# Patient Record
Sex: Female | Born: 1955 | Race: White | Hispanic: No | Marital: Single | Smoking: Never smoker
Health system: Southern US, Community
[De-identification: ages and names within clinical notes are randomized; demographics above are authoritative.]

## PROBLEM LIST (undated history)

## (undated) DIAGNOSIS — R4589 Other symptoms and signs involving emotional state: Secondary | ICD-10-CM

## (undated) DIAGNOSIS — C50919 Malignant neoplasm of unspecified site of unspecified female breast: Secondary | ICD-10-CM

## (undated) DIAGNOSIS — F418 Other specified anxiety disorders: Secondary | ICD-10-CM

## (undated) DIAGNOSIS — R918 Other nonspecific abnormal finding of lung field: Secondary | ICD-10-CM

## (undated) HISTORY — DX: Malignant neoplasm of unspecified site of unspecified female breast: C50.919

## (undated) HISTORY — PX: MASTECTOMY: SHX3

## (undated) HISTORY — PX: EYE SURGERY: SHX253

---

## 2014-04-29 ENCOUNTER — Encounter: Payer: Self-pay | Admitting: Internal Medicine

## 2014-04-29 ENCOUNTER — Encounter (INDEPENDENT_AMBULATORY_CARE_PROVIDER_SITE_OTHER): Payer: Self-pay

## 2014-04-29 ENCOUNTER — Ambulatory Visit (INDEPENDENT_AMBULATORY_CARE_PROVIDER_SITE_OTHER)
Admission: RE | Admit: 2014-04-29 | Discharge: 2014-04-29 | Disposition: A | Discharge status: Home / Self Care | Payer: Self-pay | Source: Ambulatory Visit | Attending: Internal Medicine | Admitting: Internal Medicine

## 2014-04-29 ENCOUNTER — Ambulatory Visit (INDEPENDENT_AMBULATORY_CARE_PROVIDER_SITE_OTHER): Payer: Self-pay | Admitting: Internal Medicine

## 2014-04-29 VITALS — BP 106/70 | HR 76 | Ht 62.0 in | Wt 106.0 lb

## 2014-04-29 DIAGNOSIS — R0600 Dyspnea, unspecified: Secondary | ICD-10-CM | POA: Insufficient documentation

## 2014-04-29 NOTE — Progress Notes (Signed)
Subjective:     Patient ID: Patricia Carter, female   DOB: 30-Jul-1955,    MRN: [id number]  HPI  65 yowf never smoker s/p L mastectomy 2010 no adjuvant therapy they recommended both but didn't complete it new onset cough 08/2013 indolent onset daily since [facility] dx bronchitis abx/ pred and no better and gradually worse so self referred  To pulmonary clinic 04/29/2014    04/29/2014 [facility]st [facility] office visit/ Teka Chanda   Chief Complaint  Patient presents with  . Pulmonary Consult    Self referral. Pt c/o SOB since May 2015. Pt states that she is SOB with or without any exertion- worse with "too much exercise" and anxiety.    indolent onset persistent daily cough x 8 m then sob x 6 m, last saba 12 h prior to OV  "couln't lie down s sense of choking" saba helps some  Not bringing up excess mucus and no h/o hemoptysis  No obvious day to day or daytime variabilty or assoc  cp or chest tightness, subjective wheeze overt sinus or hb symptoms. No unusual exp hx or h/o childhood pna/ asthma or knowledge of premature birth.  Sleeping ok without nocturnal  or early am exacerbation  of respiratory  c/o's or need for noct saba. Also denies any obvious fluctuation of symptoms with weather or environmental changes or other aggravating or alleviating factors except as outlined above   Current Medications, Allergies, Complete Past Medical History, Past Surgical History, Family History, and Social History were reviewed in [employer] record.  ROS  The following are not active complaints unless bolded sore throat, dysphagia, dental problems, itching, sneezing,  nasal congestion or excess/ purulent secretions, ear ache,   fever, chills, sweats, unintended wt loss, pleuritic or exertional cp, hemoptysis,  orthopnea pnd or leg swelling, presyncope, palpitations, heartburn, abdominal pain, anorexia, nausea, vomiting, diarrhea  or change in bowel or urinary habits, change in  stools or urine, dysuria,hematuria,  rash, arthralgias, visual complaints, headache, numbness weakness or ataxia or problems with walking or coordination,  change in mood/affect or memory.          Review of Systems     Objective:   Physical Exam  amb wf nad  pseudowheeze / large airway pattern   Wt Readings from Last 3 Encounters:  04/29/14 106 lb (48.081 kg)    Vital signs reviewed  HEENT: nl dentition, turbinates, and orophanx. Nl external ear canals without cough reflex   NECK :  without JVD/Nodes/TM/ nl carotid upstrokes bilaterally   LUNGS: no acc muscle use,  A few rhonchi esp R ant    CV:  RRR  no s3 or murmur or increase in P2, no edema   ABD:  soft and nontender with nl excursion in the supine position. No bruits or organomegaly, bowel sounds nl  MS:  warm without deformities, calf tenderness, cyanosis or clubbing  SKIN: warm and dry without lesions    NEURO:  alert, approp, no deficits    CXR PA and Lateral:   04/29/2014 :  Atelectatic changes right mid lung, close follow-up chest x-rays recommended to demonstrate clearing and to exclude an underlying central mass .         Assessment:

## 2014-04-29 NOTE — Patient Instructions (Addendum)
Try prilosec 20mg   Take 30-60 min before first meal of the day and Pepcid 20 mg one bedtime until  Return  For drainage take chlortrimeton (chlorpheniramine) 4 mg every 4 hours available over the counter (may cause drowsiness)   GERD (REFLUX)  is an extremely common cause of respiratory symptoms just like yours , many times with no obvious heartburn at all.    It can be treated with medication, but also with lifestyle changes including avoidance of late meals, excessive alcohol, smoking cessation, and avoid fatty foods, chocolate, peppermint, colas, red wine, and acidic juices such as orange juice.  NO MINT OR MENTHOL PRODUCTS SO NO COUGH DROPS  USE SUGARLESS CANDY INSTEAD (Jolley ranchers or Stover's or Life Savers) or even ice chips will also do - the key is to swallow to prevent all throat clearing. NO OIL BASED VITAMINS - use powdered substitutes.  Please remember to go to the x-ray department downstairs for your tests - we will call you with the results when they are available.     Please schedule a follow up office visit in 4 weeks, sooner if needed  Late add: cxr abn > proceed with ct with contrast

## 2014-04-29 NOTE — Progress Notes (Signed)
   Subjective:    Patient ID: Patricia Carter, female    DOB: May 23, 1955, 58 y.o.   MRN: [id number]  HPI    Review of Systems  Constitutional: Negative for fever, chills and unexpected weight change.  HENT: Negative for congestion, dental problem, ear pain, nosebleeds, postnasal drip, rhinorrhea, sinus pressure, sneezing, sore throat, trouble swallowing and voice change.   Eyes: Negative for visual disturbance.  Respiratory: Positive for cough and shortness of breath. Negative for choking.   Cardiovascular: Negative for chest pain and leg swelling.  Gastrointestinal: Negative for vomiting, abdominal pain and diarrhea.  Genitourinary: Negative for difficulty urinating.  Musculoskeletal: Negative for arthralgias.  Skin: Negative for rash.  Neurological: Negative for tremors, syncope and headaches.  Hematological: Does not bruise/bleed easily.       Objective:   Physical Exam        Assessment & Plan:

## 2014-04-30 ENCOUNTER — Other Ambulatory Visit: Payer: Self-pay | Admitting: Internal Medicine

## 2014-04-30 ENCOUNTER — Encounter: Payer: Self-pay | Admitting: Internal Medicine

## 2014-04-30 ENCOUNTER — Telehealth: Payer: Self-pay | Admitting: Internal Medicine

## 2014-04-30 ENCOUNTER — Ambulatory Visit (HOSPITAL_COMMUNITY)
Admission: RE | Admit: 2014-04-30 | Discharge: 2014-04-30 | Disposition: A | Discharge status: Home / Self Care | Payer: Self-pay | Source: Ambulatory Visit | Attending: Internal Medicine | Admitting: Internal Medicine

## 2014-04-30 DIAGNOSIS — R918 Other nonspecific abnormal finding of lung field: Secondary | ICD-10-CM | POA: Insufficient documentation

## 2014-04-30 DIAGNOSIS — R0602 Shortness of breath: Secondary | ICD-10-CM | POA: Insufficient documentation

## 2014-04-30 DIAGNOSIS — C50912 Malignant neoplasm of unspecified site of left female breast: Secondary | ICD-10-CM | POA: Insufficient documentation

## 2014-04-30 DIAGNOSIS — R0600 Dyspnea, unspecified: Secondary | ICD-10-CM

## 2014-04-30 DIAGNOSIS — R938 Abnormal findings on diagnostic imaging of other specified body structures: Secondary | ICD-10-CM | POA: Insufficient documentation

## 2014-04-30 DIAGNOSIS — R9389 Abnormal findings on diagnostic imaging of other specified body structures: Secondary | ICD-10-CM

## 2014-04-30 LAB — POCT I-STAT CREATININE: Creatinine, Ser: 1.1 mg/dL (ref 0.50–1.10)

## 2014-04-30 MED ORDER — IOHEXOL 350 MG/ML SOLN
100.0000 mL | Freq: Once | INTRAVENOUS | Status: AC | PRN
  Administered 2014-04-30: 80 mL via INTRAVENOUS

## 2014-04-30 NOTE — Telephone Encounter (Signed)
Call to arrange for CT chest with contrast asap eval lung density

## 2014-04-30 NOTE — Telephone Encounter (Signed)
Order has been fixed and libby is aware and she will call the hospital to have this done.  Nothing further is needed.

## 2014-04-30 NOTE — Assessment & Plan Note (Addendum)
-  04/29/2014  Walked RA x 3 laps @ 185 ft each stopped due to  End of study no desat min sob  - 04/29/2014 spirometry abn f/v contour but min obstruction off saba x 12 h - 112/30/15 cxr with R hilar mass   Not able to reproduced sob at rest with any problem walking but she clearly has large airway rhonchi on exam and abn cxr and given h/o breast ca which is known to met to airways need   to proceed with CT chest and in meantime rx for GERD/ pnds as other causes for upper airway wheeze

## 2014-04-30 NOTE — Telephone Encounter (Signed)
MW please advise of cxr results from yesterday thanks

## 2014-04-30 NOTE — Telephone Encounter (Signed)
Yes does need bmet if not in care everywhere - check there first. I told her there was a density on the xray and did not look like pna or any kind of typical form of tumor/lung ca so needs CT to sort out

## 2014-04-30 NOTE — Telephone Encounter (Signed)
MW please advise if pt will need BMP prior to CT scan.  And what can be told to the pt about her cxr results.  Thanks

## 2014-04-30 NOTE — Telephone Encounter (Signed)
Ct findings below   - d/w Dr Melvyn Novas who saw patient earlier in office  Plan A - rec - direct admit to [facility] for cards/cvts consult - patient informed and given results. Patient adamantly refused admission because "I am not that short of breath". She also has no family locally , rest in [place], [place] and a friend brought her here. Says she has pets to take care of. She gave the phone off to her friend  Plan B advised given her refusal - no admission now  - call office Monday 05/14/14 : [phone] - and ask for Dr wert  - in interim, if dyspneic go to ER - prefer [facility] or call answering [place] and ask for service  Given friend and her dx  - pericardial effusion  - lung mass - cause of above unknown explained       Dr. Brand Males, M.D., [facility].C.P Pulmonary and Critical Care Medicine Staff Physician [facility] Pager: [phone], If no answer or between  15:00h - 7:00h: call [phone]  04/30/2014 5:57 PM        Dg Chest 2 View  04/29/2014   CLINICAL DATA:  Shortness of breath x8 months. History of breast cancer.  EXAM: CHEST  2 VIEW  COMPARISON:  None.  FINDINGS: Mediastinum is unremarkable. Right midlung atelectatic changes are present. Underlying right scratch mass cannot be excluded. Mild blunting of both costophrenic angles. Tiny effusions cannot be excluded. No pneumothorax. Mild cardiomegaly with normal pulmonary vascularity. Postsurgical changes left breast. No focal bony abnormality.  IMPRESSION: Atelectatic changes right mid lung, close follow-up chest x-rays recommended to demonstrate clearing and to exclude an underlying central mass .   Electronically Signed   By: Marcello Moores  Register   On: 04/29/2014 15:10   Ct Angio Chest W/cm &/or Wo Cm  04/30/2014   CLINICAL DATA:  Shortness of breath, abnormal chest radiograph. Left breast cancer.  EXAM: CT ANGIOGRAPHY CHEST WITH CONTRAST  TECHNIQUE: Multidetector CT imaging of the  chest was performed using the standard protocol during bolus administration of intravenous contrast. Multiplanar CT image reconstructions and MIPs were obtained to evaluate the vascular anatomy.  CONTRAST:  61mL OMNIPAQUE IOHEXOL 350 MG/ML SOLN  COMPARISON:  Chest radiograph 04/29/2014.  FINDINGS: Right upper lobe pulmonary arteries are attenuated due to compression by a right hilar mass. No filling defects in the pulmonary arteries to indicate pulmonary embolus.  There is a right hilar mass, which is contiguous with subcarinal adenopathy, measuring approximately 3.5 x 6.6 cm. Slight narrowing of the lower SVC is seen with mass effect on the distal right main pulmonary artery and its branches, causing attenuation in the right upper lobe. The bronchus intermedius is narrowed. The right middle lobe bronchi are obstructed. Moderate pericardial effusion. Heart size normal. No axillary adenopathy.  Mild biapical pleural parenchymal scarring. There is masslike collapse/ consolidation in the right middle lobe. Masslike portion measures approximately 3.6 x 3.6 cm. 8 mm right lower lobe nodule (image 49). Scattered small nodular and ground-glass opacities in the right upper lobe. Linear scarring or atelectasis in the left lower lobe. Left lung is otherwise clear. Airway is otherwise unremarkable. A low-attenuation extrapleural nodule along the right lateral aspect of the mid thoracic spine (series 4, image 63) measures 1.0 x 1.3 cm and may represent a small nerve sheath tumor.  Incidental imaging of the upper abdomen shows the visualized portions of the liver, gallbladder, adrenal  glands and left kidney to be grossly unremarkable. Heterogeneity within the spleen may be due to phase of contrast. Difficult to definitively exclude true nodules within the spleen. Visualized portions of the pancreas and stomach are grossly unremarkable.  No worrisome lytic or sclerotic lesions.  Review of the MIP images confirms the above  findings.  IMPRESSION: 1. Negative for pulmonary embolus. 2. Right hilar mass with compression of the SVC and right upper lobe pulmonary artery and obstruction of the right middle lobe bronchus. Right middle lobe mass and postobstructive collapse. Findings are highly worrisome for primary bronchogenic carcinoma. 3. Possible satellite right lower lobe nodule. 4. Moderate pericardial effusion. 5. Scattered small nodular and ground-glass opacities in the right upper lobe, likely infectious or inflammatory in etiology.   Electronically Signed   By: Lorin Picket M.D.   On: 04/30/2014 16:48

## 2014-05-04 ENCOUNTER — Encounter (HOSPITAL_COMMUNITY): Payer: Self-pay | Admitting: *Deleted

## 2014-05-04 ENCOUNTER — Telehealth: Payer: Self-pay | Admitting: Internal Medicine

## 2014-05-04 ENCOUNTER — Inpatient Hospital Stay (HOSPITAL_COMMUNITY)
Admission: AD | Admit: 2014-05-04 | Discharge: 2014-05-09 | DRG: 271 | Disposition: A | Discharge status: Home / Self Care | Payer: Medicaid Other | Source: Ambulatory Visit | Attending: Surgery | Admitting: Surgery

## 2014-05-04 DIAGNOSIS — J939 Pneumothorax, unspecified: Secondary | ICD-10-CM

## 2014-05-04 DIAGNOSIS — Z853 Personal history of malignant neoplasm of breast: Secondary | ICD-10-CM | POA: Diagnosis not present

## 2014-05-04 DIAGNOSIS — R0600 Dyspnea, unspecified: Secondary | ICD-10-CM | POA: Diagnosis present

## 2014-05-04 DIAGNOSIS — I3139 Other pericardial effusion (noninflammatory): Secondary | ICD-10-CM

## 2014-05-04 DIAGNOSIS — F419 Anxiety disorder, unspecified: Secondary | ICD-10-CM | POA: Diagnosis present

## 2014-05-04 DIAGNOSIS — I313 Pericardial effusion (noninflammatory): Secondary | ICD-10-CM | POA: Diagnosis present

## 2014-05-04 DIAGNOSIS — Z9689 Presence of other specified functional implants: Secondary | ICD-10-CM

## 2014-05-04 DIAGNOSIS — J9811 Atelectasis: Secondary | ICD-10-CM | POA: Diagnosis present

## 2014-05-04 DIAGNOSIS — C7801 Secondary malignant neoplasm of right lung: Secondary | ICD-10-CM | POA: Diagnosis present

## 2014-05-04 DIAGNOSIS — I871 Compression of vein: Secondary | ICD-10-CM | POA: Diagnosis present

## 2014-05-04 DIAGNOSIS — I309 Acute pericarditis, unspecified: Secondary | ICD-10-CM | POA: Diagnosis present

## 2014-05-04 DIAGNOSIS — R590 Localized enlarged lymph nodes: Secondary | ICD-10-CM

## 2014-05-04 DIAGNOSIS — R599 Enlarged lymph nodes, unspecified: Secondary | ICD-10-CM | POA: Diagnosis present

## 2014-05-04 DIAGNOSIS — Z9012 Acquired absence of left breast and nipple: Secondary | ICD-10-CM | POA: Diagnosis present

## 2014-05-04 DIAGNOSIS — R918 Other nonspecific abnormal finding of lung field: Secondary | ICD-10-CM

## 2014-05-04 DIAGNOSIS — R222 Localized swelling, mass and lump, trunk: Secondary | ICD-10-CM

## 2014-05-04 LAB — COMPREHENSIVE METABOLIC PANEL
ALT: 10 U/L (ref 0–35)
AST: 23 U/L (ref 0–37)
Albumin: 4 g/dL (ref 3.5–5.2)
Alkaline Phosphatase: 103 U/L (ref 39–117)
Anion gap: 7 (ref 5–15)
BILIRUBIN TOTAL: 0.3 mg/dL (ref 0.3–1.2)
BUN: 14 mg/dL (ref 6–23)
CHLORIDE: 105 meq/L (ref 96–112)
CO2: 28 mmol/L (ref 19–32)
Calcium: 9.3 mg/dL (ref 8.4–10.5)
Creatinine, Ser: 0.95 mg/dL (ref 0.50–1.10)
GFR calc Af Amer: 74 mL/min — ABNORMAL LOW (ref >90)
GFR, EST NON AFRICAN AMERICAN: 64 mL/min — AB (ref >90)
Glucose, Bld: 124 mg/dL — ABNORMAL HIGH (ref 70–99)
Potassium: 4 mmol/L (ref 3.5–5.1)
SODIUM: 140 mmol/L (ref 135–145)
Total Protein: 6.4 g/dL (ref 6.0–8.3)

## 2014-05-04 LAB — CBC WITH DIFFERENTIAL/PLATELET
BASOS PCT: 0 % (ref 0–1)
Basophils Absolute: 0 10*3/uL (ref 0.0–0.1)
Eosinophils Absolute: 0.2 10*3/uL (ref 0.0–0.7)
Eosinophils Relative: 3 % (ref 0–5)
HCT: 41.8 % (ref 36.0–46.0)
Hemoglobin: 14.3 g/dL (ref 12.0–15.0)
Lymphocytes Relative: 19 % (ref 12–46)
Lymphs Abs: 1.1 10*3/uL (ref 0.7–4.0)
MCH: 29.8 pg (ref 26.0–34.0)
MCHC: 34.2 g/dL (ref 30.0–36.0)
MCV: 87.1 fL (ref 78.0–100.0)
MONOS PCT: 11 % (ref 3–12)
Monocytes Absolute: 0.6 10*3/uL (ref 0.1–1.0)
NEUTROS ABS: 3.7 10*3/uL (ref 1.7–7.7)
Neutrophils Relative %: 67 % (ref 43–77)
Platelets: 208 10*3/uL (ref 150–400)
RBC: 4.8 MIL/uL (ref 3.87–5.11)
RDW: 13 % (ref 11.5–15.5)
WBC: 5.6 10*3/uL (ref 4.0–10.5)

## 2014-05-04 LAB — SEDIMENTATION RATE: Sed Rate: 7 mm/hr (ref 0–22)

## 2014-05-04 LAB — TYPE AND SCREEN
ABO/RH(D): O POS
Antibody Screen: NEGATIVE

## 2014-05-04 LAB — APTT: APTT: 31 s (ref 24–37)

## 2014-05-04 LAB — TSH: TSH: 3.025 u[IU]/mL (ref 0.350–4.500)

## 2014-05-04 LAB — PROTIME-INR
INR: 0.97 (ref 0.00–1.49)
Prothrombin Time: 12.9 seconds (ref 11.6–15.2)

## 2014-05-04 MED ORDER — ALPRAZOLAM 0.25 MG PO TABS
0.2500 mg | ORAL_TABLET | Freq: Three times a day (TID) | ORAL | Status: DC | PRN
  Administered 2014-05-06 – 2014-05-08 (×3): 0.25 mg via ORAL
  Filled 2014-05-04 (×3): qty 1

## 2014-05-04 NOTE — Progress Notes (Signed)
Pt admitted from MD office; pt very anxious and tearful about hospital admission; IV started; BP slightly elevated; pt very anxious about BP; will recheck BP after pt settled in room; will cont. To monitor.

## 2014-05-04 NOTE — H&P (Addendum)
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 Negative for pulmonary embolus. 2. Right hilar mass with compression of the SVC and right upper lobe pulmonary artery and obstruction of the right middle lobe bronchus. Right middle lobe mass and postobstructive collapse. Findings are highly worrisome for primary bronchogenic carcinoma. 3. Possible satellite right lower lobe nodule. 4. Moderate pericardial effusion. 5. Scattered small nodular and ground-glass opacities in the right upper lobe, likely infectious or inflammatory in etiology - 2d ech 05/04/13 >>   SIGNIFICANT EVENTS: T surg consultation requested 05/04/14 for ? Window?   HISTORY OF PRESENT ILLNESS:    46 yowf never smoker s/p L mastectomy 2010 no adjuvant therapy they recommended both but didn't complete it new onset cough 08/2013 indolent onset daily since [facility] bronchitis abx/ pred and no better and gradually worse so self referred To pulmonary clinic 04/29/2014    indolent onset persistent daily cough x 8 m then sob x 6 m, last saba 12 h prior to OV "couln't lie down s sense of choking" saba helps some  Not bringing up excess mucus and no h/o hemoptysis  No obvious day to day or daytime variabilty or assoc cp or chest tightness, subjective wheeze overt sinus or hb symptoms. No unusual exp hx or h/o childhood pna/ asthma or knowledge of premature birth.  Sleeping ok without  nocturnal or early am exacerbation of respiratory c/o's or need for noct saba. Also denies any obvious fluctuation of symptoms with weather or environmental changes or other aggravating or alleviating factors except as outlined above   Current Medications, Allergies, Complete Past Medical History, Past Surgical History, Family History, and Social History were reviewed in [employer] record.  ROS The following are not active complaints unless bolded sore throat, dysphagia, dental problems, itching, sneezing, nasal congestion or excess/ purulent secretions, ear ache, fever, chills, sweats, unintended wt loss, pleuritic or exertional cp, hemoptysis, orthopnea pnd or leg swelling, presyncope, palpitations, heartburn, abdominal pain, anorexia, nausea, vomiting, diarrhea or change in bowel or urinary habits, change in stools or urine, dysuria,hematuria, rash, arthralgias, visual complaints, headache, numbness weakness or ataxia or problems with walking or coordination, change in mood/affect or memory.   PAST MEDICAL HISTORY :   has a past medical history of Breast cancer.  has past surgical history that includes Mastectomy (Left) and Eye surgery. Prior to Admission medications   Medication Sig Start Date End Date Taking? Authorizing Provider  Naproxen Sodium (ALEVE PO) Take 1 tablet by mouth 2 (two) times daily as needed.    Historical Provider, MD   Allergies  Allergen Reactions  . Prednisone Palpitations  . Zyrtec [Cetirizine] Palpitations    FAMILY HISTORY:  has no family status information on file.  SOCIAL HISTORY:  reports that she has never smoked. She has never used smokeless tobacco. She reports that she drinks alcohol. She reports that she does not use illicit drugs.  SUBJECTIVE:  Anxious but amb / nad at rest RA  VITAL SIGNS: Temp:  [97.8 F (36.6 C)] 97.8 F (36.6 C) (01/04 1524) Pulse Rate:  [80] 80 (01/04 1524) Resp:  [18] 18 (01/04  1524) BP: (142)/(91) 142/91 mmHg (01/04 1524) SpO2:  [100 %] 100 % (01/04 1524) Weight:  [106 lb (48.081 kg)] 106 lb (48.081 kg) (01/04 1549) HEMODYNAMICS:   VENTILATOR SETTINGS:   INTAKE / OUTPUT: No intake or output data in the 24 hours ending 05/04/14 1905  PHYSICAL EXAMINATION: General: thin anxious wf nad upper airway "wheeze" louder on R   HEENT: nl dentition, turbinates, and orophanx. Nl external ear canals without cough reflex   NECK :  without JVD/Nodes/TM/ nl carotid upstrokes bilaterally   LUNGS: no acc muscle use,  Wheeze on R anteriorly   CV:  RRR  no s3 or murmur or increase in P2, no edema   ABD:  soft and nontender with nl excursion in the supine position. No bruits or organomegaly, bowel sounds nl  MS:  warm without deformities, calf tenderness, cyanosis or clubbing  SKIN: warm and dry without lesions    NEURO:  alert, approp, no deficits      LABS:  CBC  Recent Labs Lab 05/04/14 1725  WBC 5.6  HGB 14.3  HCT 41.8  PLT 208   Coag's  Recent Labs Lab 05/04/14 1725  APTT 31  INR 0.97   BMET  Recent Labs Lab 04/30/14 1615 05/04/14 1725  NA  --  140  K  --  4.0  CL  --  105  CO2  --  28  BUN  --  14  CREATININE 1.10 0.95  GLUCOSE  --  124*   Electrolytes  Recent Labs Lab 05/04/14 1725  CALCIUM 9.3   Sepsis Markers No results for input(s): LATICACIDVEN, PROCALCITON, O2SATVEN in the last 168 hours. ABG No results for input(s): PHART, PCO2ART, PO2ART in the last 168 hours. Liver Enzymes  Recent Labs Lab 05/04/14 1725  AST 23  ALT 10  ALKPHOS 103  BILITOT 0.3  ALBUMIN 4.0   Cardiac Enzymes No results for input(s): TROPONINI, PROBNP in the last 168 hours. Glucose No results for input(s): GLUCAP in the last 168 hours.  Imaging No results found.   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 Needs window - Echo pending > if threatened tamponade would do window, if not ? Fob 1/5 so keep npo either way p MN       Christinia Gully, MD Pulmonary and [facility] [phone] After 5:30 PM or weekends, call [phone]

## 2014-05-04 NOTE — Telephone Encounter (Signed)
Discussed with pt > admit [facility] for T surgery eval

## 2014-05-04 NOTE — Telephone Encounter (Signed)
Pt spoke with Dr. Chase Caller about CT results on 04-30-14. Pt states that she was very confused and overwhelmed so she refused admit at that time. She states she was advised to call the office on Monday to discuss what needs to be done. Dr. Melvyn Novas please advise what we need to do for this pt? Bodega Bing, CMA

## 2014-05-04 NOTE — Progress Notes (Signed)
Pt up ambulating in hallway at this time with friend; will cont. To monitor.

## 2014-05-04 NOTE — Consult Note (Signed)
[address]       [place] [address]             [phone]      Cardiothoracic Surgery Consultation  Reason for Consult: Large pericardial effusion, Right hilar lung mass with RML bronchial obstruction, mediastinal adenopathy Referring Physician: Dr. Christinia Gully  Patricia Carter is an 59 y.o. female.  HPI:   The patient is a 59 year old non-smoker with a history of left breast cancer, s/p left mastectomy in 2010 with bilateral reconstruction. She reports developing a cough in May 2015 that would not go away and she was treated at [facility] urgent care and then at [facility] in [place] for presumed bronchitis. She received antibiotics and prednisone and was not improved and continued to worsen. Her CXR in August 2015 reportedly showed a RUL pulmonary nodule and a CT was recommended but not done and she says she did not follow up there. She has had progressive shortness of breath and cough and was self-referred to pulmonary clinic on 04/29/2014. A CT of the chest shows a large right hilar mass with obstruction of the RML bronchi and narrowing of the bronchus intermedius. There is extensive mediastinal adenopathy particularly in the subcarinal area. There is a large pericardial effusion. She was admitted by Dr. Melvyn Novas to expedite workup.  Past Medical History  Diagnosis Date  . Breast cancer     Past Surgical History  Procedure Laterality Date  . Mastectomy Left     2010  . Eye surgery      cataract surgery, wears contact left eye    History reviewed. No pertinent family history.  Social History:  reports that she has never smoked. She has never used smokeless tobacco. She reports that she drinks alcohol. She reports that she does not use illicit drugs.  Allergies:  Allergies  Allergen Reactions  . Prednisone Palpitations  . Zyrtec [Cetirizine] Palpitations    Medications:  I have reviewed the patient's current medications. Prior to Admission:    Prescriptions prior to admission  Medication Sig Dispense Refill Last Dose  . acetaminophen (TYLENOL) 325 MG tablet Take 650 mg by mouth every 6 (six) hours as needed for headache.   Past Month at Unknown time  . Naproxen Sodium (ALEVE PO) Take 1 tablet by mouth 2 (two) times daily as needed (headache).    Past Week at Unknown time   Scheduled:  Continuous:  JJH:[id number] Anti-infectives    None      Results for orders placed or performed during the hospital encounter of 05/04/14 (from the past 48 hour(s))  Comprehensive metabolic panel     Status: Abnormal   Collection Time: 05/04/14  5:25 PM  Result Value Ref Range   Sodium 140 135 - 145 mmol/L    Comment: Please note change in reference range.   Potassium 4.0 3.5 - 5.1 mmol/L    Comment: Please note change in reference range.   Chloride 105 96 - 112 mEq/L   CO2 28 19 - 32 mmol/L   Glucose, Bld 124 (H) 70 - 99 mg/dL   BUN 14 6 - 23 mg/dL   Creatinine, Ser 0.95 0.50 - 1.10 mg/dL   Calcium 9.3 8.4 - 10.5 mg/dL   Total Protein 6.4 6.0 - 8.3 g/dL   Albumin 4.0 3.5 - 5.2 g/dL   AST 23 0 - 37 U/L   ALT 10 0 - 35 U/L   Alkaline Phosphatase 103 39 - 117  U/L   Total Bilirubin 0.3 0.3 - 1.2 mg/dL   GFR calc non Af Amer 64 (L) >90 mL/min   GFR calc Af Amer 74 (L) >90 mL/min    Comment: (NOTE) The eGFR has been calculated using the CKD EPI equation. This calculation has not been validated in all clinical situations. eGFR's persistently <90 mL/min signify possible Chronic Kidney Disease.    Anion gap 7 5 - 15  CBC WITH DIFFERENTIAL     Status: None   Collection Time: 05/04/14  5:25 PM  Result Value Ref Range   WBC 5.6 4.0 - 10.5 K/uL   RBC 4.80 3.87 - 5.11 MIL/uL   Hemoglobin 14.3 12.0 - 15.0 g/dL   HCT 41.8 36.0 - 46.0 %   MCV 87.1 78.0 - 100.0 fL   MCH 29.8 26.0 - 34.0 pg   MCHC 34.2 30.0 - 36.0 g/dL   RDW 13.0 11.5 - 15.5 %   Platelets 208 150 - 400 K/uL   Neutrophils Relative % 67 43 - 77 %   Neutro Abs 3.7 1.7 -  7.7 K/uL   Lymphocytes Relative 19 12 - 46 %   Lymphs Abs 1.1 0.7 - 4.0 K/uL   Monocytes Relative 11 3 - 12 %   Monocytes Absolute 0.6 0.1 - 1.0 K/uL   Eosinophils Relative 3 0 - 5 %   Eosinophils Absolute 0.2 0.0 - 0.7 K/uL   Basophils Relative 0 0 - 1 %   Basophils Absolute 0.0 0.0 - 0.1 K/uL  APTT     Status: None   Collection Time: 05/04/14  5:25 PM  Result Value Ref Range   aPTT 31 24 - 37 seconds  Protime-INR     Status: None   Collection Time: 05/04/14  5:25 PM  Result Value Ref Range   Prothrombin Time 12.9 11.6 - 15.2 seconds   INR 0.97 0.00 - 1.49  TSH     Status: None   Collection Time: 05/04/14  5:25 PM  Result Value Ref Range   TSH 3.025 0.350 - 4.500 uIU/mL  Sedimentation rate     Status: None   Collection Time: 05/04/14  5:25 PM  Result Value Ref Range   Sed Rate 7 0 - 22 mm/hr    No results found.  Review of Systems  Constitutional: Positive for malaise/fatigue. Negative for fever, chills and weight loss.  HENT: Negative.   Eyes: Negative.   Respiratory: Positive for cough, sputum production, shortness of breath and wheezing. Negative for hemoptysis.   Cardiovascular: Positive for orthopnea. Negative for chest pain and leg swelling.  Gastrointestinal: Negative.   Genitourinary: Negative.   Musculoskeletal: Negative.   Skin: Negative.   Neurological: Negative.  Negative for weakness.  Endo/Heme/Allergies: Negative.   Psychiatric/Behavioral: Negative.    Blood pressure 142/91, pulse 80, temperature 97.8 F (36.6 C), temperature source Oral, resp. rate 18, height 5' 3"  (1.6 m), weight 48.081 kg (106 lb), SpO2 100 %. Physical Exam  Constitutional: She is oriented to person, place, and time. She appears well-developed and well-nourished. No distress.  HENT:  Head: Normocephalic and atraumatic.  Mouth/Throat: Oropharynx is clear and moist.  Eyes: EOM are normal. Pupils are equal, round, and reactive to light.  Neck: Normal range of motion. Neck supple. No  JVD present. No tracheal deviation present. No thyromegaly present.  Cardiovascular: Normal rate, regular rhythm, normal heart sounds and intact distal pulses.   No murmur heard. Respiratory: Effort normal and breath sounds normal. No respiratory distress.  She exhibits no tenderness.  Slight wheeze on the right Left mastectomy and bilateral breast implants  GI: Soft. Bowel sounds are normal. She exhibits no distension and no mass. There is no tenderness.  Musculoskeletal: Normal range of motion. She exhibits no edema.  Lymphadenopathy:    She has no cervical adenopathy.  Neurological: She is alert and oriented to person, place, and time. She has normal strength. No cranial nerve deficit or sensory deficit.  Skin: Skin is warm and dry.  Psychiatric: She has a normal mood and affect.  Very anxious about her diagnosis.   CLINICAL DATA: Shortness of breath, abnormal chest radiograph. Left breast cancer.  EXAM: CT ANGIOGRAPHY CHEST WITH CONTRAST  TECHNIQUE: Multidetector CT imaging of the chest was performed using the standard protocol during bolus administration of intravenous contrast. Multiplanar CT image reconstructions and MIPs were obtained to evaluate the vascular anatomy.  CONTRAST: 24m OMNIPAQUE IOHEXOL 350 MG/ML SOLN  COMPARISON: Chest radiograph 04/29/2014.  FINDINGS: Right upper lobe pulmonary arteries are attenuated due to compression by a right hilar mass. No filling defects in the pulmonary arteries to indicate pulmonary embolus.  There is a right hilar mass, which is contiguous with subcarinal adenopathy, measuring approximately 3.5 x 6.6 cm. Slight narrowing of the lower SVC is seen with mass effect on the distal right main pulmonary artery and its branches, causing attenuation in the right upper lobe. The bronchus intermedius is narrowed. The right middle lobe bronchi are obstructed. Moderate pericardial effusion. Heart size normal. No axillary  adenopathy.  Mild biapical pleural parenchymal scarring. There is masslike collapse/ consolidation in the right middle lobe. Masslike portion measures approximately 3.6 x 3.6 cm. 8 mm right lower lobe nodule (image 49). Scattered small nodular and ground-glass opacities in the right upper lobe. Linear scarring or atelectasis in the left lower lobe. Left lung is otherwise clear. Airway is otherwise unremarkable. A low-attenuation extrapleural nodule along the right lateral aspect of the mid thoracic spine (series 4, image 63) measures 1.0 x 1.3 cm and may represent a small nerve sheath tumor.  Incidental imaging of the upper abdomen shows the visualized portions of the liver, gallbladder, adrenal glands and left kidney to be grossly unremarkable. Heterogeneity within the spleen may be due to phase of contrast. Difficult to definitively exclude true nodules within the spleen. Visualized portions of the pancreas and stomach are grossly unremarkable.  No worrisome lytic or sclerotic lesions.  Review of the MIP images confirms the above findings.  IMPRESSION: 1. Negative for pulmonary embolus. 2. Right hilar mass with compression of the SVC and right upper lobe pulmonary artery and obstruction of the right middle lobe bronchus. Right middle lobe mass and postobstructive collapse. Findings are highly worrisome for primary bronchogenic carcinoma. 3. Possible satellite right lower lobe nodule. 4. Moderate pericardial effusion. 5. Scattered small nodular and ground-glass opacities in the right upper lobe, likely infectious or inflammatory in etiology.   Electronically Signed  By: MLorin PicketM.D.  On: 04/30/2014 16:48   Assessment/Plan:  She has a large right hilar mass extending into the mediastinum into the subcarinal region that is narrowing the right pulmonary artery and compressing the SVC somewhat. There is obstruction of the RML bronchus and a large density in  the RML that could be tumor or collapsed lung. There is a satellite RLL nodule and scattered small nodular and ground-glass opacities in the RUL. There is a large pericardial effusion. I think this is a malignant process and could be recurrent breast cancer  or lung cancer. I think it would be best to proceed with a subxyphoid pericardial window for diagnostic and therapeutic purposes as well as bronchoscopy and EBUS biopsy of the subcarinal and/or hilar lymph nodes to make a diagnosis. I reviewed the CT scan with her and her friend in the room and discussed my suspicion that this is a malignant and metastatic process. I discussed the surgical procedure with her including alternatives, benefits and risks including but not limited to bleeding, infection, pneumothorax, ventral hernia, and recurrence of the pericardial effusion. She understands and agrees to proceed. I will do tomorrow late morning. She would like to go home to [facility] as soon as possible afterwards and receive any further treatment there.   Gaye Pollack 05/04/2014, 7:34 PM

## 2014-05-05 ENCOUNTER — Inpatient Hospital Stay (HOSPITAL_COMMUNITY): Payer: Medicaid Other | Admitting: Certified Registered Nurse Anesthetist

## 2014-05-05 ENCOUNTER — Encounter (HOSPITAL_COMMUNITY)
Admission: AD | Disposition: A | Discharge status: Home / Self Care | Payer: Self-pay | Source: Ambulatory Visit | Attending: Surgery

## 2014-05-05 ENCOUNTER — Inpatient Hospital Stay (HOSPITAL_COMMUNITY): Payer: Medicaid Other

## 2014-05-05 DIAGNOSIS — I313 Pericardial effusion (noninflammatory): Secondary | ICD-10-CM

## 2014-05-05 DIAGNOSIS — R918 Other nonspecific abnormal finding of lung field: Secondary | ICD-10-CM

## 2014-05-05 DIAGNOSIS — R590 Localized enlarged lymph nodes: Secondary | ICD-10-CM

## 2014-05-05 HISTORY — PX: FLEXIBLE BRONCHOSCOPY: SHX5094

## 2014-05-05 HISTORY — PX: VIDEO BRONCHOSCOPY WITH ENDOBRONCHIAL ULTRASOUND: SHX6177

## 2014-05-05 HISTORY — PX: SUBXYPHOID PERICARDIAL WINDOW: SHX5075

## 2014-05-05 LAB — ABO/RH: ABO/RH(D): O POS

## 2014-05-05 LAB — GLUCOSE, CAPILLARY
GLUCOSE-CAPILLARY: 114 mg/dL — AB (ref 70–99)
GLUCOSE-CAPILLARY: 87 mg/dL (ref 70–99)

## 2014-05-05 LAB — MRSA PCR SCREENING: MRSA BY PCR: NEGATIVE

## 2014-05-05 SURGERY — CREATION, PERICARDIAL WINDOW, SUBXIPHOID APPROACH
Anesthesia: General

## 2014-05-05 MED ORDER — MIDAZOLAM HCL 2 MG/2ML IJ SOLN
INTRAMUSCULAR | Status: AC
  Filled 2014-05-05: qty 2

## 2014-05-05 MED ORDER — MIDAZOLAM HCL 5 MG/5ML IJ SOLN
INTRAMUSCULAR | Status: DC | PRN
  Administered 2014-05-05: 2 mg via INTRAVENOUS

## 2014-05-05 MED ORDER — LACTATED RINGERS IV SOLN
INTRAVENOUS | Status: DC
  Administered 2014-05-05: 22:00:00 via INTRAVENOUS
  Administered 2014-05-06: 75 mL/h via INTRAVENOUS
  Administered 2014-05-06 – 2014-05-07 (×2): via INTRAVENOUS

## 2014-05-05 MED ORDER — LIDOCAINE HCL (CARDIAC) 20 MG/ML IV SOLN
INTRAVENOUS | Status: AC
  Filled 2014-05-05: qty 5

## 2014-05-05 MED ORDER — ONDANSETRON HCL 4 MG/2ML IJ SOLN: 4.0000 mg | Freq: Four times a day (QID) | INTRAMUSCULAR | Status: DC | PRN

## 2014-05-05 MED ORDER — LACTATED RINGERS IV SOLN
INTRAVENOUS | Status: DC | PRN
Start: 2014-05-05 — End: 2014-05-05
  Administered 2014-05-05: 10:00:00 via INTRAVENOUS

## 2014-05-05 MED ORDER — LEVALBUTEROL HCL 0.63 MG/3ML IN NEBU
0.6300 mg | INHALATION_SOLUTION | Freq: Four times a day (QID) | RESPIRATORY_TRACT | Status: DC | PRN
Start: 2014-05-05 — End: 2014-05-09

## 2014-05-05 MED ORDER — KETOROLAC TROMETHAMINE 15 MG/ML IJ SOLN
15.0000 mg | Freq: Four times a day (QID) | INTRAMUSCULAR | Status: DC | PRN
  Administered 2014-05-05 – 2014-05-08 (×6): 15 mg via INTRAVENOUS
  Filled 2014-05-05 (×6): qty 1

## 2014-05-05 MED ORDER — OXYCODONE HCL 5 MG PO TABS
5.0000 mg | ORAL_TABLET | ORAL | Status: DC | PRN
  Administered 2014-05-08 – 2014-05-09 (×2): 5 mg via ORAL
  Filled 2014-05-05 (×2): qty 1

## 2014-05-05 MED ORDER — BISACODYL 5 MG PO TBEC
10.0000 mg | DELAYED_RELEASE_TABLET | Freq: Every day | ORAL | Status: DC
  Administered 2014-05-06: 10 mg via ORAL
  Filled 2014-05-05: qty 2

## 2014-05-05 MED ORDER — ROCURONIUM BROMIDE 50 MG/5ML IV SOLN
INTRAVENOUS | Status: AC
  Filled 2014-05-05: qty 1

## 2014-05-05 MED ORDER — 0.9 % SODIUM CHLORIDE (POUR BTL) OPTIME
TOPICAL | Status: DC | PRN
  Administered 2014-05-05: 1000 mL

## 2014-05-05 MED ORDER — HYDROMORPHONE HCL 1 MG/ML IJ SOLN
INTRAMUSCULAR | Status: AC
  Filled 2014-05-05: qty 1

## 2014-05-05 MED ORDER — SENNOSIDES-DOCUSATE SODIUM 8.6-50 MG PO TABS
1.0000 | ORAL_TABLET | Freq: Every day | ORAL | Status: DC
  Filled 2014-05-05 (×5): qty 1

## 2014-05-05 MED ORDER — NEOSTIGMINE METHYLSULFATE 10 MG/10ML IV SOLN
INTRAVENOUS | Status: DC | PRN
  Administered 2014-05-05: 3 mg via INTRAVENOUS

## 2014-05-05 MED ORDER — CEFUROXIME SODIUM 1.5 G IJ SOLR
1.5000 g | Freq: Three times a day (TID) | INTRAMUSCULAR | Status: DC
  Administered 2014-05-05: 1.5 g via INTRAMUSCULAR
  Filled 2014-05-05 (×3): qty 1.5

## 2014-05-05 MED ORDER — POTASSIUM CHLORIDE 10 MEQ/50ML IV SOLN: 10.0000 meq | Freq: Every day | INTRAVENOUS | Status: DC | PRN

## 2014-05-05 MED ORDER — MORPHINE SULFATE 2 MG/ML IJ SOLN
2.0000 mg | INTRAMUSCULAR | Status: DC | PRN
  Administered 2014-05-05 – 2014-05-06 (×8): 2 mg via INTRAVENOUS
  Filled 2014-05-05 (×8): qty 1

## 2014-05-05 MED ORDER — ACETAMINOPHEN 500 MG PO TABS
1000.0000 mg | ORAL_TABLET | Freq: Four times a day (QID) | ORAL | Status: DC
  Administered 2014-05-06 – 2014-05-08 (×8): 1000 mg via ORAL
  Filled 2014-05-05 (×20): qty 2

## 2014-05-05 MED ORDER — ARTIFICIAL TEARS OP OINT
TOPICAL_OINTMENT | OPHTHALMIC | Status: AC
  Filled 2014-05-05: qty 3.5

## 2014-05-05 MED ORDER — SODIUM CHLORIDE 0.9 % IJ SOLN
INTRAMUSCULAR | Status: AC
  Filled 2014-05-05: qty 10

## 2014-05-05 MED ORDER — PROMETHAZINE HCL 25 MG/ML IJ SOLN: 6.2500 mg | INTRAMUSCULAR | Status: DC | PRN

## 2014-05-05 MED ORDER — LACTATED RINGERS IV SOLN
INTRAVENOUS | Status: DC
  Administered 2014-05-05: 10:00:00 via INTRAVENOUS

## 2014-05-05 MED ORDER — CETYLPYRIDINIUM CHLORIDE 0.05 % MT LIQD
7.0000 mL | Freq: Two times a day (BID) | OROMUCOSAL | Status: DC
  Administered 2014-05-05 – 2014-05-08 (×6): 7 mL via OROMUCOSAL

## 2014-05-05 MED ORDER — PHENYLEPHRINE HCL 10 MG/ML IJ SOLN
INTRAMUSCULAR | Status: DC | PRN
  Administered 2014-05-05 (×3): 80 ug via INTRAVENOUS

## 2014-05-05 MED ORDER — PROPOFOL 10 MG/ML IV BOLUS
INTRAVENOUS | Status: DC | PRN
  Administered 2014-05-05: 150 mg via INTRAVENOUS

## 2014-05-05 MED ORDER — ONDANSETRON HCL 4 MG/2ML IJ SOLN
INTRAMUSCULAR | Status: DC | PRN
  Administered 2014-05-05: 4 mg via INTRAVENOUS

## 2014-05-05 MED ORDER — INSULIN ASPART 100 UNIT/ML ~~LOC~~ SOLN: 0.0000 [IU] | SUBCUTANEOUS | Status: DC

## 2014-05-05 MED ORDER — EPHEDRINE SULFATE 50 MG/ML IJ SOLN
INTRAMUSCULAR | Status: AC
  Filled 2014-05-05: qty 1

## 2014-05-05 MED ORDER — PROPOFOL 10 MG/ML IV BOLUS
INTRAVENOUS | Status: AC
  Filled 2014-05-05: qty 20

## 2014-05-05 MED ORDER — HYDROMORPHONE HCL 1 MG/ML IJ SOLN
0.2500 mg | INTRAMUSCULAR | Status: DC | PRN
  Administered 2014-05-05: 0.5 mg via INTRAVENOUS
  Administered 2014-05-05 (×2): 0.25 mg via INTRAVENOUS

## 2014-05-05 MED ORDER — FENTANYL CITRATE 0.05 MG/ML IJ SOLN
INTRAMUSCULAR | Status: DC | PRN
  Administered 2014-05-05: 50 ug via INTRAVENOUS
  Administered 2014-05-05 (×2): 100 ug via INTRAVENOUS

## 2014-05-05 MED ORDER — ROCURONIUM BROMIDE 100 MG/10ML IV SOLN
INTRAVENOUS | Status: DC | PRN
  Administered 2014-05-05: 50 mg via INTRAVENOUS

## 2014-05-05 MED ORDER — DEXTROSE 5 % IV SOLN
INTRAVENOUS | Status: AC
  Filled 2014-05-05: qty 1.5

## 2014-05-05 MED ORDER — SUCCINYLCHOLINE CHLORIDE 20 MG/ML IJ SOLN
INTRAMUSCULAR | Status: AC
  Filled 2014-05-05: qty 1

## 2014-05-05 MED ORDER — ACETAMINOPHEN 160 MG/5ML PO SOLN
1000.0000 mg | Freq: Four times a day (QID) | ORAL | Status: DC
  Filled 2014-05-05: qty 40

## 2014-05-05 MED ORDER — GLYCOPYRROLATE 0.2 MG/ML IJ SOLN
INTRAMUSCULAR | Status: DC | PRN
  Administered 2014-05-05: .6 mg via INTRAVENOUS

## 2014-05-05 MED ORDER — DEXTROSE 5 % IV SOLN
1.5000 g | Freq: Two times a day (BID) | INTRAVENOUS | Status: DC
  Administered 2014-05-05 – 2014-05-09 (×8): 1.5 g via INTRAVENOUS
  Filled 2014-05-05 (×11): qty 1.5

## 2014-05-05 MED ORDER — LIDOCAINE HCL (CARDIAC) 20 MG/ML IV SOLN
INTRAVENOUS | Status: DC | PRN
  Administered 2014-05-05: 50 mg via INTRAVENOUS

## 2014-05-05 MED ORDER — SODIUM CHLORIDE 0.9 % IV BOLUS (SEPSIS)
500.0000 mL | Freq: Once | INTRAVENOUS | Status: AC
  Administered 2014-05-05: 500 mL via INTRAVENOUS

## 2014-05-05 MED ORDER — FENTANYL CITRATE 0.05 MG/ML IJ SOLN
INTRAMUSCULAR | Status: AC
  Filled 2014-05-05: qty 5

## 2014-05-05 SURGICAL SUPPLY — 67 items
ATTRACTOMAT 16X20 MAGNETIC DRP (DRAPES) IMPLANT
BRUSH CYTOL CELLEBRITY 1.5X140 (MISCELLANEOUS) ×3 IMPLANT
CANISTER SUCTION 2500CC (MISCELLANEOUS) ×6 IMPLANT
CATH THORACIC 28FR (CATHETERS) IMPLANT
CATH THORACIC 28FR RT ANG (CATHETERS) IMPLANT
CATH THORACIC 36FR (CATHETERS) IMPLANT
CATH THORACIC 36FR RT ANG (CATHETERS) IMPLANT
CONN ST 1/4X3/8  BEN (MISCELLANEOUS) ×2
CONN ST 1/4X3/8 BEN (MISCELLANEOUS) ×1 IMPLANT
CONT SPEC 4OZ CLIKSEAL STRL BL (MISCELLANEOUS) ×9 IMPLANT
COTTONBALL LRG STERILE PKG (GAUZE/BANDAGES/DRESSINGS) IMPLANT
COVER SURGICAL LIGHT HANDLE (MISCELLANEOUS) IMPLANT
COVER TABLE BACK 60X90 (DRAPES) ×3 IMPLANT
DRAIN CHANNEL 28F RND 3/8 FF (WOUND CARE) ×3 IMPLANT
DRAPE LAPAROSCOPIC ABDOMINAL (DRAPES) ×3 IMPLANT
ELECT REM PT RETURN 9FT ADLT (ELECTROSURGICAL) ×3
ELECTRODE REM PT RTRN 9FT ADLT (ELECTROSURGICAL) ×1 IMPLANT
FORCEPS BIOP RJ4 1.8 (CUTTING FORCEPS) ×3 IMPLANT
GAUZE SPONGE 4X4 12PLY STRL (GAUZE/BANDAGES/DRESSINGS) ×3 IMPLANT
GLOVE BIO SURGEON STRL SZ 6.5 (GLOVE) ×6 IMPLANT
GLOVE BIO SURGEONS STRL SZ 6.5 (GLOVE) ×3
GLOVE EUDERMIC 7 POWDERFREE (GLOVE) ×6 IMPLANT
GOWN STRL REUS W/ TWL XL LVL3 (GOWN DISPOSABLE) ×1 IMPLANT
GOWN STRL REUS W/TWL XL LVL3 (GOWN DISPOSABLE) ×2
HEMOSTAT POWDER SURGIFOAM 1G (HEMOSTASIS) IMPLANT
KIT BASIN OR (CUSTOM PROCEDURE TRAY) ×3 IMPLANT
KIT CLEAN ENDO COMPLIANCE (KITS) ×6 IMPLANT
KIT ROOM TURNOVER OR (KITS) ×3 IMPLANT
MARKER SKIN DUAL TIP RULER LAB (MISCELLANEOUS) ×3 IMPLANT
NEEDLE 22X1 1/2 (OR ONLY) (NEEDLE) IMPLANT
NEEDLE BIOPSY TRANSBRONCH 21G (NEEDLE) IMPLANT
NEEDLE SYS SONOTIP II EBUSTBNA (NEEDLE) ×3 IMPLANT
NS IRRIG 1000ML POUR BTL (IV SOLUTION) ×9 IMPLANT
OIL SILICONE PENTAX (PARTS (SERVICE/REPAIRS)) ×3 IMPLANT
PACK CHEST (CUSTOM PROCEDURE TRAY) ×3 IMPLANT
PAD ARMBOARD 7.5X6 YLW CONV (MISCELLANEOUS) ×6 IMPLANT
PAD ELECT DEFIB RADIOL ZOLL (MISCELLANEOUS) IMPLANT
SPONGE GAUZE 4X4 12PLY STER LF (GAUZE/BANDAGES/DRESSINGS) ×3 IMPLANT
SUT SILK  1 MH (SUTURE) ×2
SUT SILK 1 MH (SUTURE) ×1 IMPLANT
SUT SILK 2 0 SH (SUTURE) ×3 IMPLANT
SUT VIC AB 1 CTX 18 (SUTURE) ×3 IMPLANT
SUT VIC AB 2-0 CT1 18 (SUTURE) ×3 IMPLANT
SUT VIC AB 2-0 CT1 27 (SUTURE) ×2
SUT VIC AB 2-0 CT1 TAPERPNT 27 (SUTURE) ×1 IMPLANT
SUT VIC AB 3-0 SH 27 (SUTURE) ×2
SUT VIC AB 3-0 SH 27X BRD (SUTURE) ×1 IMPLANT
SUT VIC AB 3-0 X1 27 (SUTURE) ×3 IMPLANT
SWAB COLLECTION DEVICE MRSA (MISCELLANEOUS) IMPLANT
SYR 20CC LL (SYRINGE) ×3 IMPLANT
SYR 20ML ECCENTRIC (SYRINGE) ×6 IMPLANT
SYR 50ML SLIP (SYRINGE) IMPLANT
SYR 5ML LUER SLIP (SYRINGE) ×3 IMPLANT
SYR CONTROL 10ML LL (SYRINGE) IMPLANT
SYRINGE 10CC LL (SYRINGE) IMPLANT
SYSTEM SAHARA CHEST DRAIN ATS (WOUND CARE) ×3 IMPLANT
TAPE CLOTH SURG 4X10 WHT LF (GAUZE/BANDAGES/DRESSINGS) ×3 IMPLANT
TOWEL OR 17X24 6PK STRL BLUE (TOWEL DISPOSABLE) ×3 IMPLANT
TOWEL OR 17X26 10 PK STRL BLUE (TOWEL DISPOSABLE) ×6 IMPLANT
TRAP SPECIMEN MUCOUS 40CC (MISCELLANEOUS) ×9 IMPLANT
TRAY FOLEY CATH 14FRSI W/METER (CATHETERS) ×3 IMPLANT
TUBE ANAEROBIC SPECIMEN COL (MISCELLANEOUS) IMPLANT
TUBE CONNECTING 12'X1/4 (SUCTIONS)
TUBE CONNECTING 12X1/4 (SUCTIONS) IMPLANT
TUBE CONNECTING 20'X1/4 (TUBING) ×2
TUBE CONNECTING 20X1/4 (TUBING) ×4 IMPLANT
WATER STERILE IRR 1000ML POUR (IV SOLUTION) IMPLANT

## 2014-05-05 NOTE — Transfer of Care (Signed)
Immediate Anesthesia Transfer of Care Note  Patient: Patricia Carter  Procedure(s) Performed: Procedure(s): SUBXYPHOID PERICARDIAL WINDOW (N/A) FLEXIBLE BRONCHOSCOPY (N/A) VIDEO BRONCHOSCOPY WITH ENDOBRONCHIAL ULTRASOUND (N/A)  Patient Location: PACU  Anesthesia Type:General  Level of Consciousness: sedated and patient cooperative  Airway & Oxygen Therapy: Patient Spontanous Breathing and Patient connected to nasal cannula oxygen  Post-op Assessment: Report given to PACU RN and Post -op Vital signs reviewed and stable  Post vital signs: Reviewed and stable  Complications: No apparent anesthesia complications

## 2014-05-05 NOTE — Progress Notes (Signed)
S/p pericardial window  Resting comfortably  BP 115/65 mmHg  Pulse 98  Temp(Src) 98.1 F (36.7 C) (Oral)  Resp 23  Ht 5\' 3"  (1.6 m)  Wt 106 lb (48.081 kg)  BMI 18.78 kg/m2  SpO2 96%   Intake/Output Summary (Last 24 hours) at 05/05/14 1918 Last data filed at 05/05/14 1500  Gross per 24 hour  Intake   1150 ml  Output    200 ml  Net    950 ml    Uo ~ 30 ml/hr. Minimal drainage from CT

## 2014-05-05 NOTE — Op Note (Signed)
CARDIOVASCULAR SURGERY OPERATIVE NOTE  05/05/2014  Surgeon:  Gaye Pollack, MD  First Assistant: Suzzanne Cloud, PA-C   Preoperative Diagnosis:  Large pericardial effusion, right hilar mass and mediastinal adenopathy  Postoperative Diagnosis:  Same  Procedure:   1. Subxyphoid pericardial window 2. Flexible video bronchoscopy with biopsy and brushing of right middle lobe and biopsy of right lower lobe. 3. Endobronchial ultrasound biopsy of subcarinal lymph node mass.   Anesthesia:  General Endotracheal   Clinical History/Surgical Indication:   The patient is a 59 year old non-smoker with a history of left breast cancer, s/p left mastectomy in 2010 with bilateral reconstruction. She reports developing a cough in May 2015 that would not go away and she was treated at [facility] urgent care and then at [facility] in [place] for presumed bronchitis. She received antibiotics and prednisone and was not improved and continued to worsen. Her CXR in August 2015 reportedly showed a RUL pulmonary nodule and a CT was recommended but not done and she says she did not follow up there. She has had progressive shortness of breath and cough and was self-referred to pulmonary clinic on 04/29/2014. A CT of the chest shows a large right hilar mass with obstruction of the RML bronchi and narrowing of the bronchus intermedius. There is extensive mediastinal adenopathy particularly in the subcarinal area. There is a large pericardial effusion. She was admitted by Dr. Melvyn Novas to expedite workup.  She has a large right hilar mass extending into the mediastinum into the subcarinal region that is narrowing the right pulmonary artery and compressing the SVC somewhat. There is obstruction of the RML bronchus and a large density in the RML that could be tumor or collapsed lung. There is a satellite RLL nodule and scattered small nodular and ground-glass opacities in the RUL. There is a large pericardial effusion. I think  this is a malignant process and could be recurrent breast cancer or lung cancer. I think it would be best to proceed with a subxyphoid pericardial window for diagnostic and therapeutic purposes as well as bronchoscopy and EBUS biopsy of the subcarinal and/or hilar lymph nodes to make a diagnosis. I reviewed the CT scan with her and her friend in the room and discussed my suspicion that this is a malignant and metastatic process. I discussed the surgical procedure with her including alternatives, benefits and risks including but not limited to bleeding, infection, pneumothorax, ventral hernia, and recurrence of the pericardial effusion. She understands and agrees to proceed.   Preparation:  The patient was seen in the preoperative holding area and the correct patient, correct operation were confirmed with the patient after reviewing the medical record and catheterization. The consent was signed by me. Preoperative antibiotics were given.  The patient was taken back to the operating room and positioned supine on the operating room table. After being placed under general endotracheal anesthesia by the anesthesia team a foley catheter was placed. The neck, chest, and abdomen were prepped with betadine soap and solution and draped in the usual sterile manner. A surgical time-out was taken and the correct patient and operative procedure were confirmed with the nursing and anesthesia staff.    Subxyphoid pericardial window:  A 4 cm incision was made over the xyphoid process and carried down through the subcutaneous tissue using cautery until the midline fascia was encountered. The fascia was divided in the midline and the subxyphoid space entered. The anterior pericardium was visualized just at the diaphragm and it was opened  with cautery. The pericardial space was entered and about 400 cc of serous fluid was removed. A 28 F Blake drain was placed in the inferior pericardial space through a separate small  incision. Hemostasis was complete. The midline fascia was approximated with interrupted #0 vicryl sutures. The subcutaneous tissue was approximated with continuous 2-0 vicryl suture and the skin with 3-0 vicryl subcuticular suture.    Flexible Video-Bronchoscopy:  The distal trachea was normal. The carina was sharp. The left bronchial tree had normal segmental anatomy with no visible abnormality, no endobronchial lesions and no extrinsic compression. The right mainstem bronchus was normal. The right upper lobe bronchus was normal but the segmental bronchi were all narrowed with no endobronchial lesions. The bronchus intermedius was diffusely narrowed with mucosal irregularity and edema and signs of extrinsic compression. The orifice of the RML bronchus was open but immediately after that the segmental bronchi were obstructed with endobronchial mass. The superior segmental bronchus was narrowed and deformed as was the main lower lobe bronchus. The basilar segmental bronchi were patent. I took multiple biopsies of the RML bronchus but the biopsy forceps would not passed very far into the segmental bronchi due to complete obstruction. I also took brushings of the RML. I biopsied the RLL bronchus in an area that was very irregular. These specimens were sent in saline in case this was a lymphoid malignancy.   Endobronchial Ultrasound:  The EBUS scope was advanced into the left main bronchus and the subcarinal lymph node mass identified. Multiple transbronchial needle biopsies were taken and 3 specimens were sent immediately to pathology. Dr. Lyndon Code called the OR and said there was abundant lymphoid tissue and it could be a lymphoid malignancy and wanted additional tissue for further study. Hopefully the transbronchial biopsies will provide enough tissue. There was complete hemostasis and the scope was removed.    The sponge, needle, and instrument counts were correct according to the nurses. Dermabond was  applied to the incision. The patient was awakened, extubated, and transported to the PACU in stable condition.

## 2014-05-05 NOTE — Progress Notes (Signed)
Utilization Review Completed.Donne Anon T1/08/2014

## 2014-05-05 NOTE — Anesthesia Procedure Notes (Signed)
Procedure Name: Intubation Date/Time: 05/05/2014 10:47 AM Performed by: Eligha Bridegroom Pre-anesthesia Checklist: Patient identified, Timeout performed, Emergency Drugs available and Suction available Patient Re-evaluated:Patient Re-evaluated prior to inductionOxygen Delivery Method: Circle system utilized Preoxygenation: Pre-oxygenation with 100% oxygen Intubation Type: IV induction Ventilation: Mask ventilation without difficulty and Oral airway inserted - appropriate to patient size Laryngoscope Size: Mac and 3 Grade View: Grade I Tube type: Oral Tube size: 8.0 mm Number of attempts: 1 Airway Equipment and Method: Stylet and LTA kit utilized Placement Confirmation: ETT inserted through vocal cords under direct vision,  breath sounds checked- equal and bilateral and positive ETCO2 Secured at: 21 cm Tube secured with: Tape Dental Injury: Teeth and Oropharynx as per pre-operative assessment

## 2014-05-05 NOTE — Brief Op Note (Signed)
05/04/2014 - 05/05/2014  11:36 AM  PATIENT:  Patricia Carter  59 y.o. female  PRE-OPERATIVE DIAGNOSIS:  Pericardial Effusion  POST-OPERATIVE DIAGNOSIS:  Pericardial Effusion  PROCEDURE:  Procedure(s): SUBXYPHOID PERICARDIAL WINDOW (N/A) FLEXIBLE BRONCHOSCOPY (N/A) VIDEO BRONCHOSCOPY WITH ENDOBRONCHIAL ULTRASOUND (N/A)  SURGEON:  Surgeon(s) and Role:    * Gaye Pollack, MD - Primary  PHYSICIAN ASSISTANT: Suzzanne Cloud PA-C  400 cc of serous pericardial fluid  ANESTHESIA:   general  EBL:   none  BLOOD ADMINISTERED:none  DRAINS: (22F) Blake drain(s) in the pericardium   LOCAL MEDICATIONS USED:  LIDOCAINE   SPECIMEN:  Source of Specimen:  pericardial fluid for cytology, pericardium for pathology  DISPOSITION OF SPECIMEN:  PATHOLOGY  COUNTS:  YES   DICTATION: .Note written in EPIC  PLAN OF CARE: Admit to inpatient   PATIENT DISPOSITION:  PACU - hemodynamically stable.   Delay start of Pharmacological VTE agent (>24hrs) due to surgical blood loss or risk of bleeding: not applicable

## 2014-05-05 NOTE — Anesthesia Postprocedure Evaluation (Signed)
  Anesthesia Post-op Note  Patient: Patricia Carter  Procedure(s) Performed: Procedure(s): SUBXYPHOID PERICARDIAL WINDOW (N/A) FLEXIBLE BRONCHOSCOPY (N/A) VIDEO BRONCHOSCOPY WITH ENDOBRONCHIAL ULTRASOUND (N/A)  Patient Location: PACU  Anesthesia Type:General  Level of Consciousness: awake and alert   Airway and Oxygen Therapy: Patient Spontanous Breathing  Post-op Pain: mild  Post-op Assessment: Post-op Vital signs reviewed  Post-op Vital Signs: stable  Last Vitals:  Filed Vitals:   05/05/14 1400  BP:   Pulse: 94  Temp:   Resp: 20    Complications: No apparent anesthesia complications

## 2014-05-05 NOTE — Anesthesia Preprocedure Evaluation (Addendum)
Anesthesia Evaluation  Patient identified by MRN, date of birth, ID band Patient awake    Reviewed: Allergy & Precautions, NPO status , Unable to perform ROS - Chart review only  Airway Mallampati: II   Neck ROM: Full    Dental  (+) Teeth Intact   Pulmonary shortness of breath, Recent URI ,  Hilar mass, some pulm collapse + rhonchi         Cardiovascular negative cardio ROS  Rhythm:Regular Rate:Normal  Pericardial effusion   Neuro/Psych    GI/Hepatic negative GI ROS, Neg liver ROS,   Endo/Other    Renal/GU negative Renal ROS     Musculoskeletal   Abdominal   Peds  Hematology   Anesthesia Other Findings   Reproductive/Obstetrics                            Anesthesia Physical Anesthesia Plan  ASA: III  Anesthesia Plan: General   Post-op Pain Management:    Induction: Intravenous  Airway Management Planned: Oral ETT  Additional Equipment: CVP  Intra-op Plan:   Post-operative Plan: Extubation in OR  Informed Consent: I have reviewed the patients History and Physical, chart, labs and discussed the procedure including the risks, benefits and alternatives for the proposed anesthesia with the patient or authorized representative who has indicated his/her understanding and acceptance.   Dental advisory given  Plan Discussed with: CRNA and Surgeon  Anesthesia Plan Comments:         Anesthesia Quick Evaluation

## 2014-05-05 NOTE — Progress Notes (Signed)
   Name: Patricia Carter MRN: [id number] DOB: August 20, 1955    ADMISSION DATE:  05/04/2014   CHIEF COMPLAINT:  Dyspnea / Cough  BRIEF PATIENT DESCRIPTION: 62 yowf, never smoker, with h/o L breast surgery for CA 2010 declined adjuvant therapy cc indolent onset progressive DOE x 6 m associated with dry cough. CT chest 12/31/15c/w threatened SVC syndrome and moderate pericardial effusion so admitted for expedient w/u/ T surgery eval.  To OR on 1/5 for pericardial window and mass biopsy.   SIGNIFICANT EVENTS  01/04  Admit with threatened SVC syndrome, moderate pericardial effusion  01/05  To OR for pericardial window & mass biopsy    STUDIES:  12/31  CTA Chest >> neg for PE, R hilar mass with compression of the SVC and right upper lobe pulmonary artery and obstruction of the right middle lobe bronchus.  Right middle lobe mass and postobstructive collapse.  Possible satellite right lower lobe nodule. Moderate pericardial effusion.  Scattered small nodular and ground-glass opacities in the right upper lobe, likely infectious or inflammatory in etiology 01/04  ECHO >>    SUBJECTIVE: Pt tearful, worried about surgery and results of findings.    VITAL SIGNS: Temp:  [97.5 F (36.4 C)-97.8 F (36.6 C)] 97.5 F (36.4 C) (01/05 0454) Pulse Rate:  [78-81] 81 (01/05 0454) Resp:  [18] 18 (01/05 0454) BP: (122-142)/(78-91) 122/78 mmHg (01/05 0454) SpO2:  [98 %-100 %] 100 % (01/05 0454) Weight:  [106 lb (48.081 kg)] 106 lb (48.081 kg) (01/04 1549)  PHYSICAL EXAMINATION: General:  Thin adult female in NAD Neuro:  AAOx4, speech clear, MAE  HEENT:  Mm pink/moist, no JVD Cardiovascular:  s1s2 rrr, no m/r/g  Lungs:  resp's even/non-labored, lungs bilaterally clear  Abdomen:  Flat, soft, bsx4 active  Musculoskeletal:  No acute deformities  Skin:  Warm/dry, no edema    Recent Labs Lab 04/30/14 1615 05/04/14 1725  NA  --  140  K  --  4.0  CL  --  105  CO2  --  28  BUN  --  14  CREATININE 1.10  0.95  GLUCOSE  --  124*    Recent Labs Lab 05/04/14 1725  HGB 14.3  HCT 41.8  WBC 5.6  PLT 208   No results found.  ASSESSMENT / PLAN:  Right Lung Mass - with obstruction of RUL, threatened SVC syndrome Moderate Pericardial Effusion Hx of Breast Cancer   Plan: NPO Pending OR for pericardial window with mass biopsy Await cytology findings Appreciate TCTS assistance  Pre-op ABX per TCTS Pulmonary hygiene  D/C ECHO as not done prior to surgery  Anxiety   Plan: Chaplain called for pre-op needs  Xanax PRN     Will follow up post surgery.     Noe Gens, NP-C [facility] Pgr: [phone] or [phone]    05/05/2014, 10:35 AM

## 2014-05-05 NOTE — Progress Notes (Signed)
Chaplain responded to page from RN.  Pt appears to be anxious about what she believes to be cancer.  Pt preparing to go to surgery.  Pt shared she battled cancer previously.  Pt cries when discussing the possibility of facing cancer again.  Pt also anxious about getting to see daughter before surgery.  Daughter on way to hospital.  Chaplain advocated for pt, informing RN and OR transport that daughter was on the way.  Chaplain provided emotional and spiritual support as well as the ministry of presence, prayer and empathetic listening.  Chaplain will follow up as needed.    05/05/14 1000  Clinical Encounter Type  Visited With Patient;Health care provider  Visit Type Initial;Psychological support;Social support  Referral From Nurse  Spiritual Encounters  Spiritual Needs Prayer;Emotional;Grief support  Stress Factors  Patient Stress Factors Exhausted;Family relationships;Health changes;Loss of control;Major life changes   Geralyn Flash  05/05/2014 10:40 AM

## 2014-05-06 ENCOUNTER — Inpatient Hospital Stay (HOSPITAL_COMMUNITY): Payer: Medicaid Other

## 2014-05-06 LAB — GLUCOSE, CAPILLARY
GLUCOSE-CAPILLARY: 77 mg/dL (ref 70–99)
GLUCOSE-CAPILLARY: 77 mg/dL (ref 70–99)
GLUCOSE-CAPILLARY: 84 mg/dL (ref 70–99)
GLUCOSE-CAPILLARY: 108 mg/dL — AB (ref 70–99)

## 2014-05-06 NOTE — Progress Notes (Signed)
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  She has hx of breast cancer.  SIGNIFICANT EVENTS  01/04  Admit with threatened SVC syndrome, moderate pericardial effusion  01/05  To OR for pericardial window & mass biopsy   STUDIES:  12/31  CTA Chest >> Rt hilar mass with compression of SVC/RUL PA and obstruction of RML bronchus with ATX, mod pericardial effusion  SUBJECTIVE:  She is very nervous about path results.  VITAL SIGNS: Temp:  [97.4 F (36.3 C)-98.4 F (36.9 C)] 98.4 F (36.9 C) (01/06 0400) Pulse Rate:  [82-106] 95 (01/06 0800) Resp:  [10-44] 44 (01/06 0800) BP: (101-139)/(51-75) 122/66 mmHg (01/06 0800) SpO2:  [94 %-100 %] 96 % (01/06 0800)  PHYSICAL EXAMINATION: General: pleasant Neuro: normal strength HEENT: no sinus tenderness Cardiovascular: regular Lungs: chest tubes in place Abdomen: soft, non tender Musculoskeletal:  No acute deformities  Skin:  Warm/dry, no edema   CBC Recent Labs     05/04/14  1725  WBC  5.6  HGB  14.3  HCT  41.8  PLT  208    Coag's Recent Labs     05/04/14  1725  APTT  31  INR  0.97    BMET Recent Labs     05/04/14  1725  NA  140  K  4.0  CL  105  CO2  28  BUN  14  CREATININE  0.95  GLUCOSE  124*    Electrolytes Recent Labs     05/04/14  1725  CALCIUM  9.3   Liver Enzymes Recent Labs     05/04/14  1725  AST  23  ALT  10  ALKPHOS  103  BILITOT  0.3  ALBUMIN  4.0   Glucose Recent Labs     05/05/14  1952  05/05/14  2353  05/06/14  0346  05/06/14  0724  GLUCAP  114*  87  77  77    Imaging Dg Chest Port 1 View  05/06/2014   CLINICAL DATA:  Pericardial drain.  EXAM: PORTABLE CHEST - 1 VIEW  COMPARISON:  May 05, 2014.  FINDINGS: Pericardial drain is again noted. No  pneumothorax is noted. Stable cardiomediastinal silhouette. Increased left basilar opacity is noted concerning for pneumonia or atelectasis with mild associated pleural effusion. Minimal right basilar subsegmental atelectasis is noted as well. Bony thorax is intact.  IMPRESSION: Pericardial drain is unchanged in position. Increased left basilar opacity is noted concerning for pneumonia or atelectasis with associated pleural effusion. Minimal right basilar subsegmental atelectasis is noted.   Electronically Signed   By: Sabino Dick M.D.   On: 05/06/2014 08:03   Dg Chest Port 1 View  05/05/2014   CLINICAL DATA:  Pericardial effusion  EXAM: PORTABLE CHEST - 1 VIEW  COMPARISON:  04/29/2014  FINDINGS: Cardiac shadow is significantly reduced in size when compare with the prior exam. A pericardial drain is noted in place. No focal infiltrate or sizable effusion is seen. No acute bony abnormality is seen. The right hilar mass is less present on the current exam likely related to patient positioning and rotation to the right.  IMPRESSION: Significant reduction cardiac size consistent with pericardial drain. The catheter is noted over the midline in satisfactory position.  These results were called  by telephone at the time of interpretation on 05/05/2014 at 2:15 pm to [facility], the patients nurse, who verbally acknowledged these results.   Electronically Signed   By: Inez Catalina M.D.   On: 05/05/2014 14:15    ASSESSMENT / PLAN:  Pericardial effusion s/p subxyphoid pericardial window 1/05. Rt hilar mass s/p bronchoscopy bx 1/05. Plan: - post op care, pain control per TCTS - f/u path results  PCCM will sign off.  Please call if additional help is needed.  Chesley Mires, MD [facility] Pulmonary/Critical Care 05/06/2014, 9:18 AM Pager:  [phone] After 3pm call: [phone]

## 2014-05-06 NOTE — Progress Notes (Signed)
EVENING ROUNDS NOTE :     [name]       [place] [address]             [phone]                 1 Day Post-Op Procedure(s) (LRB): SUBXYPHOID PERICARDIAL WINDOW (N/A) FLEXIBLE BRONCHOSCOPY (N/A) VIDEO BRONCHOSCOPY WITH ENDOBRONCHIAL ULTRASOUND (N/A)  Total Length of Stay:  LOS: 2 days  BP 92/51 mmHg  Pulse 79  Temp(Src) 98.6 F (37 C) (Oral)  Resp 19  Ht 5\' 3"  (1.6 m)  Wt 106 lb (48.081 kg)  BMI 18.78 kg/m2  SpO2 96%  .Intake/Output      01/06 0701 - 01/07 0700   P.O. 790   I.V. (mL/kg) 940 (19.6)   IV Piggyback 50   Total Intake(mL/kg) 1780 (37)   Urine (mL/kg/hr) 1545 (2.5)   Chest Tube 110 (0.2)   Total Output 1655   Net +125         . lactated ringers 75 mL/hr (05/06/14 1530)     Lab Results  Component Value Date   WBC 5.6 05/04/2014   HGB 14.3 05/04/2014   HCT 41.8 05/04/2014   PLT 208 05/04/2014   GLUCOSE 124* 05/04/2014   ALT 10 05/04/2014   AST 23 05/04/2014   NA 140 05/04/2014   K 4.0 05/04/2014   CL 105 05/04/2014   CREATININE 0.95 05/04/2014   BUN 14 05/04/2014   CO2 28 05/04/2014   TSH 3.025 05/04/2014   INR 0.97 05/04/2014   Good pain control  Breathing is comfortable off O2 Very stable    Marlee Armenteros E, PA-C

## 2014-05-06 NOTE — Progress Notes (Addendum)
      [address]       [place] [address]             [phone]        1 Day Post-Op Procedure(s) (LRB): SUBXYPHOID PERICARDIAL WINDOW (N/A) FLEXIBLE BRONCHOSCOPY (N/A) VIDEO BRONCHOSCOPY WITH ENDOBRONCHIAL ULTRASOUND (N/A)  Subjective: Patient with incisional pain, worse with coughing.  Objective: Vital signs in last 24 hours: Temp:  [97.4 F (36.3 C)-98.4 F (36.9 C)] 98.4 F (36.9 C) (01/06 0400) Pulse Rate:  [82-106] 83 (01/06 0700) Cardiac Rhythm:  [-] Normal sinus rhythm (01/06 0700) Resp:  [10-34] 13 (01/06 0700) BP: (101-139)/(51-75) 108/61 mmHg (01/06 0700) SpO2:  [94 %-100 %] 98 % (01/06 0700)   Current Weight  05/04/14 106 lb (48.081 kg)      Intake/Output from previous day: 01/05 0701 - 01/06 0700 In: 2600 [I.V.:2050; IV Piggyback:550] Out: 615 [Urine:445; Chest Tube:170]   Physical Exam:  Cardiovascular: RRR Pulmonary: Mostly clear to auscultation bilaterally; no rales, wheezes, or rhonchi. Abdomen: Soft, non tender, bowel sounds present. Extremities: SCDs in place Wounds: Dressing is clean and dry.    Lab Results: CBC: Recent Labs  05/04/14 1725  WBC 5.6  HGB 14.3  HCT 41.8  PLT 208   BMET:  Recent Labs  05/04/14 1725  NA 140  K 4.0  CL 105  CO2 28  GLUCOSE 124*  BUN 14  CREATININE 0.95  CALCIUM 9.3    PT/INR:  Lab Results  Component Value Date   INR 0.97 05/04/2014   ABG:  INR: Will add last result for INR, ABG once components are confirmed Will add last 4 CBG results once components are confirmed  Assessment/Plan:  1. CV - SR in the 80's. 2.  Pulmonary - Pericardial tube with 170 cc since surgery. CXR appears to show no pneumothorax, some atelectasis on the right. Await final path results of pericardial fluid and right hilar mass.Encourage incentive spirometer and flutter valve. 3.  No labs ordered for today. Will order for am 4. UO marginal at times. Continue IVF   ZIMMERMAN,DONIELLE  MPA-C 05/06/2014,7:29 AM   Chart reviewed, patient examined, agree with above.

## 2014-05-07 ENCOUNTER — Inpatient Hospital Stay (HOSPITAL_COMMUNITY): Payer: Medicaid Other

## 2014-05-07 ENCOUNTER — Encounter (HOSPITAL_COMMUNITY): Payer: Self-pay | Admitting: Surgery

## 2014-05-07 LAB — BASIC METABOLIC PANEL
ANION GAP: 4 — AB (ref 5–15)
BUN: 11 mg/dL (ref 6–23)
CALCIUM: 8.8 mg/dL (ref 8.4–10.5)
CO2: 29 mmol/L (ref 19–32)
Chloride: 106 mEq/L (ref 96–112)
Creatinine, Ser: 0.79 mg/dL (ref 0.50–1.10)
GFR, EST NON AFRICAN AMERICAN: 89 mL/min — AB (ref >90)
GLUCOSE: 93 mg/dL (ref 70–99)
Potassium: 4.1 mmol/L (ref 3.5–5.1)
Sodium: 139 mmol/L (ref 135–145)

## 2014-05-07 LAB — GLUCOSE, CAPILLARY: Glucose-Capillary: 108 mg/dL — ABNORMAL HIGH (ref 70–99)

## 2014-05-07 NOTE — Progress Notes (Signed)
CT surgery p.m. Rounds  Patient comfortable sitting in a chair Minimal drainage from pericardial drain Maintaining sinus rhythm Pain control has been satisfactory today--she is comfortable at rest

## 2014-05-07 NOTE — Progress Notes (Addendum)
      [address]       ,[place] [address]             [phone]        2 Days Post-Op Procedure(s) (LRB): SUBXYPHOID PERICARDIAL WINDOW (N/A) FLEXIBLE BRONCHOSCOPY (N/A) VIDEO BRONCHOSCOPY WITH ENDOBRONCHIAL ULTRASOUND (N/A)  Subjective: Patient sitting in chair. Anxious and emotional about pericardial tube being removed.  Objective: Vital signs in last 24 hours: Temp:  [98.2 F (36.8 C)-98.6 F (37 C)] 98.2 F (36.8 C) (01/06 2355) Pulse Rate:  [70-95] 84 (01/07 0600) Cardiac Rhythm:  [-] Normal sinus rhythm (01/07 0600) Resp:  [13-44] 21 (01/07 0600) BP: (90-122)/(51-74) 102/74 mmHg (01/07 0600) SpO2:  [95 %-99 %] 98 % (01/07 0600) Weight:  [106 lb 11.2 oz (48.4 kg)] 106 lb 11.2 oz (48.4 kg) (01/07 0600)   Current Weight  05/07/14 106 lb 11.2 oz (48.4 kg)      Intake/Output from previous day: 01/06 0701 - 01/07 0700 In: 2730 [P.O.:790; I.V.:1840; IV Piggyback:100] Out: 2685 [Urine:2535; Chest Tube:150]   Physical Exam:  Cardiovascular: RRR Pulmonary: Mostly clear to auscultation bilaterally; no rales, wheezes, or rhonchi. Abdomen: Soft, non tender, bowel sounds present. Extremities: No lower extremity edema Wounds: Dressing is clean and dry.    Lab Results: CBC:  Recent Labs  05/04/14 1725  WBC 5.6  HGB 14.3  HCT 41.8  PLT 208   BMET:   Recent Labs  05/04/14 1725 05/07/14 0348  NA 140 139  K 4.0 4.1  CL 105 106  CO2 28 29  GLUCOSE 124* 93  BUN 14 11  CREATININE 0.95 0.79  CALCIUM 9.3 8.8    PT/INR:  Lab Results  Component Value Date   INR 0.97 05/04/2014   ABG:  INR: Will add last result for INR, ABG once components are confirmed Will add last 4 CBG results once components are confirmed  Assessment/Plan:  1. CV - SR in the 80's. 2.  Pulmonary - Pericardial tube with 50 cc last 24 hours (from my mark yesterday). Remove pericardial tube.Await final path results of pericardial fluid and right hilar  mass.Encourage incentive spirometer and flutter valve. 3. Transfer to [place] MPA-C 05/07/2014,7:31 AM     Chart reviewed, patient examined, agree with above. Pathology is still pending. There was some concern about possible lymphoma so the work up could take longer. She could go home tomorrow if walking well today and then follow up in [facility] clinic.

## 2014-05-08 NOTE — Discharge Summary (Addendum)
[address]       [place] [address]             [phone]              Discharge Summary  Name: Patricia Carter DOB: 1955-05-04 59 y.o. 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 Extensive mediastinal adenopathy Large pericardial effusion   Past Medical History  Diagnosis Date  . Breast cancer      Procedures: SUBXYPHOID PERICARDIAL WINDOW - 05/05/2014 FLEXIBLE BRONCHOSCOPY  Biopsy of right lower lobe  Biopsy and brushing of right middle lobe ENDOBRONCHIAL ULTRASOUND     HPI:  The patient is a 59 y.o. female with a history of left breast cancer, s/p left mastectomy in 2010 with bilateral reconstruction. She reports developing a cough in May 2015 that would not go away and she was treated at [facility] urgent care and then at [facility] in [place] for presumed bronchitis. She received antibiotics and prednisone and was not improved and continued to worsen. Her CXR in August 2015 reportedly showed a RUL pulmonary nodule and a CT was recommended but not done and she says she did not follow up there. She has had progressive shortness of breath and cough and was self-referred to pulmonary clinic on 04/29/2014. A CT of the chest shows a large right hilar mass with obstruction of the RML bronchi and narrowing of the bronchus intermedius. There is extensive mediastinal adenopathy particularly in the subcarinal area. There is a large pericardial effusion. She was admitted by Dr. Melvyn Novas to expedite workup.    Hospital Course:  The patient was admitted to [facility] on 05/04/2014. Dr. Cyndia Bent was consulted for thoracic surgical  evaluation.  After review of the CT, he felt that this likely represented a malignant and metastatic process, and recommended proceeding with subxiphoid pericardial window for diagnostic and therapeutic purposes, as well as bronchoscopy and EBUS biopsy of the subcarinal and/or hilar lymph nodes to make a diagnosis.  All risks, benefits and alternatives of surgery were explained in detail, and the patient agreed to proceed. The patient was taken to the operating room and underwent the above procedure.    The postoperative course has generally been uneventful.  Her pericardial drain has been removed and follow up chest x-rays have remained stable.  Incisions are healing well.  The patient is ambulating in the halls without difficulty and tolerating a regular diet. Surgical pathology remains pending, however, intraoperative frozen sections revealed abundant lymphoid tissue which could represent a lymphoid malignancy. This was sent out for further studies. Overall, the patient is progressing well and is medically stable for discharge home on 05/09/2014.    Recent vital signs:  Filed Vitals:   05/09/14 0549  BP: 121/70  Pulse: 83  Temp: 97.9 F (36.6 C)  Resp: 20    Recent laboratory studies:  CBC:No results for input(s): WBC, HGB, HCT, PLT in the last 72 hours. BMET:   Recent Labs  05/07/14 0348  NA 139  K 4.1  CL 106  CO2 29  GLUCOSE 93  BUN 11  CREATININE 0.79  CALCIUM 8.8    PT/INR: No results for input(s): LABPROT, INR in the last  72 hours.   Discharge Medications:     Medication List    TAKE these medications        acetaminophen 325 MG tablet  Commonly known as:  TYLENOL  Take 650 mg by mouth every 6 (six) hours as needed for headache.     ALEVE PO  Take 1 tablet by mouth 2 (two) times daily as needed (headache).     oxyCODONE 5 MG immediate release tablet  Commonly known as:  Oxy IR/ROXICODONE  Take 1-2 tablets (5-10 mg total) by mouth every 4 (four) hours as needed  for severe pain.         Discharge Instructions:  The patient is to refrain from driving, heavy lifting or strenuous activity.  May shower daily and clean incisions with soap and water.  May resume regular diet.   Follow Up: Follow-up Information    Follow up with Gaye Pollack, MD On 06/03/2014.   Specialty:  Cardiothoracic Surgery   Why:  Appointment is at 11:30   Contact information:   [address] [place] [address] [phone]         Follow-up Information    Follow up with Gaye Pollack, MD On 06/03/2014.   Specialty:  Cardiothoracic Surgery   Why:  Appointment is at 11:30   Contact information:   [address] [place] [address] [place], [place] 05/09/2014, 8:08 AM

## 2014-05-08 NOTE — Discharge Instructions (Signed)
You may shower daily and clean incisions with soap and water. Walk daily and continue breathing exercises. Continue your same pre-operative diet. Call the office [phone]) if you develop fever > 101, redness, swelling or drainage from the incision site, or increased shortness of breath or chest pain.

## 2014-05-08 NOTE — Progress Notes (Signed)
Transferred to 2W17 via wheelchair. Portable monitor on. No changes.

## 2014-05-08 NOTE — Progress Notes (Signed)
CARE MANAGEMENT NOTE 05/08/2014  Patient:  Patricia Carter, Patricia Carter   Account Number:  [id number]  Date Initiated:  05/07/2014  Documentation initiated by:  Jaimie Redditt  Subjective/Objective Assessment:   dx pericardial effusion, right hilar mass and mediastinal adenopathy; lives alone     In-house referral  Development worker, community      DC Planning Services  CM consult      Status of service:  In process, will continue to follow  Per UR Regulation:  Reviewed for med. necessity/level of care/duration of stay  Comments:  05/08/14 [place] Provided list of free and low-cost clinics in both [place] and [place] counties.  05/07/14 [place] MSN BSN CCM Pt lives alone, has no insurance but completed Medicaid application one week ago.  TC to financial counselor to inform.  Pt is interested in free or reduced cost clinic, lives in [place] but may move near dtr and brother who live in [place]. CARE MANAGEMENT NOTE 05/08/2014  Patient:  Patricia Carter, Patricia Carter   Account Number:  [id number]  Date Initiated:  05/07/2014  Documentation initiated by:  Malicia Blasdel  Subjective/Objective Assessment:   dx pericardial effusion, right hilar mass and mediastinal adenopathy; lives alone     In-house referral  Development worker, community      DC Planning Services  CM consult      Status of service:  In process, will continue to follow  Per UR Regulation:  Reviewed for med. necessity/level of care/duration of stay  Comments:  05/08/14 [place] Provided list of free and low-cost clinics in both [place] and [place] counties.  05/07/14 [place] MSN BSN CCM Pt lives alone, has no insurance but completed Medicaid application one week ago.  TC to financial counselor to inform.  Pt is interested in free or reduced cost clinic, lives in [place] but may move near dtr and brother who live in [place]. CARE MANAGEMENT  NOTE 05/08/2014  Patient:  Patricia Carter, Patricia Carter   Account Number:  [id number]  Date Initiated:  05/07/2014  Documentation initiated by:  Christropher Gintz  Subjective/Objective Assessment:   dx pericardial effusion, right hilar mass and mediastinal adenopathy; lives alone     In-house referral  Development worker, community      DC Planning Services  CM consult      Status of service:  In process, will continue to follow  Per UR Regulation:  Reviewed for med. necessity/level of care/duration of stay  Comments:  05/08/14 [place] Provided list of free and low-cost clinics in both [place] and [place] counties.  05/07/14 [place] MSN BSN CCM Pt lives alone, has no insurance but completed Medicaid application one week ago.  TC to financial counselor to inform.  Pt is interested in free or reduced cost clinic, lives in [place] but may move near dtr and brother who live in [place].

## 2014-05-09 ENCOUNTER — Inpatient Hospital Stay (HOSPITAL_COMMUNITY): Payer: Medicaid Other

## 2014-05-09 MED ORDER — OXYCODONE HCL 5 MG PO TABS: 5.0000 mg | ORAL_TABLET | ORAL | Status: DC | PRN

## 2014-05-09 NOTE — Progress Notes (Addendum)
      [address]       [place] [address]             [phone]      4 Days Post-Op Procedure(s) (LRB): SUBXYPHOID PERICARDIAL WINDOW (N/A) FLEXIBLE BRONCHOSCOPY (N/A) VIDEO BRONCHOSCOPY WITH ENDOBRONCHIAL ULTRASOUND (N/A)   Subjective:  Ms. Huckeba continues to have issues with cough this morning.  She says she can feel the pressure in her incision when this happens and is concerned about it healing.  I instructed the patient to make sure she presses her pillow against the area to help brace against that.  She is also tearful regarding her possible pathology results.  She states she is going home today.  Objective: Vital signs in last 24 hours: Temp:  [97.9 F (36.6 C)-98.2 F (36.8 C)] 97.9 F (36.6 C) (01/09 0549) Pulse Rate:  [72-90] 83 (01/09 0549) Cardiac Rhythm:  [-] Normal sinus rhythm (01/08 2015) Resp:  [15-25] 20 (01/09 0549) BP: (94-131)/(59-73) 121/70 mmHg (01/09 0549) SpO2:  [95 %-99 %] 98 % (01/09 0549)   Intake/Output from previous day: 01/08 0701 - 01/09 0700 In: 780 [P.O.:720; I.V.:10; IV Piggyback:50] Out: -   General appearance: alert, cooperative and upset Heart: regular rate and rhythm Lungs: clear to auscultation bilaterally Abdomen: soft, non-tender; bowel sounds normal; no masses,  no organomegaly Wound: clean and dry  Lab Results: No results for input(s): WBC, HGB, HCT, PLT in the last 72 hours. BMET:  Recent Labs  05/07/14 0348  NA 139  K 4.1  CL 106  CO2 29  GLUCOSE 93  BUN 11  CREATININE 0.79  CALCIUM 8.8    PT/INR: No results for input(s): LABPROT, INR in the last 72 hours. ABG No results found for: PHART, HCO3, TCO2, ACIDBASEDEF, O2SAT CBG (last 3)   Recent Labs  05/06/14 1144 05/06/14 1548  GLUCAP 84 108*    Assessment/Plan: S/P Procedure(s) (LRB): SUBXYPHOID PERICARDIAL WINDOW (N/A) FLEXIBLE BRONCHOSCOPY (N/A) VIDEO BRONCHOSCOPY WITH ENDOBRONCHIAL ULTRASOUND (N/A)  1. CV- NSR 2. Pulm- some  cough, CXR stable, continue IS 3. Dispo- patient medically stable for discharge, pathology remains pending   LOS: 5 days    Ahmed Prima, ERIN 05/09/2014   Chart reviewed, patient examined, agree with above. She can go home and return to [facility] clinic Thursday. Her final path is still pending.

## 2014-05-09 NOTE — Progress Notes (Signed)
05/09/2014 5:42 AM  The patient ambulated about 500 feet in the hallway with the nurse. The patient tolerated the activity well.  Will continue to monitor the patient. Lupita Dawn

## 2014-05-09 NOTE — Progress Notes (Signed)
05/09/2014 3:24 PM  Pericardial drain site sutures removed per order and protocol.  Pt. Tolerated well. Encouraged her to leave steri strips on.   Carney [place]ers

## 2014-05-09 NOTE — Progress Notes (Signed)
05/09/2014 3:27 PM Discharge AVS meds taken today and those due this evening reviewed.  Encouraged pt. To pay close attention to suture removal site.  Minimal amount of drainage noted when removed.  Discussed signs and symptoms of infection and when to call md office.  Follow-up appointments and when to call md reviewed.  D/C IV and TELE.  Questions and concerns addressed.   D/C home per orders.  Carney Corners

## 2014-05-14 ENCOUNTER — Telehealth: Payer: Self-pay | Admitting: *Deleted

## 2014-05-14 ENCOUNTER — Other Ambulatory Visit: Payer: Self-pay | Admitting: *Deleted

## 2014-05-14 DIAGNOSIS — R918 Other nonspecific abnormal finding of lung field: Secondary | ICD-10-CM

## 2014-05-14 NOTE — Telephone Encounter (Signed)
Called patient to set up at [facility] on 05/21/14.  Patient verbalized understanding of appt time and place.

## 2014-05-21 ENCOUNTER — Encounter: Payer: Self-pay | Admitting: *Deleted

## 2014-05-21 ENCOUNTER — Other Ambulatory Visit: Payer: Self-pay

## 2014-05-21 ENCOUNTER — Ambulatory Visit: Payer: Self-pay | Attending: Internal Medicine | Admitting: Physical Therapy

## 2014-05-21 ENCOUNTER — Telehealth: Payer: Self-pay | Admitting: *Deleted

## 2014-05-21 ENCOUNTER — Ambulatory Visit: Payer: Self-pay | Admitting: Internal Medicine

## 2014-05-21 ENCOUNTER — Ambulatory Visit
Admission: RE | Admit: 2014-05-21 | Discharge: 2014-05-21 | Disposition: A | Discharge status: Home / Self Care | Payer: MEDICAID | Source: Ambulatory Visit | Attending: Radiation Oncology | Admitting: Radiation Oncology

## 2014-05-21 VITALS — BP 145/83 | HR 87 | Temp 98.3°F | Resp 18 | Wt 107.1 lb

## 2014-05-21 DIAGNOSIS — C50912 Malignant neoplasm of unspecified site of left female breast: Secondary | ICD-10-CM

## 2014-05-21 DIAGNOSIS — C7800 Secondary malignant neoplasm of unspecified lung: Secondary | ICD-10-CM

## 2014-05-21 DIAGNOSIS — R0602 Shortness of breath: Secondary | ICD-10-CM | POA: Insufficient documentation

## 2014-05-21 DIAGNOSIS — R918 Other nonspecific abnormal finding of lung field: Secondary | ICD-10-CM

## 2014-05-21 DIAGNOSIS — C50919 Malignant neoplasm of unspecified site of unspecified female breast: Secondary | ICD-10-CM | POA: Insufficient documentation

## 2014-05-21 MED ORDER — ALPRAZOLAM 0.5 MG PO TABS: 0.5000 mg | ORAL_TABLET | Freq: Three times a day (TID) | ORAL | Status: DC

## 2014-05-21 NOTE — Progress Notes (Signed)
Radiation Oncology         [phone] ________________________________  Initial outpatient Consultation  Name: Patricia Carter MRN: [id number]  Date: 05/21/2014  DOB: 08-23-1955  CC:No PCP Per Patient  Gaye Pollack, MD   REFERRING PHYSICIAN: Gaye Pollack, MD  DIAGNOSIS: Recurrent left breast cancer  HISTORY OF PRESENT ILLNESS::Patricia Carter is a 59 y.o. female who is seen out of the courtesy of Dr Cyndia Bent as part of the multidisciplinary thoracic oncology clinic area. The patient has a prior history of left breast cancer undergoing mastectomy and immediate reconstruction while living in the [place] area. This was performed in 2010. The patient did not receive any adjuvant radiation therapy hormonal therapy or chemotherapy. the patient began having some coughing and dyspnea last May.. She initially presented to urgent care facility and was diagnosed with bronchitis. She was treated with antibiotics and prednisone and did not improve the. More recently the patient symptoms worsened and she presented to the pulmonary clinic on December 30. The patient was seen by Dr. Melvyn Novas and a chest CT scan was performed. this chest CT scan revealed a right hilar mass with compression of the SVC and right upper lobe pulmonary artery. There is also obstruction of the right middle lobe bronchus as well as postobstructive collapse. In addition the patient was noted to have a significant pericardial effusion. Patient was subsequently admitted to the hospital. On January 5 the patient proceeded to undergo a  subxiphoid pericardial window flexible bronchoscopy, video bronchoscopy with endobronchial ultrasound. The patient was noted to have the bronchus intermedius diffusely narrowed with some mucosal irregularity, edema and signs of extrinsic compression. The orifice of the right middle lobe bronchus was opened but immediately thereafter the segmental bronchi were obstructed with endobronchial mass. The superior  segmental bronchus was also narrowed and deformed as well as the main lower lobe bronchus. Biopsies from this procedure revealed a poorly differentiated carcinoma special staining showed positive staining for estrogen and the patient was felt to have metastatic  breast cancer.  the tumor showed no HER-2/neu amplification. The patient has been discharged from the hospital and is breathing much easier in light of her pericardial window. She is still however continues to have dyspnea. Patient is now seen for consideration for palliative radiation therapy directed to her right central lung mass in light of endobronchial obstruction and SVC/pulmonary artery compression.  PREVIOUS RADIATION THERAPY: No  PAST MEDICAL HISTORY:  has a past medical history of Breast cancer.    PAST SURGICAL HISTORY: Past Surgical History  Procedure Laterality Date  . Mastectomy Left     2010  . Eye surgery      cataract surgery, wears contact left eye  . Subxyphoid pericardial window N/A 05/05/2014    Procedure: SUBXYPHOID PERICARDIAL WINDOW;  Surgeon: Gaye Pollack, MD;  Location: [facility] OR;  Service: Thoracic;  Laterality: N/A;  . Flexible bronchoscopy N/A 05/05/2014    Procedure: FLEXIBLE BRONCHOSCOPY;  Surgeon: Gaye Pollack, MD;  Location: [place];  Service: Thoracic;  Laterality: N/A;  . Video bronchoscopy with endobronchial ultrasound N/A 05/05/2014    Procedure: VIDEO BRONCHOSCOPY WITH ENDOBRONCHIAL ULTRASOUND;  Surgeon: Gaye Pollack, MD;  Location: [place];  Service: Thoracic;  Laterality: N/A;    FAMILY HISTORY: family history is not on file.  SOCIAL HISTORY:  reports that she has never smoked. She has never used smokeless tobacco. She reports that she drinks alcohol. She reports that she does not use illicit drugs.  ALLERGIES: Prednisone and  Zyrtec  MEDICATIONS:  Current Outpatient Prescriptions  Medication Sig Dispense Refill  . acetaminophen (TYLENOL) 325 MG tablet Take 650 mg by mouth every 6 (six) hours as  needed for headache.    . ALPRAZolam (XANAX) 0.5 MG tablet Take 1 tablet (0.5 mg total) by mouth 3 (three) times daily. For anxiety 30 tablet 0  . Naproxen Sodium (ALEVE PO) Take 1 tablet by mouth 2 (two) times daily as needed (headache).     . oxyCODONE (OXY IR/ROXICODONE) 5 MG immediate release tablet Take 1-2 tablets (5-10 mg total) by mouth every 4 (four) hours as needed for severe pain. 30 tablet 0   No current facility-administered medications for this encounter.    REVIEW OF SYSTEMS:  A 15 point review of systems is documented in the electronic medical record. This was obtained by the nursing staff. However, I reviewed this with the patient to discuss relevant findings and make appropriate changes.  Patient has much less pressure within the chest since her pericardial window. She denies any problems with hemoptysis at this time. She has dyspnea with exertion. Patient denies any new bony pain headaches dizziness or blurred vision.   PHYSICAL EXAM:  weight is 107 lb 1.6 oz (48.58 kg). Her temperature is 98.3 F (36.8 C). Her blood pressure is 145/83 and her pulse is 87. Her respiration is 18 and oxygen saturation is 99%.   BP 145/83 mmHg  Pulse 87  Temp(Src) 98.3 F (36.8 C)  Resp 18  Wt 107 lb 1.6 oz (48.58 kg)  SpO2 99%  General Appearance:    Alert, cooperative, no distress, appears stated age, accompanied by good friend and sister-in-law on evaluation today   Head:    Normocephalic, without obvious abnormality, atraumatic  Eyes:    PERRL, conjunctiva/corneas clear, EOM's intact,       Nose:   Nares normal, septum midline, mucosa normal, no drainage    or sinus tenderness  Throat:   Lips, mucosa, and tongue normal; teeth and gums normal  Neck:   Supple, symmetrical, trachea midline, no adenopathy;    thyroid:  no enlargement/tenderness/nodules; no carotid   bruit or JVD  Back:     Symmetric, no curvature, ROM normal, no CVA tenderness  Lungs:     Clear to auscultation  bilaterally, respirations unlabored  Chest Wall:    No tenderness or deformity   Heart:    Regular rate and rhythm, S1 and S2 normal, no murmur, rub   or gallop     Abdomen:     Soft, non-tender, bowel sounds active all four quadrants,    no masses, no organomegaly        Extremities:   Extremities normal, atraumatic, no cyanosis or edema  Pulses:   2+ and symmetric all extremities  Skin:   Skin color, texture, turgor normal, no rashes or lesions  Lymph nodes:   Cervical, supraclavicular, and axillary nodes normal  Neurologic:    normal strength, sensation and reflexes    throughout     ECOG = 2  2 - Symptomatic, <50% in bed during the day (Ambulatory and capable of all self care but unable to carry out any work activities. Up and about more than 50% of waking hours)  LABORATORY DATA:  Lab Results  Component Value Date   WBC 5.6 05/04/2014   HGB 14.3 05/04/2014   HCT 41.8 05/04/2014   MCV 87.1 05/04/2014   PLT 208 05/04/2014   NEUTROABS 3.7 05/04/2014   Lab Results  Component Value Date   NA 139 05/07/2014   K 4.1 05/07/2014   CL 106 05/07/2014   CO2 29 05/07/2014   GLUCOSE 93 05/07/2014   CREATININE 0.79 05/07/2014   CALCIUM 8.8 05/07/2014      RADIOGRAPHY: Dg Chest 2 View  05/09/2014   CLINICAL DATA:  Status post pericardial effusion drainage. Known right perihilar mass  EXAM: CHEST  2 VIEW  COMPARISON:  Chest CT April 30, 2014 and May 07, 2014  FINDINGS: Pericardial drain is no longer appreciable. There is no demonstrable pneumopericardium. There is no demonstrable pneumomediastinum or pneumothorax. The right hilar mass with right middle lobe consolidation remains without change. There is scarring and atelectasis in the left base. There is underlying emphysema. There is no new opacity. The heart size and pulmonary vascularity are within normal limits. Hilar regions appear unchanged. No bone lesions are appreciable.  IMPRESSION: Persistent right perihilar mass with  right middle lobe consolidation. Underlying emphysema. Scarring with atelectasis left base. Pericardial drain no longer present. Cardiac silhouette within normal limits. No pneumothorax.   Electronically Signed   By: Lowella Grip M.D.   On: 05/09/2014 08:01   Dg Chest 2 View  04/29/2014   CLINICAL DATA:  Shortness of breath x8 months. History of breast cancer.  EXAM: CHEST  2 VIEW  COMPARISON:  None.  FINDINGS: Mediastinum is unremarkable. Right midlung atelectatic changes are present. Underlying right scratch mass cannot be excluded. Mild blunting of both costophrenic angles. Tiny effusions cannot be excluded. No pneumothorax. Mild cardiomegaly with normal pulmonary vascularity. Postsurgical changes left breast. No focal bony abnormality.  IMPRESSION: Atelectatic changes right mid lung, close follow-up chest x-rays recommended to demonstrate clearing and to exclude an underlying central mass .   Electronically Signed   By: Marcello Moores  Register   On: 04/29/2014 15:10   Ct Angio Chest W/cm &/or Wo Cm  04/30/2014   CLINICAL DATA:  Shortness of breath, abnormal chest radiograph. Left breast cancer.  EXAM: CT ANGIOGRAPHY CHEST WITH CONTRAST  TECHNIQUE: Multidetector CT imaging of the chest was performed using the standard protocol during bolus administration of intravenous contrast. Multiplanar CT image reconstructions and MIPs were obtained to evaluate the vascular anatomy.  CONTRAST:  6mL OMNIPAQUE IOHEXOL 350 MG/ML SOLN  COMPARISON:  Chest radiograph 04/29/2014.  FINDINGS: Right upper lobe pulmonary arteries are attenuated due to compression by a right hilar mass. No filling defects in the pulmonary arteries to indicate pulmonary embolus.  There is a right hilar mass, which is contiguous with subcarinal adenopathy, measuring approximately 3.5 x 6.6 cm. Slight narrowing of the lower SVC is seen with mass effect on the distal right main pulmonary artery and its branches, causing attenuation in the right upper  lobe. The bronchus intermedius is narrowed. The right middle lobe bronchi are obstructed. Moderate pericardial effusion. Heart size normal. No axillary adenopathy.  Mild biapical pleural parenchymal scarring. There is masslike collapse/ consolidation in the right middle lobe. Masslike portion measures approximately 3.6 x 3.6 cm. 8 mm right lower lobe nodule (image 49). Scattered small nodular and ground-glass opacities in the right upper lobe. Linear scarring or atelectasis in the left lower lobe. Left lung is otherwise clear. Airway is otherwise unremarkable. A low-attenuation extrapleural nodule along the right lateral aspect of the mid thoracic spine (series 4, image 63) measures 1.0 x 1.3 cm and may represent a small nerve sheath tumor.  Incidental imaging of the upper abdomen shows the visualized portions of the liver, gallbladder, adrenal glands and  left kidney to be grossly unremarkable. Heterogeneity within the spleen may be due to phase of contrast. Difficult to definitively exclude true nodules within the spleen. Visualized portions of the pancreas and stomach are grossly unremarkable.  No worrisome lytic or sclerotic lesions.  Review of the MIP images confirms the above findings.  IMPRESSION: 1. Negative for pulmonary embolus. 2. Right hilar mass with compression of the SVC and right upper lobe pulmonary artery and obstruction of the right middle lobe bronchus. Right middle lobe mass and postobstructive collapse. Findings are highly worrisome for primary bronchogenic carcinoma. 3. Possible satellite right lower lobe nodule. 4. Moderate pericardial effusion. 5. Scattered small nodular and ground-glass opacities in the right upper lobe, likely infectious or inflammatory in etiology.   Electronically Signed   By: Lorin Picket M.D.   On: 04/30/2014 16:48   Dg Chest Port 1 View  05/07/2014   CLINICAL DATA:  Pneumothorax. Pericardial window, bronchoscopy, and endobronchial biopsy on 05/05/2014.  EXAM:  PORTABLE CHEST - 1 VIEW  COMPARISON:  05/06/2014  FINDINGS: Pericardial drain remains in place. Cardiac silhouette appears slightly smaller than on the prior study likely reflecting decreased pericardial fluid. No pneumothorax is seen. Retrocardiac opacity in the left lower lobe has mildly improved. There is persistent abnormal soft tissue density in the right hilum consistent with known mass with associated right middle lobe collapse/mass. Blunting of the left costophrenic angle is suggestive of a small left pleural effusion. There may also be a trace right pleural effusion.  IMPRESSION: 1. Improved aeration of the left lower lobe. Persistent right hilar mass and right middle lobe collapse. 2. No pneumothorax.   Electronically Signed   By: Logan Bores   On: 05/07/2014 07:43   Dg Chest Port 1 View  05/06/2014   CLINICAL DATA:  Pericardial drain.  EXAM: PORTABLE CHEST - 1 VIEW  COMPARISON:  May 05, 2014.  FINDINGS: Pericardial drain is again noted. No pneumothorax is noted. Stable cardiomediastinal silhouette. Increased left basilar opacity is noted concerning for pneumonia or atelectasis with mild associated pleural effusion. Minimal right basilar subsegmental atelectasis is noted as well. Bony thorax is intact.  IMPRESSION: Pericardial drain is unchanged in position. Increased left basilar opacity is noted concerning for pneumonia or atelectasis with associated pleural effusion. Minimal right basilar subsegmental atelectasis is noted.   Electronically Signed   By: Sabino Dick M.D.   On: 05/06/2014 08:03   Dg Chest Port 1 View  05/05/2014   CLINICAL DATA:  Pericardial effusion  EXAM: PORTABLE CHEST - 1 VIEW  COMPARISON:  04/29/2014  FINDINGS: Cardiac shadow is significantly reduced in size when compare with the prior exam. A pericardial drain is noted in place. No focal infiltrate or sizable effusion is seen. No acute bony abnormality is seen. The right hilar mass is less present on the current exam likely  related to patient positioning and rotation to the right.  IMPRESSION: Significant reduction cardiac size consistent with pericardial drain. The catheter is noted over the midline in satisfactory position.  These results were called by telephone at the time of interpretation on 05/05/2014 at 2:15 pm to [facility], the patients nurse, who verbally acknowledged these results.   Electronically Signed   By: Inez Catalina M.D.   On: 05/05/2014 14:15      IMPRESSION: Recurrent left breast cancer. Pathology from the patient's recent bronchoscopy shows poorly differentiated carcinoma. The staining is estrogen receptor positive and would be consistent with the patient's prior history of left breast cancer.  Patient has a significant mass in the right central chest and with further progression could develop collapse of part of her right lung. She will also be at risk for this superior vena cava syndrome progression in light of the compression over the superior vena cava and right pulmonary artery. I would recommend palliative radiation therapy in this situation. I discussed the treatment course extending over approximately 3 weeks, anticipated side effects and potential benefits of this treatment with the patient and her accompanying friend and sister-in-law. Patient understands this recommendation but is not willing at this time to schedule treatment. She would like to discuss this further with her family  And may consider treatments in the [place] area where most of her family members are located. Patient will call if she wishes to proceed with treatments here in [place]. She will also need referral to breast medical oncology for further management and treatment. The patient does understand the urgency of her situation and knows that a expeditious decision concerning her treatment is necessary to avoid further problems.     ------------------------------------------------  Blair Promise, PhD, MD

## 2014-05-21 NOTE — Telephone Encounter (Signed)
Called patient to give her an update from cancer conference and appt time change.  I left vm message to call me

## 2014-05-21 NOTE — [facility] Oncology Navigator Note (Unsigned)
Spoke with patient at thoracic clinic today.  Dr. Sondra Come spoke with patient about disease and radiation treatment option.  I had patient to fill out paper work to obtain records at [place] relating to her breast cancer treatment.  I placed paper in [facility].  Patient stated she would to think about treatment options over the weekend before she makes any decisions.  I will call her at a later time to see what she would like to do.

## 2014-05-21 NOTE — Therapy (Signed)
[place], [place], [address] Phone: [phone]   Fax:  [phone]  Physical Therapy Evaluation  Patient Details  Name: Patricia Carter MRN: [id number] Date of Birth: 1955/06/21 Referring Provider:  Curt Bears, MD  Encounter Date: 05/21/2014      PT End of Session - 05/21/14 1639    Visit Number 1   Number of Visits 1   PT Start Time 1600   PT Stop Time 6734   PT Time Calculation (min) 15 min   Behavior During Therapy Anxious      Past Medical History  Diagnosis Date  . Breast cancer     Past Surgical History  Procedure Laterality Date  . Mastectomy Left     2010  . Eye surgery      cataract surgery, wears contact left eye  . Subxyphoid pericardial window N/A 05/05/2014    Procedure: SUBXYPHOID PERICARDIAL WINDOW;  Surgeon: Gaye Pollack, MD;  Location: [facility] OR;  Service: Thoracic;  Laterality: N/A;  . Flexible bronchoscopy N/A 05/05/2014    Procedure: FLEXIBLE BRONCHOSCOPY;  Surgeon: Gaye Pollack, MD;  Location: [place];  Service: Thoracic;  Laterality: N/A;  . Video bronchoscopy with endobronchial ultrasound N/A 05/05/2014    Procedure: VIDEO BRONCHOSCOPY WITH ENDOBRONCHIAL ULTRASOUND;  Surgeon: Gaye Pollack, MD;  Location: [facility] OR;  Service: Thoracic;  Laterality: N/A;    There were no vitals taken for this visit.  Visit Diagnosis:  Shortness of breath - Plan: PT plan of care cert/re-cert      Subjective Assessment - 05/21/14 1626    Symptoms Pt. developed a cough 08/2013; follow-up chest x-ray at [facility] in 11/2013 and was told to get CT chest but didn't at that time; had CT angiogram 04/30/14.  Had pericardial effusion drained 05/05/14.   Pertinent History h/o left breast cancer in 2010; s/p mastectomy with reconstruction.  Reports she had some lymph nodes removed but she's not sure how many; no radiation treatment done.  Current might middle and right lower lobe endobronchial  differentiated carcinoma which is ER+, PR+ ; appears to be metastatic breast cancer.  Has a bronchial obstruction that may benefit from radiation; pt. may also see breast medical oncologist.                                            Currently in Pain? Other (Comment)  Has a feeling of tightness with taking a deep breath.          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  No   Is the patient reluctant to leave their home because of a fear of falling?  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   Posture/Postural Control   Posture/Postural Control Postural limitations   Postural Limitations Forward head   AROM   Overall AROM  Deficits   Overall AROM Comments mild active trunk ROM limitations due to tightenss from stitches (from pericardial effusion)   Ambulation/Gait   Ambulation/Gait Yes   Ambulation/Gait Assistance 6: Modified independent (Device/Increase time)   Ambulation Distance (Feet) --  limited by shortness of breath currently--can walk about 15'   Stairs Yes   Stairs Assistance 6: Modified independent (Device/Increase time)  reports she must go slowly up 8 steps to access apt. (SOB)                          PT Education - 05/21/14 1638     Education provided Yes   Education Details posture, breathing, walking, energy conservation   Person(s) Educated Patient;Child(ren);Other (comment)  friend   Methods Explanation;Handout   Comprehension Verbalized understanding      Also discussed with patient her breast cancer history; she is not sure how many lymph nodes she had removed and reports little knowledge of lymphedema, but she does have a medical alert bracelet on left arm for avoiding needle sticks and blood pressure checks on that limb.  Briefly discussed with her her low risk of lymphedema; but that it continues even 5+ years post-treatment, so to continue her precautions.  Advised her that if she were to notice swelling or heaviness of the left arm to bring it to the attention of one of her doctors.         Lung Clinic Goals - 05/21/14 1641    Patient will be able to verbalize understanding of the benefit of exercise to decrease fatigue.   Status Achieved   Patient will be able to verbalize the importance of posture.   Status Achieved   Patient will be able to demonstrate diaphragmatic breathing for improved lung function.   Status Achieved   Patient will be able to verbalize understanding of the role of physical therapy to prevent functional decline and who to contact if physical therapy is needed.   Status Achieved   Additional Goals   Additional Goals Yes   Additional Goal #1   Title Patient will be aware of slight lymphedema risk and precautions for avoiding this.   Status Achieved             Plan - 05/21/14 1639    Clinical Impression Statement Patient with a background as a fitness instructor currently with limited tolerance for activity due to shortness of breath.   PT Frequency One time visit   PT Treatment/Interventions Patient/family education   PT Next Visit Plan None at this time   Consulted and Agree with Plan of Care Patient         Problem List Patient Active Problem List   Diagnosis  Date Noted  . Acute pericardial effusion 05/04/2014  . Lung mass 04/30/2014  . Dyspnea 04/29/2014    Dashanna Kinnamon 05/21/2014, 5:06 PM  [place], [place], [address] Phone: [phone]   Fax:  [phone]  Serafina Royals, [place]

## 2014-05-26 ENCOUNTER — Telehealth: Payer: Self-pay | Admitting: *Deleted

## 2014-05-26 NOTE — Telephone Encounter (Signed)
Called patient to check on patient and her plans on treatment.  She stated she would like to be seen by medical oncology here at [facility].  I have notified Lattie Haw regarding patients request.

## 2014-05-27 ENCOUNTER — Ambulatory Visit: Payer: Self-pay | Admitting: Internal Medicine

## 2014-06-01 ENCOUNTER — Other Ambulatory Visit: Payer: Self-pay | Admitting: Surgery

## 2014-06-01 DIAGNOSIS — I309 Acute pericarditis, unspecified: Secondary | ICD-10-CM

## 2014-06-02 ENCOUNTER — Ambulatory Visit
Admission: RE | Admit: 2014-06-02 | Discharge: 2014-06-02 | Disposition: A | Discharge status: Home / Self Care | Payer: Medicaid Other | Source: Ambulatory Visit | Attending: Radiation Oncology | Admitting: Radiation Oncology

## 2014-06-02 VITALS — BP 134/73 | HR 82 | Resp 16 | Wt 105.0 lb

## 2014-06-02 DIAGNOSIS — C50912 Malignant neoplasm of unspecified site of left female breast: Secondary | ICD-10-CM | POA: Diagnosis not present

## 2014-06-02 DIAGNOSIS — R918 Other nonspecific abnormal finding of lung field: Secondary | ICD-10-CM

## 2014-06-03 ENCOUNTER — Ambulatory Visit (INDEPENDENT_AMBULATORY_CARE_PROVIDER_SITE_OTHER): Payer: Self-pay | Admitting: Surgery

## 2014-06-03 ENCOUNTER — Encounter: Payer: Self-pay | Admitting: *Deleted

## 2014-06-03 ENCOUNTER — Encounter: Payer: Self-pay | Admitting: Surgery

## 2014-06-03 ENCOUNTER — Ambulatory Visit
Admission: RE | Admit: 2014-06-03 | Discharge: 2014-06-03 | Disposition: A | Discharge status: Home / Self Care | Payer: Self-pay | Source: Ambulatory Visit | Attending: Surgery | Admitting: Surgery

## 2014-06-03 VITALS — BP 122/82 | HR 78 | Resp 16 | Ht 63.0 in | Wt 105.0 lb

## 2014-06-03 DIAGNOSIS — I3139 Other pericardial effusion (noninflammatory): Secondary | ICD-10-CM

## 2014-06-03 DIAGNOSIS — R918 Other nonspecific abnormal finding of lung field: Secondary | ICD-10-CM

## 2014-06-03 DIAGNOSIS — R599 Enlarged lymph nodes, unspecified: Secondary | ICD-10-CM

## 2014-06-03 DIAGNOSIS — I313 Pericardial effusion (noninflammatory): Secondary | ICD-10-CM

## 2014-06-03 DIAGNOSIS — R222 Localized swelling, mass and lump, trunk: Secondary | ICD-10-CM

## 2014-06-03 DIAGNOSIS — I319 Disease of pericardium, unspecified: Secondary | ICD-10-CM

## 2014-06-03 DIAGNOSIS — R590 Localized enlarged lymph nodes: Secondary | ICD-10-CM

## 2014-06-03 DIAGNOSIS — Z9889 Other specified postprocedural states: Secondary | ICD-10-CM

## 2014-06-03 DIAGNOSIS — I309 Acute pericarditis, unspecified: Secondary | ICD-10-CM

## 2014-06-03 NOTE — Progress Notes (Signed)
  Radiation Oncology         [phone] ________________________________  Name: Reine Bristow MRN: [id number]  Date: 06/02/2014  DOB: 12-14-1955  SIMULATION AND TREATMENT PLANNING NOTE    ICD-9-CM ICD-10-CM   1. Recurrent cancer of left breast 174.9 C50.912     DIAGNOSIS:  Recurrent left breast cancer with right central chest recurrence resulting in lung collapse and impending superior vena cava syndrome  NARRATIVE:  The patient was brought to the [facility].  Identity was confirmed.  All relevant records and images related to the planned course of therapy were reviewed.  The patient freely provided informed written consent to proceed with treatment after reviewing the details related to the planned course of therapy. The consent form was witnessed and verified by the simulation staff.  Then, the patient was set-up in a stable reproducible  supine position for radiation therapy.  CT images were obtained.  Surface markings were placed.  The CT images were loaded into the planning software.  Then the target and avoidance structures were contoured.  Treatment planning then occurred.  The radiation prescription was entered and confirmed.  Then, I designed and supervised the construction of a total of 3 medically necessary complex treatment devices.  I have requested : 3D Simulation  I have requested a DVH of the following structures: heart, lungs, GTV.PTV esophagus.  I have ordered:dose calc.  PLAN:  The patient will receive 35 Gy in 14 fractions.  ________________________________ -----------------------------------  Blair Promise, PhD, MD

## 2014-06-03 NOTE — [facility] Oncology Navigator Note (Unsigned)
Patricia Carter called today.  Dr. Cyndia Bent gave her my phone number to call and follow up on med onc appt with breast team.  I spoke with Patricia Carter.  I explained I have spoken with the breast team several time about appt for med onc.  The team is waiting for records form [facility] and then set up appt with them.  She verbalized understanding.  She did state she has started radiation therapy.  It was nice hearing from Patricia Carter today.  I will notify breast team that she has called and wondering about appt.

## 2014-06-05 ENCOUNTER — Telehealth: Payer: Self-pay | Admitting: *Deleted

## 2014-06-05 ENCOUNTER — Encounter: Payer: Self-pay | Admitting: Surgery

## 2014-06-05 NOTE — Telephone Encounter (Signed)
Received paperwork from [facility].  I called and left a message for the pt to return my call so I can schedule her w/ a med onc.

## 2014-06-05 NOTE — Progress Notes (Signed)
     HPI:  Patient returns for routine postoperative follow-up having undergone subxyphoid pericardial window, bronchoscopy with biopsy of an obstructing right middle and lobe endobronchial lesion, and EBUS for biopsy of subcarinal lymph nodes on 05/05/2014. The endobronchial biopsies showed poorly differentiated carcinoma with immunohistochemical staining suggesting that this was recurrent breast cancer. The biopsies of the subcarinal lymph nodes showed carcinoma. The pericardial biopsy and cytology of the fluid were negative.  The patient's early postoperative recovery while in the hospital was notable for an uncomplicated postop course. Since hospital discharge the patient reports that she has felt fairly well. She still has some cough. She has seen Dr. Sondra Come and undergone simulation for XRT. She is waiting to hear from medical oncology concerning an appt. This was all somewhat delayed because the patient was not sure if she wanted to pursue treatment here or go to the [place] area where she has some family.   Current Outpatient Prescriptions  Medication Sig Dispense Refill  . acetaminophen (TYLENOL) 325 MG tablet Take 650 mg by mouth every 6 (six) hours as needed for headache.    . ALPRAZolam (XANAX) 0.5 MG tablet Take 1 tablet (0.5 mg total) by mouth 3 (three) times daily. For anxiety 30 tablet 0  . Naproxen Sodium (ALEVE PO) Take 1 tablet by mouth 2 (two) times daily as needed (headache).     . oxyCODONE (OXY IR/ROXICODONE) 5 MG immediate release tablet Take 1-2 tablets (5-10 mg total) by mouth every 4 (four) hours as needed for severe pain. 30 tablet 0   No current facility-administered medications for this visit.    Physical Exam: BP 122/82 mmHg  Pulse 78  Resp 16  Ht 5\' 3"  (1.6 m)  Wt 105 lb (47.628 kg)  BMI 18.60 kg/m2  SpO2 98% She looks well Lung exam is clear Cardiac exam shows a regular rate and rhythm. The subxyphoid incision is healing well  Diagnostic  Tests:  CLINICAL DATA: Acute shortness of breath ; subxiphoid pericardial window created on May 05, 2014 or pericardial effusions; bronchoscopy the same day  EXAM: CHEST 2 VIEW  COMPARISON: PA and lateral chest x-ray of May 09, 2014  FINDINGS: The lungs remain hyperinflated. Atelectasis at the left lung base has cleared. There remain prominent right hilar lung markings unchanged from the previous study. There is no alveolar infiltrate. The heart and pulmonary vascularity are normal. The bony thorax is unremarkable.  IMPRESSION: COPD with interval clearing of left lower lobe atelectasis. Persistent right perihilar soft tissue mass-like density is little changed from the previous study.   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 The pericardial fluid was negative for malignant cells but I am still suspicious that this is a malignant effusion given her history. She has decided to undergo treatment here and is scheduled for XRT to the right lung to try to prevent bronchial obstruction and SVC occlusion. She is awaiting an appt to see medical oncology. I asked her to avoid strenuous physical activity especially using the abdominal muscles for 3 months to allow the incision to heal.  Plan:  She will follow up with medical oncology and radiation oncology and will call me if she has any problems with her incision.

## 2014-06-08 ENCOUNTER — Telehealth: Payer: Self-pay | Admitting: Oncology

## 2014-06-08 NOTE — Telephone Encounter (Signed)
Called Patricia Carter because she is having pain in her left side from the tattoo she received in CT SIM on 06/03/14.  Patricia Carter said the site was a little sore the day after she received it.  The next day it was "really sore."  She said it is a little better today.  She said it is not swollen.  She would like to see Dr. Sondra Come before she starts treatment tomorrow.  Advised her to come in at 2:45 to see Dr. Sondra Come before treatment.  Patricia Carter verbalized agreement.

## 2014-06-09 ENCOUNTER — Ambulatory Visit: Payer: Medicaid Other | Admitting: Radiation Oncology

## 2014-06-09 ENCOUNTER — Ambulatory Visit: Payer: MEDICAID | Admitting: Radiation Oncology

## 2014-06-09 DIAGNOSIS — C50912 Malignant neoplasm of unspecified site of left female breast: Secondary | ICD-10-CM | POA: Diagnosis not present

## 2014-06-10 ENCOUNTER — Encounter: Payer: Self-pay | Admitting: Radiation Oncology

## 2014-06-10 ENCOUNTER — Ambulatory Visit
Admission: RE | Admit: 2014-06-10 | Discharge: 2014-06-10 | Disposition: A | Discharge status: Home / Self Care | Payer: MEDICAID | Source: Ambulatory Visit | Attending: Radiation Oncology | Admitting: Radiation Oncology

## 2014-06-10 ENCOUNTER — Ambulatory Visit
Admission: RE | Admit: 2014-06-10 | Discharge: 2014-06-10 | Disposition: A | Discharge status: Home / Self Care | Payer: Medicaid Other | Source: Ambulatory Visit | Attending: Radiation Oncology | Admitting: Radiation Oncology

## 2014-06-10 ENCOUNTER — Ambulatory Visit: Payer: Medicaid Other

## 2014-06-10 VITALS — BP 130/82 | HR 93 | Temp 97.5°F | Resp 20

## 2014-06-10 DIAGNOSIS — R918 Other nonspecific abnormal finding of lung field: Secondary | ICD-10-CM

## 2014-06-10 DIAGNOSIS — C50912 Malignant neoplasm of unspecified site of left female breast: Secondary | ICD-10-CM

## 2014-06-10 DIAGNOSIS — Z510 Encounter for antineoplastic radiation therapy: Secondary | ICD-10-CM | POA: Insufficient documentation

## 2014-06-10 DIAGNOSIS — J9819 Other pulmonary collapse: Secondary | ICD-10-CM | POA: Insufficient documentation

## 2014-06-10 MED ORDER — ALPRAZOLAM 0.5 MG PO TABS: 0.5000 mg | ORAL_TABLET | Freq: Three times a day (TID) | ORAL | Status: DC

## 2014-06-10 MED ORDER — LORAZEPAM 0.5 MG PO TABS
0.5000 mg | ORAL_TABLET | Freq: Once | ORAL | Status: AC
  Administered 2014-06-10: 0.5 mg via SUBLINGUAL
  Filled 2014-06-10: qty 1

## 2014-06-10 NOTE — Progress Notes (Signed)
  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 . . . . . . . The patient presents for routine under treatment assessment.                                   The patient is quite anxious with starting her treatment. She did require Ativan prior to her set up and treatment today. Her breathing has been stable over the past few days. She denies any swelling in her face or upper chest.  She did have some discomfort where her left lateral chest wall tattoo was placed but this has essentially cleared up at this time.                                 Set-up films were reviewed.                                 The chart was checked. Physical Findings. . .  oral temperature is 97.5 F (36.4 C). Her blood pressure is 130/82 and her pulse is 93. Her respiration is 20 and oxygen saturation is 100%. . Decreased breath sounds are noted in the right upper lung field. The heart has a regular rhythm and rate.  No soreness or bruising noted in the left lateral chest wall tattoo area. Impression . . . . . . . The patient is tolerating radiation. Plan . . . . . . . . . . . . Continue treatment as planned.  ________________________________   Blair Promise, PhD, MD

## 2014-06-10 NOTE — Progress Notes (Signed)
Patient given 0.5 mg of ativan sublingual per Dr. Sondra Come.

## 2014-06-10 NOTE — Progress Notes (Signed)
Patricia Carter reports pain at a 3/10 in her left chest, especially with talking, where she had her placement tattoo.  The site is not red or swollen  She reports the site was very painful two days after she received it.  It has eased up but still hurts when she coughs.  She reports the other tattoos were not painful.

## 2014-06-10 NOTE — Progress Notes (Signed)
  Radiation Oncology         [phone] ________________________________  Name: Patricia Carter MRN: [id number]  Date: 06/10/2014  DOB: 28-Jun-1955  Simulation Verification Note    ICD-9-CM ICD-10-CM   1. Recurrent cancer of left breast 174.9 C50.912     Status: outpatient  NARRATIVE: The patient was brought to the treatment unit and placed in the planned treatment position. The clinical setup was verified. Then port films were obtained and uploaded to the radiation oncology medical record software.  The treatment beams were carefully compared against the planned radiation fields. The position location and shape of the radiation fields was reviewed. They targeted volume of tissue appears to be appropriately covered by the radiation beams. Organs at risk appear to be excluded as planned.  Based on my personal review, I approved the simulation verification. The patient's treatment will proceed as planned.  -----------------------------------  Blair Promise, PhD, MD

## 2014-06-11 ENCOUNTER — Ambulatory Visit
Admission: RE | Admit: 2014-06-11 | Discharge: 2014-06-11 | Disposition: A | Discharge status: Home / Self Care | Payer: Medicaid Other | Source: Ambulatory Visit | Attending: Radiation Oncology | Admitting: Radiation Oncology

## 2014-06-11 ENCOUNTER — Ambulatory Visit: Payer: Medicaid Other

## 2014-06-11 DIAGNOSIS — C50912 Malignant neoplasm of unspecified site of left female breast: Secondary | ICD-10-CM | POA: Diagnosis not present

## 2014-06-11 MED ORDER — RADIAPLEXRX EX GEL
Freq: Once | CUTANEOUS | Status: AC
  Administered 2014-06-10: 16:00:00 via TOPICAL

## 2014-06-11 NOTE — Addendum Note (Signed)
Encounter addended by: Jacqulyn Liner, RN on: 06/11/2014  8:34 AM<BR>     Documentation filed: Inpatient MAR

## 2014-06-11 NOTE — Addendum Note (Signed)
Encounter addended by: Jacqulyn Liner, RN on: 06/11/2014  8:30 AM<BR>     Documentation filed: Dx Association, Medications, Orders

## 2014-06-12 ENCOUNTER — Ambulatory Visit
Admission: RE | Admit: 2014-06-12 | Discharge: 2014-06-12 | Disposition: A | Discharge status: Home / Self Care | Payer: Medicaid Other | Source: Ambulatory Visit | Attending: Radiation Oncology | Admitting: Radiation Oncology

## 2014-06-12 ENCOUNTER — Ambulatory Visit
Admission: RE | Admit: 2014-06-12 | Discharge: 2014-06-12 | Disposition: A | Discharge status: Home / Self Care | Payer: MEDICAID | Source: Ambulatory Visit | Attending: Radiation Oncology | Admitting: Radiation Oncology

## 2014-06-12 DIAGNOSIS — C50912 Malignant neoplasm of unspecified site of left female breast: Secondary | ICD-10-CM | POA: Diagnosis not present

## 2014-06-12 DIAGNOSIS — R918 Other nonspecific abnormal finding of lung field: Secondary | ICD-10-CM

## 2014-06-12 NOTE — Progress Notes (Signed)
Pt here for patient teaching.  Radiation and You booklet given with areas of pertinence flagged and highlighted.  Reviewed fatigue, skin changes, throat changes and cough . Pt able to give teach back of to pat skin and use unscented/gentle soap,apply Radiaplex bid and avoid applying anything to skin within 4 hours of treatment. Pt demonstrated understanding of information given and will contact nursing with any questions or concerns.

## 2014-06-15 ENCOUNTER — Ambulatory Visit
Admission: RE | Admit: 2014-06-15 | Discharge: 2014-06-15 | Disposition: A | Discharge status: Home / Self Care | Payer: Medicaid Other | Source: Ambulatory Visit | Attending: Radiation Oncology | Admitting: Radiation Oncology

## 2014-06-15 DIAGNOSIS — C50912 Malignant neoplasm of unspecified site of left female breast: Secondary | ICD-10-CM | POA: Diagnosis not present

## 2014-06-16 ENCOUNTER — Encounter: Payer: Self-pay | Admitting: Radiation Oncology

## 2014-06-16 ENCOUNTER — Ambulatory Visit
Admission: RE | Admit: 2014-06-16 | Discharge: 2014-06-16 | Disposition: A | Discharge status: Home / Self Care | Payer: MEDICAID | Source: Ambulatory Visit | Attending: Radiation Oncology | Admitting: Radiation Oncology

## 2014-06-16 ENCOUNTER — Telehealth: Payer: Self-pay | Admitting: *Deleted

## 2014-06-16 ENCOUNTER — Ambulatory Visit
Admission: RE | Admit: 2014-06-16 | Discharge: 2014-06-16 | Disposition: A | Discharge status: Home / Self Care | Payer: Medicaid Other | Source: Ambulatory Visit | Attending: Radiation Oncology | Admitting: Radiation Oncology

## 2014-06-16 VITALS — BP 132/88 | HR 80 | Temp 98.1°F | Resp 20 | Ht 63.0 in | Wt 107.9 lb

## 2014-06-16 DIAGNOSIS — C50912 Malignant neoplasm of unspecified site of left female breast: Secondary | ICD-10-CM | POA: Diagnosis not present

## 2014-06-16 NOTE — Progress Notes (Signed)
  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 . . . . . . . The patient presents for routine under treatment assessment.                                   The patient feels her breathing is already improving. She denies any swallowing difficulties.  she reports a cough for a few minutes after her radiation treatment.                                 Set-up films were reviewed.                                 The chart was checked. Physical Findings. . .  height is 5\' 3"  (1.6 m) and weight is 107 lb 14.4 oz (48.943 kg). Her oral temperature is 98.1 F (36.7 C). Her blood pressure is 132/88 and her pulse is 80. Her respiration is 20 and oxygen saturation is 100%. . The lungs are clear. The heart has a regular rhythm and rate. Impression . . . . . . . The patient is tolerating radiation. Plan . . . . . . . . . . . . Continue treatment as planned.  ________________________________   Blair Promise, PhD, MD

## 2014-06-16 NOTE — Progress Notes (Signed)
Patricia Carter has completed 5 fractions to her right chest.  She denies having any pain.  She reports having a sore throat yesterday afternoon that was gone this morning.  She reports an improvement in shortness of breath.  She still notices it with activity.  She reports fatigue.  She denies any skin irritation.  She is using radiaplex.   BP 132/88 mmHg  Pulse 80  Temp(Src) 98.1 F (36.7 C) (Oral)  Resp 20  Ht 5\' 3"  (1.6 m)  Wt 107 lb 14.4 oz (48.943 kg)  BMI 19.12 kg/m2  SpO2 100%

## 2014-06-16 NOTE — Telephone Encounter (Signed)
On 06-16-14 fax medical records to [facility] it was consult note, sim & tx planning note.

## 2014-06-17 ENCOUNTER — Ambulatory Visit
Admission: RE | Admit: 2014-06-17 | Discharge: 2014-06-17 | Disposition: A | Discharge status: Home / Self Care | Payer: Medicaid Other | Source: Ambulatory Visit | Attending: Radiation Oncology | Admitting: Radiation Oncology

## 2014-06-17 DIAGNOSIS — C50912 Malignant neoplasm of unspecified site of left female breast: Secondary | ICD-10-CM | POA: Diagnosis not present

## 2014-06-18 ENCOUNTER — Ambulatory Visit
Admission: RE | Admit: 2014-06-18 | Discharge: 2014-06-18 | Disposition: A | Discharge status: Home / Self Care | Payer: Medicaid Other | Source: Ambulatory Visit | Attending: Radiation Oncology | Admitting: Radiation Oncology

## 2014-06-18 DIAGNOSIS — C50912 Malignant neoplasm of unspecified site of left female breast: Secondary | ICD-10-CM | POA: Diagnosis not present

## 2014-06-19 ENCOUNTER — Ambulatory Visit
Admission: RE | Admit: 2014-06-19 | Discharge: 2014-06-19 | Disposition: A | Discharge status: Home / Self Care | Payer: Medicaid Other | Source: Ambulatory Visit | Attending: Radiation Oncology | Admitting: Radiation Oncology

## 2014-06-19 DIAGNOSIS — R918 Other nonspecific abnormal finding of lung field: Secondary | ICD-10-CM

## 2014-06-19 DIAGNOSIS — C50912 Malignant neoplasm of unspecified site of left female breast: Secondary | ICD-10-CM | POA: Diagnosis not present

## 2014-06-19 MED ORDER — RADIAPLEXRX EX GEL
Freq: Once | CUTANEOUS | Status: AC
  Administered 2014-06-19: 17:00:00 via TOPICAL

## 2014-06-22 ENCOUNTER — Ambulatory Visit
Admission: RE | Admit: 2014-06-22 | Discharge: 2014-06-22 | Disposition: A | Discharge status: Home / Self Care | Payer: Medicaid Other | Source: Ambulatory Visit | Attending: Radiation Oncology | Admitting: Radiation Oncology

## 2014-06-22 DIAGNOSIS — C50912 Malignant neoplasm of unspecified site of left female breast: Secondary | ICD-10-CM | POA: Diagnosis not present

## 2014-06-23 ENCOUNTER — Ambulatory Visit
Admission: RE | Admit: 2014-06-23 | Discharge: 2014-06-23 | Disposition: A | Discharge status: Home / Self Care | Payer: Medicaid Other | Source: Ambulatory Visit | Attending: Radiation Oncology | Admitting: Radiation Oncology

## 2014-06-23 ENCOUNTER — Encounter: Payer: Self-pay | Admitting: Radiation Oncology

## 2014-06-23 ENCOUNTER — Ambulatory Visit
Admission: RE | Admit: 2014-06-23 | Discharge: 2014-06-23 | Disposition: A | Discharge status: Home / Self Care | Payer: MEDICAID | Source: Ambulatory Visit | Attending: Radiation Oncology | Admitting: Radiation Oncology

## 2014-06-23 VITALS — BP 129/80 | HR 86 | Temp 97.8°F | Resp 16 | Ht 63.0 in | Wt 109.1 lb

## 2014-06-23 DIAGNOSIS — C50912 Malignant neoplasm of unspecified site of left female breast: Secondary | ICD-10-CM

## 2014-06-23 NOTE — Progress Notes (Addendum)
Patricia Carter has completed 10 fractions to her right chest.  She reports pain in her right chest under her rib cage.  She is rating it at 2/10 and said it may have been caused by "doing ab work yesterday."  She reports having nausea at bedtime.  She has not vomited. She does reports a poor appetite.  She denies trouble swallowing but has noticed a lump in her throat.  Her weight is stable.  She reports having an increased cough after treatment lat lasts for 3-4 minutes.  She reports her shortness of breath is better.  Her oxygen saturation today on room air was 100%.  She denies any skin irritation and is using radiaplex.  BP 129/80 mmHg  Pulse 86  Temp(Src) 97.8 F (36.6 C) (Oral)  Resp 16  Ht 5\' 3"  (1.6 m)  Wt 109 lb 1.6 oz (49.487 kg)  BMI 19.33 kg/m2  SpO2 100%

## 2014-06-23 NOTE — Progress Notes (Signed)
  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 . . . . . . . The patient presents for routine under treatment assessment.                                   The patient has noticed mild "lump in her throat". She is able to swallow solids and liquids at this time. Overall her breathing has improved during the course of treatment.                                 Set-up films were reviewed.                                 The chart was checked. Physical Findings. . .  height is 5\' 3"  (1.6 m) and weight is 109 lb 1.6 oz (49.487 kg). Her oral temperature is 97.8 F (36.6 C). Her blood pressure is 129/80 and her pulse is 86. Her respiration is 16 and oxygen saturation is 100%. . The lungs are clear. The heart has a regular rhythm and rate. Impression . . . . . . . The patient is tolerating radiation. Plan . . . . . . . . . . . . Continue treatment as planned.  ________________________________   Blair Promise, PhD, MD

## 2014-06-24 ENCOUNTER — Ambulatory Visit
Admission: RE | Admit: 2014-06-24 | Discharge: 2014-06-24 | Disposition: A | Discharge status: Home / Self Care | Payer: Medicaid Other | Source: Ambulatory Visit | Attending: Radiation Oncology | Admitting: Radiation Oncology

## 2014-06-24 DIAGNOSIS — C50912 Malignant neoplasm of unspecified site of left female breast: Secondary | ICD-10-CM | POA: Diagnosis not present

## 2014-06-25 ENCOUNTER — Ambulatory Visit
Admission: RE | Admit: 2014-06-25 | Discharge: 2014-06-25 | Disposition: A | Discharge status: Home / Self Care | Payer: Medicaid Other | Source: Ambulatory Visit | Attending: Radiation Oncology | Admitting: Radiation Oncology

## 2014-06-25 DIAGNOSIS — C50912 Malignant neoplasm of unspecified site of left female breast: Secondary | ICD-10-CM | POA: Diagnosis not present

## 2014-06-26 ENCOUNTER — Ambulatory Visit
Admission: RE | Admit: 2014-06-26 | Discharge: 2014-06-26 | Disposition: A | Discharge status: Home / Self Care | Payer: Medicaid Other | Source: Ambulatory Visit | Attending: Radiation Oncology | Admitting: Radiation Oncology

## 2014-06-26 DIAGNOSIS — C50912 Malignant neoplasm of unspecified site of left female breast: Secondary | ICD-10-CM | POA: Diagnosis not present

## 2014-06-29 ENCOUNTER — Ambulatory Visit
Admission: RE | Admit: 2014-06-29 | Discharge: 2014-06-29 | Disposition: A | Discharge status: Home / Self Care | Payer: Medicaid Other | Source: Ambulatory Visit | Attending: Radiation Oncology | Admitting: Radiation Oncology

## 2014-06-29 ENCOUNTER — Telehealth: Payer: Self-pay | Admitting: Oncology

## 2014-06-29 ENCOUNTER — Ambulatory Visit
Admission: RE | Admit: 2014-06-29 | Discharge: 2014-06-29 | Disposition: A | Discharge status: Home / Self Care | Payer: MEDICAID | Source: Ambulatory Visit | Attending: Radiation Oncology | Admitting: Radiation Oncology

## 2014-06-29 ENCOUNTER — Other Ambulatory Visit: Payer: Self-pay | Admitting: *Deleted

## 2014-06-29 ENCOUNTER — Encounter: Payer: Self-pay | Admitting: Radiation Oncology

## 2014-06-29 ENCOUNTER — Other Ambulatory Visit: Payer: Self-pay | Admitting: Oncology

## 2014-06-29 ENCOUNTER — Ambulatory Visit: Payer: Medicaid Other

## 2014-06-29 VITALS — BP 98/73 | HR 107 | Temp 98.9°F | Resp 24

## 2014-06-29 DIAGNOSIS — C50912 Malignant neoplasm of unspecified site of left female breast: Secondary | ICD-10-CM

## 2014-06-29 DIAGNOSIS — R918 Other nonspecific abnormal finding of lung field: Secondary | ICD-10-CM

## 2014-06-29 MED ORDER — OXYCODONE HCL 5 MG PO TABS: 5.0000 mg | ORAL_TABLET | ORAL | Status: DC | PRN

## 2014-06-29 MED ORDER — SUCRALFATE 1 GM/10ML PO SUSP: 1.0000 g | Freq: Three times a day (TID) | ORAL | Status: DC

## 2014-06-29 NOTE — Addendum Note (Signed)
Encounter addended by: Jacqulyn Liner, RN on: 06/29/2014  5:20 PM<BR>     Documentation filed: Inpatient Document Flowsheet, Vitals Section

## 2014-06-29 NOTE — Telephone Encounter (Signed)
Patricia Carter called and said she is having severe pain with swallowing.  She reports that the food is burning all the way down in her chest.  She had tried aleve which does not help with the pain.  She has not tried oxycodone because it makes her sleepy.  Called Dr. Sondra Come and he would like her to take the oxycodone.  Called Amy back and advised her to take the oxycodone.  She said she would take one now and does have someone to drive her to her appointment today.

## 2014-06-29 NOTE — Progress Notes (Signed)
  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 . . . . . . . The patient presents for routine under treatment assessment.                                   The patient developed significant odynophagia over the weekend. She has a consequence has cut back significantly on her liquid and food intake over the weekend. Patient did use one of her oxycodone earlier today which helped somewhat. She denies any dizziness with standing. Vital signs shows some orthostatic blood pressure and pulse changes.                                 Set-up films were reviewed.                                 The chart was checked. Physical Findings. . . The patient has lost approximately 4-5 pounds over the past week Impression . . . . . . . The patient is experiencing significant odynophagia limiting her by mouth intake. Plan . . . . . . . . . . . . The patient will be set up for IV fluid supplementation in the morning. She is scheduled for medical oncology consult tomorrow but will need to postpone this appointment in light of her symptoms. This will be scheduled approximately a week later depending on Dr. Ernestina Penna schedule.  I did refill the patient's oxycodone she also has been given a prescription for Carafate suspension.  ________________________________   Blair Promise, PhD, MD

## 2014-06-29 NOTE — Progress Notes (Signed)
Patricia Carter reports severe pain with swallowing, even her own saliva that started on Friday.  She did try taking oxycodone at 11:30 with a little relief.  She reports she is trying to drink fluids.  She has not been able to eat anything.  She has lost 4-5 lbs since Tuesday.  Orthostatic vitals done: sitting bp 108/76, hr 97, standing bp 98/73, hr 107.  She reports her breathing is better.  She reports she has a cough with clear sputum but says it "is a whole lot less."  She reports fatigue.  The skin on her chest is intact.  The skin on her upper back is pink.  She is using radiaplex.  She has been given a one month follow up appointment card.

## 2014-06-30 ENCOUNTER — Ambulatory Visit: Payer: Self-pay

## 2014-06-30 ENCOUNTER — Ambulatory Visit: Payer: Self-pay | Admitting: Hematology

## 2014-06-30 ENCOUNTER — Telehealth: Payer: Self-pay | Admitting: Hematology

## 2014-06-30 ENCOUNTER — Telehealth: Payer: Self-pay | Admitting: *Deleted

## 2014-06-30 ENCOUNTER — Ambulatory Visit: Payer: Medicaid Other

## 2014-06-30 ENCOUNTER — Ambulatory Visit (HOSPITAL_BASED_OUTPATIENT_CLINIC_OR_DEPARTMENT_OTHER): Payer: Medicaid Other

## 2014-06-30 ENCOUNTER — Ambulatory Visit
Admission: RE | Admit: 2014-06-30 | Discharge: 2014-06-30 | Disposition: A | Discharge status: Home / Self Care | Payer: Medicaid Other | Source: Ambulatory Visit | Attending: Radiation Oncology | Admitting: Radiation Oncology

## 2014-06-30 ENCOUNTER — Other Ambulatory Visit: Payer: Self-pay | Admitting: Oncology

## 2014-06-30 DIAGNOSIS — R918 Other nonspecific abnormal finding of lung field: Secondary | ICD-10-CM

## 2014-06-30 DIAGNOSIS — C50912 Malignant neoplasm of unspecified site of left female breast: Secondary | ICD-10-CM | POA: Diagnosis not present

## 2014-06-30 LAB — CBC WITH DIFFERENTIAL/PLATELET
BASO%: 0.1 % (ref 0.0–2.0)
Basophils Absolute: 0 10*3/uL (ref 0.0–0.1)
EOS ABS: 0 10*3/uL (ref 0.0–0.5)
EOS%: 0.2 % (ref 0.0–7.0)
HEMATOCRIT: 46 % (ref 34.8–46.6)
HGB: 15 g/dL (ref 11.6–15.9)
LYMPH%: 3.3 % — ABNORMAL LOW (ref 14.0–49.7)
MCH: 27.9 pg (ref 25.1–34.0)
MCHC: 32.5 g/dL (ref 31.5–36.0)
MCV: 85.8 fL (ref 79.5–101.0)
MONO#: 1 10*3/uL — AB (ref 0.1–0.9)
MONO%: 10.9 % (ref 0.0–14.0)
NEUT#: 8.1 10*3/uL — ABNORMAL HIGH (ref 1.5–6.5)
NEUT%: 85.5 % — AB (ref 38.4–76.8)
Platelets: 213 10*3/uL (ref 145–400)
RBC: 5.37 10*6/uL (ref 3.70–5.45)
RDW: 13.2 % (ref 11.2–14.5)
WBC: 9.5 10*3/uL (ref 3.9–10.3)
lymph#: 0.3 10*3/uL — ABNORMAL LOW (ref 0.9–3.3)

## 2014-06-30 LAB — BASIC METABOLIC PANEL (CC13)
Anion Gap: 19 mEq/L — ABNORMAL HIGH (ref 3–11)
BUN: 18.9 mg/dL (ref 7.0–26.0)
CHLORIDE: 101 meq/L (ref 98–109)
CO2: 21 mEq/L — ABNORMAL LOW (ref 22–29)
Calcium: 10.2 mg/dL (ref 8.4–10.4)
Creatinine: 0.9 mg/dL (ref 0.6–1.1)
EGFR: 71 mL/min/{1.73_m2} — AB (ref >90)
Glucose: 70 mg/dl (ref 70–140)
POTASSIUM: 4.3 meq/L (ref 3.5–5.1)
Sodium: 141 mEq/L (ref 136–145)

## 2014-06-30 MED ORDER — SODIUM CHLORIDE 0.9 % IV SOLN: INTRAVENOUS | Status: AC

## 2014-06-30 MED ORDER — ONDANSETRON 8 MG/50ML IVPB (CHCC)
8.0000 mg | Freq: Once | INTRAVENOUS | Status: AC
  Administered 2014-06-30: 8 mg via INTRAVENOUS

## 2014-06-30 NOTE — Telephone Encounter (Signed)
Per 2/29 pof pt needs to r/s  For a later date,apt had already been cx by Vilinda Blanks and states that pt will call back to r/s  anne

## 2014-06-30 NOTE — Telephone Encounter (Signed)
Received a message from Santiago Glad (Dr. Clabe Seal nurse) requesting to cancel Dr. Ernestina Penna appt for today - 3/1 per pts request and she wants to reschedule. Called pt and left a message stating that I will go ahead and cancel appt as requested and for her to call me back to reschedule.

## 2014-06-30 NOTE — Patient Instructions (Signed)

## 2014-07-02 ENCOUNTER — Telehealth: Payer: Self-pay | Admitting: Oncology

## 2014-07-02 NOTE — Telephone Encounter (Signed)
Called and left a message to see how Patricia Carter is feeling and to see if she would like to make a follow up appointment for next week.  Requested a return call.

## 2014-07-02 NOTE — Telephone Encounter (Signed)
Patricia Carter called back and said she feels about the same with a lot of pain with swallowing.  She is taking Advil during the day and xanax at bedtime.  She said oxycodone is not helping the pain.  She was not able to fill the Carafate prescription because it was $106.00.  She said she does not have insurance and is waiting for medicaid.  She would like to come for follow up next week.  Advised her that we will call her back with an appointment time and also contact the financial advocate regarding helping her with medication costs.  Patricia Carter verbalized agreement.

## 2014-07-03 ENCOUNTER — Emergency Department (HOSPITAL_COMMUNITY): Payer: Medicaid Other

## 2014-07-03 ENCOUNTER — Encounter (HOSPITAL_COMMUNITY): Payer: Self-pay

## 2014-07-03 ENCOUNTER — Emergency Department (HOSPITAL_COMMUNITY)
Admission: EM | Admit: 2014-07-03 | Discharge: 2014-07-03 | Disposition: A | Discharge status: Home / Self Care | Payer: Medicaid Other | Attending: Emergency Medicine | Admitting: Emergency Medicine

## 2014-07-03 DIAGNOSIS — Z853 Personal history of malignant neoplasm of breast: Secondary | ICD-10-CM | POA: Diagnosis not present

## 2014-07-03 DIAGNOSIS — R1013 Epigastric pain: Secondary | ICD-10-CM | POA: Diagnosis present

## 2014-07-03 DIAGNOSIS — K209 Esophagitis, unspecified without bleeding: Secondary | ICD-10-CM

## 2014-07-03 DIAGNOSIS — E860 Dehydration: Secondary | ICD-10-CM | POA: Insufficient documentation

## 2014-07-03 DIAGNOSIS — R630 Anorexia: Secondary | ICD-10-CM | POA: Insufficient documentation

## 2014-07-03 DIAGNOSIS — Z79899 Other long term (current) drug therapy: Secondary | ICD-10-CM | POA: Insufficient documentation

## 2014-07-03 DIAGNOSIS — Z791 Long term (current) use of non-steroidal anti-inflammatories (NSAID): Secondary | ICD-10-CM | POA: Diagnosis not present

## 2014-07-03 LAB — CBC WITH DIFFERENTIAL/PLATELET
BASOS PCT: 0 % (ref 0–1)
Basophils Absolute: 0 10*3/uL (ref 0.0–0.1)
Eosinophils Absolute: 0 10*3/uL (ref 0.0–0.7)
Eosinophils Relative: 1 % (ref 0–5)
HEMATOCRIT: 45.9 % (ref 36.0–46.0)
Hemoglobin: 16 g/dL — ABNORMAL HIGH (ref 12.0–15.0)
LYMPHS PCT: 10 % — AB (ref 12–46)
Lymphs Abs: 0.6 10*3/uL — ABNORMAL LOW (ref 0.7–4.0)
MCH: 29.5 pg (ref 26.0–34.0)
MCHC: 34.9 g/dL (ref 30.0–36.0)
MCV: 84.7 fL (ref 78.0–100.0)
Monocytes Absolute: 0.4 10*3/uL (ref 0.1–1.0)
Monocytes Relative: 6 % (ref 3–12)
NEUTROS ABS: 5.5 10*3/uL (ref 1.7–7.7)
Neutrophils Relative %: 83 % — ABNORMAL HIGH (ref 43–77)
Platelets: 175 10*3/uL (ref 150–400)
RBC: 5.42 MIL/uL — ABNORMAL HIGH (ref 3.87–5.11)
RDW: 13 % (ref 11.5–15.5)
WBC: 6.6 10*3/uL (ref 4.0–10.5)

## 2014-07-03 LAB — COMPREHENSIVE METABOLIC PANEL
ALBUMIN: 4.5 g/dL (ref 3.5–5.2)
ALK PHOS: 80 U/L (ref 39–117)
ALT: 14 U/L (ref 0–35)
AST: 21 U/L (ref 0–37)
Anion gap: 17 — ABNORMAL HIGH (ref 5–15)
BUN: 26 mg/dL — ABNORMAL HIGH (ref 6–23)
CO2: 19 mmol/L (ref 19–32)
Calcium: 9.5 mg/dL (ref 8.4–10.5)
Chloride: 100 mmol/L (ref 96–112)
Creatinine, Ser: 0.95 mg/dL (ref 0.50–1.10)
GFR calc Af Amer: 74 mL/min — ABNORMAL LOW (ref >90)
GFR calc non Af Amer: 64 mL/min — ABNORMAL LOW (ref >90)
GLUCOSE: 77 mg/dL (ref 70–99)
POTASSIUM: 4.2 mmol/L (ref 3.5–5.1)
SODIUM: 136 mmol/L (ref 135–145)
TOTAL PROTEIN: 7.5 g/dL (ref 6.0–8.3)
Total Bilirubin: 1 mg/dL (ref 0.3–1.2)

## 2014-07-03 LAB — URINALYSIS, ROUTINE W REFLEX MICROSCOPIC
Bilirubin Urine: NEGATIVE
Glucose, UA: NEGATIVE mg/dL
LEUKOCYTES UA: NEGATIVE
Nitrite: NEGATIVE
Protein, ur: 30 mg/dL — AB
Specific Gravity, Urine: >1.046 — ABNORMAL HIGH (ref 1.005–1.030)
UROBILINOGEN UA: 0.2 mg/dL (ref 0.0–1.0)
pH: 5 (ref 5.0–8.0)

## 2014-07-03 LAB — URINE MICROSCOPIC-ADD ON

## 2014-07-03 LAB — LACTIC ACID, PLASMA
LACTIC ACID, VENOUS: 1.8 mmol/L (ref 0.5–2.0)
LACTIC ACID, VENOUS: 1.6 mmol/L (ref 0.5–2.0)

## 2014-07-03 LAB — LIPASE, BLOOD: Lipase: 19 U/L (ref 11–59)

## 2014-07-03 MED ORDER — GI COCKTAIL ~~LOC~~
30.0000 mL | Freq: Once | ORAL | Status: AC
  Administered 2014-07-03: 30 mL via ORAL
  Filled 2014-07-03: qty 30

## 2014-07-03 MED ORDER — IOHEXOL 300 MG/ML  SOLN
80.0000 mL | Freq: Once | INTRAMUSCULAR | Status: AC | PRN
  Administered 2014-07-03: 80 mL via INTRAVENOUS

## 2014-07-03 MED ORDER — SODIUM CHLORIDE 0.9 % IV BOLUS (SEPSIS)
1000.0000 mL | Freq: Once | INTRAVENOUS | Status: AC
  Administered 2014-07-03: 1000 mL via INTRAVENOUS

## 2014-07-03 MED ORDER — GI COCKTAIL ~~LOC~~: 30.0000 mL | Freq: Two times a day (BID) | ORAL | Status: DC | PRN

## 2014-07-03 MED ORDER — HYDROMORPHONE HCL 1 MG/ML IJ SOLN
1.0000 mg | Freq: Once | INTRAMUSCULAR | Status: AC
  Administered 2014-07-03: 1 mg via INTRAVENOUS
  Filled 2014-07-03: qty 1

## 2014-07-03 NOTE — ED Notes (Signed)
Patient c/o upper abdominal pain x 4 days. Patient has been getting radiation treatments and the last being 4 days ago. Patient states she is unable to keep fluids down and uanble to take po meds as prescribed. Patient states she was also prescribed a liquid medicine to help numb her esophagus, but was too expensive to buy.

## 2014-07-03 NOTE — Discharge Instructions (Signed)
Esophagitis Esophagitis is inflammation of the esophagus. It can involve swelling, soreness, and pain in the esophagus. This condition can make it difficult and painful to swallow. CAUSES  Most causes of esophagitis are not serious. Many different factors can cause esophagitis, including:  Gastroesophageal reflux disease (GERD). This is when acid from your stomach flows up into the esophagus.  Recurrent vomiting.  An allergic-type reaction.  Certain medicines, especially those that come in large pills.  Ingestion of harmful chemicals, such as household cleaning products.  Heavy alcohol use.  An infection of the esophagus.  Radiation treatment for cancer.  Certain diseases such as sarcoidosis, Crohn's disease, and scleroderma. These diseases may cause recurrent esophagitis. SYMPTOMS   Trouble swallowing.  Painful swallowing.  Chest pain.  Difficulty breathing.  Nausea.  Vomiting.  Abdominal pain. DIAGNOSIS  Your caregiver will take your history and do a physical exam. Depending upon what your caregiver finds, certain tests may also be done, including:  Barium X-ray. You will drink a solution that coats the esophagus, and X-rays will be taken.  Endoscopy. A lighted tube is put down the esophagus so your caregiver can examine the area.  Allergy tests. These can sometimes be arranged through follow-up visits. TREATMENT  Treatment will depend on the cause of your esophagitis. In some cases, steroids or other medicines may be given to help relieve your symptoms or to treat the underlying cause of your condition. Medicines that may be recommended include:  Viscous lidocaine, to soothe the esophagus.  Antacids.  Acid reducers.  Proton pump inhibitors.  Antiviral medicines for certain viral infections of the esophagus.  Antifungal medicines for certain fungal infections of the esophagus.  Antibiotic medicines, depending on the cause of the esophagitis. HOME CARE  INSTRUCTIONS   Avoid foods and drinks that seem to make your symptoms worse.  Eat small, frequent meals instead of large meals.  Avoid eating for the 3 hours prior to your bedtime.  If you have trouble taking pills, use a pill splitter to decrease the size and likelihood of the pill getting stuck or injuring the esophagus on the way down. Drinking water after taking a pill also helps.  Stop smoking if you smoke.  Maintain a healthy weight.  Wear loose-fitting clothing. Do not wear anything tight around your waist that causes pressure on your stomach.  Raise the head of your bed 6 to 8 inches with wood blocks to help you sleep. Extra pillows will not help.  Only take over-the-counter or prescription medicines as directed by your caregiver. SEEK IMMEDIATE MEDICAL CARE IF:  You have severe chest pain that radiates into your arm, neck, or jaw.  You feel sweaty, dizzy, or lightheaded.  You have shortness of breath.  You vomit blood.  You have difficulty or pain with swallowing.  You have bloody or black, tarry stools.  You have a fever.  You have a burning sensation in the chest more than 3 times a week for more than 2 weeks.  You cannot swallow, drink, or eat.  You drool because you cannot swallow your saliva. MAKE SURE YOU:  Understand these instructions.  Will watch your condition.  Will get help right away if you are not doing well or get worse. Document Released: 05/25/2004 Document Revised: 07/10/2011 Document Reviewed: 12/16/2010 Exit[employer] Patient Information 2015 [employer]. This information is not intended to replace advice given to you by your health care provider. Make sure you discuss any questions you have with your health care provider.  

## 2014-07-03 NOTE — ED Provider Notes (Signed)
CSN: [id number]     Arrival date & time 07/03/14  5573 History   First MD Initiated Contact with Patient 07/03/14 [phone]     Chief Complaint  Patient presents with  . Abdominal Pain     (Consider location/radiation/quality/duration/timing/severity/associated sxs/prior Treatment) Patient is a 59 y.o. female presenting with abdominal pain.  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 Breast cancer   . Breast cancer    Past Surgical History  Procedure Laterality Date  . Mastectomy Left     2010  . Eye surgery      cataract surgery, wears contact left eye  . Subxyphoid pericardial window N/A 05/05/2014    Procedure: SUBXYPHOID PERICARDIAL WINDOW;  Surgeon: Gaye Pollack, MD;  Location: [facility] OR;  Service: Thoracic;  Laterality: N/A;  . Flexible bronchoscopy N/A 05/05/2014    Procedure: FLEXIBLE BRONCHOSCOPY;  Surgeon: Gaye Pollack, MD;  Location: [facility] OR;  Service: Thoracic;  Laterality: N/A;  . Video bronchoscopy with endobronchial ultrasound N/A 05/05/2014    Procedure: VIDEO BRONCHOSCOPY WITH ENDOBRONCHIAL ULTRASOUND;  Surgeon: Gaye Pollack, MD;  Location: [facility] OR;  Service: Thoracic;  Laterality: N/A;   Family History  Problem Relation Age of Onset  . Cancer Paternal Aunt     leukemia   History  Substance Use Topics  . Smoking status: Never Smoker   . Smokeless tobacco: Never Used  . Alcohol Use: 0.0 oz/week    0 Standard drinks or equivalent per week     Comment: occ   OB History    No data available      Review of Systems  Constitutional: Negative for fever.  Gastrointestinal: Positive for vomiting, abdominal pain, constipation and anorexia. Negative for diarrhea.  All other systems reviewed and are negative.     Allergies  Prednisone and Zyrtec  Home Medications   Prior to Admission medications   Medication Sig Start Date End Date Taking? Authorizing Provider  ALPRAZolam Duanne Moron) 0.5 MG tablet Take 1 tablet (0.5 mg total) by mouth 3 (three) times daily. For anxiety Patient taking differently: Take 0.5 mg by mouth at bedtime as needed for anxiety or sleep. For anxiety 06/10/14  Yes Blair Promise, MD  hyaluronate sodium (RADIAPLEXRX) GEL Apply 1 application topically once.   Yes Historical Provider, MD  Naproxen Sodium (ALEVE PO) Take 1 tablet by mouth 2 (two) times daily as needed (headache).    Yes Historical Provider, MD  oxyCODONE (OXY IR/ROXICODONE) 5 MG immediate release tablet Take 1-2 tablets (5-10 mg total) by mouth every 4 (four) hours as needed for severe pain. 06/29/14  Yes Blair Promise, MD  Alum & Mag Hydroxide-Simeth (GI COCKTAIL) SUSP suspension Take 30 mLs by mouth 2 (two) times daily as needed for indigestion. Shake well. 07/03/14   Debby Freiberg, MD  sucralfate (CARAFATE) 1 GM/10ML suspension Take 10 mLs (1 g total) by mouth 4 (four) times daily -  with meals and at bedtime. Patient not taking: Reported on 07/03/2014 06/29/14   Blair Promise, MD  BP 121/77 mmHg  Pulse 91  Temp(Src) 97.5 F (36.4 C) (Oral)  Resp 16  SpO2 100% Physical Exam  Constitutional: She is oriented to person, place, and time. She appears well-developed and well-nourished.  HENT:  Head: Normocephalic and atraumatic.  Right Ear: External ear normal.  Left Ear: External ear normal.  Eyes: Conjunctivae and EOM are normal. Pupils are equal, round, and reactive to light.  Neck: Normal range of motion. Neck supple.  Cardiovascular: Normal rate, regular rhythm, normal heart sounds and intact  distal pulses.   Pulmonary/Chest: Effort normal and breath sounds normal.  Abdominal: Soft. Bowel sounds are normal. There is no tenderness.  Musculoskeletal: Normal range of motion.  Neurological: She is alert and oriented to person, place, and time.  Skin: Skin is warm and dry.  Vitals reviewed.   ED Course  Procedures (including critical care time) Labs Review Labs Reviewed  COMPREHENSIVE METABOLIC PANEL - Abnormal; Notable for the following:    BUN 26 (*)    GFR calc non Af Amer 64 (*)    GFR calc Af Amer 74 (*)    Anion gap 17 (*)    All other components within normal limits  CBC WITH DIFFERENTIAL/PLATELET - Abnormal; Notable for the following:    RBC 5.42 (*)    Hemoglobin 16.0 (*)    Neutrophils Relative % 83 (*)    Lymphocytes Relative 10 (*)    Lymphs Abs 0.6 (*)    All other components within normal limits  URINALYSIS, ROUTINE W REFLEX MICROSCOPIC - Abnormal; Notable for the following:    Specific Gravity, Urine >1.046 (*)    Hgb urine dipstick TRACE (*)    Ketones, ur >80 (*)    Protein, ur 30 (*)    All other components within normal limits  URINE MICROSCOPIC-ADD ON - Abnormal; Notable for the following:    Casts GRANULAR CAST (*)    All other components within normal limits  LIPASE, BLOOD  LACTIC ACID, PLASMA  LACTIC ACID, PLASMA    Imaging Review Dg Chest 2 View  07/03/2014   CLINICAL DATA:  Chest pain.  History breast carcinoma  EXAM: CHEST  2 VIEW  COMPARISON:  June 03, 2014  FINDINGS: Patient is status post left mastectomy. There is persistent consolidation with volume loss in the superior aspect of the right middle lobe. Lungs elsewhere clear. Heart size and pulmonary vascularity are within normal limits. No adenopathy. No bone lesions.  IMPRESSION: Persistent consolidation in the superior aspect of the right middle lobe. The persistence of this finding, CT or bronchoscopy may be advised to assess for possible obstructing lesion in a segment of the right  middle lobe bronchus. Lungs elsewhere clear. Status post left mastectomy.   Electronically Signed   By: Lowella Grip III M.D.   On: 07/03/2014 09:29   Dg Abd 1 View  07/03/2014   CLINICAL DATA:  Pain with nausea and vomiting. History of breast carcinoma  EXAM: ABDOMEN - 1 VIEW  COMPARISON:  None.  FINDINGS: There is no bowel dilatation or air-fluid level suggesting obstruction. No free air. There is moderate stool in the colon. There is a phlebolith in mid right pelvis. No appreciable bone lesions.  IMPRESSION: Bowel gas pattern unremarkable.   Electronically Signed   By: Lowella Grip III M.D.   On: 07/03/2014 09:27   Ct Chest W Contrast  07/03/2014   CLINICAL DATA:  Epigastric and upper abdominal pain for 4 days, lung cancer with ongoing radiation  an last treatment 4 days ago, unable to keep fluids down  EXAM: CT CHEST WITH CONTRAST  TECHNIQUE: Multidetector CT imaging of the chest was performed during intravenous contrast administration. Sagittal and coronal MPR images reconstructed from axial data set.  CONTRAST:  30mL OMNIPAQUE IOHEXOL 300 MG/ML  SOLN IV  COMPARISON:  04/30/2014  FINDINGS: Vascular structures grossly patent on nondedicated exam.  Probable mild fatty infiltration of liver focally adjacent to falciform fissure.  Remaining visualized portions of upper abdomen unremarkable.  BILATERAL breast prostheses.  Abnormal soft tissue at RIGHT hilum extending in the mediastinum again identified.  RIGHT hilar component has decreased to 1.8 x 1.5 cm from the 2.9 x 2.3 cm on previous exam.  The subcarinal component is much smaller now 3.0 x 0.9 cm, previously 3.6 x 1.8 cm.  No new thoracic adenopathy.  Again identified mass opacity and volume loss in the medial RIGHT upper lobe, slightly decreased in size 2.7 x 2.4 cm previously 3.6 x 3.6 cm.  Thickening at the RIGHT major fissure unchanged.  Peripheral scarring in RIGHT upper lobe and questionable areas of RIGHT upper lobe infiltrate or  identified.  Nodule at medial RIGHT upper lobe 5 x 4 mm previously 3 x 2 mm.  RIGHT mid lower lobe nodule 4 mm diameter image 32 previously 8 mm.  Few additional tiny RIGHT lung nodular densities appear unchanged.  No acute infiltrate, pleural effusion or pneumothorax.  No acute osseous findings.  IMPRESSION: Interval decrease in sizes of RIGHT upper lobe mass, RIGHT hilar adenopathy and mediastinal adenopathy.  RIGHT lower lobe pulmonary nodule decreased in size with a single RIGHT apex nodule minimally increased in size since previous study.   Electronically Signed   By: Lavonia Dana M.D.   On: 07/03/2014 10:48     EKG Interpretation None      MDM   Final diagnoses:  Epigastric abdominal pain  Esophagitis    59 y.o. female with pertinent PMH of metastatic breast ca with ongoing radiation presents with acute on chronic epigastric abd pain and chest pain, worse with swallowing.  She has been worked up for same symptoms for her oncologist with presumptive diagnosis of radiation esophagitis.  She could not afford prescribed gi cocktail which alleviated her symptoms.  On arrival today vitals and physical exam as above.    WU with negative CT pe study.  Mildly dehydrated on exam, however symptoms completely alleviated with GI cocktail and NS bolus.  Likely continued esophageal pain from radiation.  Encouraged close fu with oncology.  DC home with standard return precautions.  I have reviewed all laboratory and imaging studies if ordered as above  1. Esophagitis   2. Epigastric abdominal pain         Debby Freiberg, MD 07/04/14 [phone]

## 2014-07-03 NOTE — ED Notes (Signed)
Family at bedside wanting results. EDP notified and will present to the bedside.

## 2014-07-03 NOTE — Progress Notes (Signed)
  CARE MANAGEMENT ED NOTE 07/03/2014  Patient:  Patricia Carter, Patricia Carter   Account Number:  [id number]  Date Initiated:  07/03/2014  Documentation initiated by:  Jackelyn Poling  Subjective/Objective Assessment:   59 yr old medicaid of [place] c/o upper abdominal pain x 4 days. Patient has been getting radiation treatments and the last being 4 days ago. Patient states she is unable to keep fluids down and uanble to take po meds as prescribed. Pt     Subjective/Objective Assessment Detail:   states she is unable to keep fluids down and uanble to take po meds as prescribed. Patient states she was also prescribed a liquid medicine to help numb her esophagus, but was too expensive to buy.    NO pcp listed in EPIC pt does not have medicaid of [place] card and no pcp recalled        Action/Plan:   consulted by ED RN about assisting pt with medicaid ID number from [facility] She has not received her medicaid card at this time and will need info to get Rx filled Provided also with lists of medicaid MDs for [facility]   Action/Plan Detail:   counties with DSS contact numbers for each   Anticipated DC Date:  07/03/2014     Status Recommendation to Physician:   Result of Recommendation:    Other ED Services  Consult Working [place]  Other  Outpatient Services - Pt will follow up  PCP issues    Choice offered to / List presented to:            Status of service:  Completed, signed off  ED Comments:   ED Comments Detail:

## 2014-07-03 NOTE — ED Notes (Signed)
Pt. Is unable to use the restroom at this time, but is aware that we need a urine specimen.  

## 2014-07-06 ENCOUNTER — Telehealth: Payer: Self-pay | Admitting: Oncology

## 2014-07-06 NOTE — Telephone Encounter (Signed)
Left a message for Patricia Carter to see how she is feeling as well as letting her know that she can stop by medical oncology and meet with [name], Patient Financial Advocate, regarding insurance/medication coverage.

## 2014-07-06 NOTE — Telephone Encounter (Signed)
Patricia Carter called back and said she is feeling a tiny bit better.  She is taking xanax and aleve which helps relieve the pain.  She did go to the ER on Friday because she said the pain was unbearable.  She said her Medicaid has gone through now.  She is interested in meeting with [name], Patient Financial Advocate at 3 pm on Wednesday.  [name] has been called about scheduling the appointment.

## 2014-07-08 ENCOUNTER — Ambulatory Visit: Payer: MEDICAID | Admitting: Radiation Oncology

## 2014-07-08 ENCOUNTER — Telehealth: Payer: Self-pay | Admitting: Oncology

## 2014-07-08 NOTE — Telephone Encounter (Signed)
Called Patricia Carter to see how she is feeling.  She did cancel her follow up with Dr. Sondra Come today.  [name] said she is feeling a tiny bit better.  She was able to drink a cup of milk and some boost today.  She is taking aleve twice a day, 3 xanax a day and magic mouthwash as needed.  Encouraged her to call with any questions or concerns.  [name] verbalized agreement.

## 2014-07-14 ENCOUNTER — Ambulatory Visit (HOSPITAL_BASED_OUTPATIENT_CLINIC_OR_DEPARTMENT_OTHER): Payer: Medicaid Other | Admitting: Hematology

## 2014-07-14 ENCOUNTER — Other Ambulatory Visit (HOSPITAL_BASED_OUTPATIENT_CLINIC_OR_DEPARTMENT_OTHER): Payer: Medicaid Other

## 2014-07-14 ENCOUNTER — Telehealth: Payer: Self-pay | Admitting: Hematology

## 2014-07-14 ENCOUNTER — Encounter: Payer: Self-pay | Admitting: Hematology

## 2014-07-14 VITALS — BP 93/59 | HR 94 | Temp 98.7°F | Resp 20 | Ht 63.0 in | Wt 96.9 lb

## 2014-07-14 DIAGNOSIS — C7801 Secondary malignant neoplasm of right lung: Secondary | ICD-10-CM

## 2014-07-14 DIAGNOSIS — R918 Other nonspecific abnormal finding of lung field: Secondary | ICD-10-CM

## 2014-07-14 DIAGNOSIS — C50912 Malignant neoplasm of unspecified site of left female breast: Secondary | ICD-10-CM | POA: Diagnosis present

## 2014-07-14 DIAGNOSIS — C773 Secondary and unspecified malignant neoplasm of axilla and upper limb lymph nodes: Secondary | ICD-10-CM

## 2014-07-14 DIAGNOSIS — Z170 Estrogen receptor positive status [ER+]: Secondary | ICD-10-CM | POA: Diagnosis not present

## 2014-07-14 LAB — CBC WITH DIFFERENTIAL/PLATELET
BASO%: 0.2 % (ref 0.0–2.0)
BASOS ABS: 0 10*3/uL (ref 0.0–0.1)
EOS ABS: 0 10*3/uL (ref 0.0–0.5)
EOS%: 0.6 % (ref 0.0–7.0)
HEMATOCRIT: 41.1 % (ref 34.8–46.6)
HEMOGLOBIN: 14.3 g/dL (ref 11.6–15.9)
LYMPH%: 5.8 % — ABNORMAL LOW (ref 14.0–49.7)
MCH: 29.2 pg (ref 25.1–34.0)
MCHC: 34.8 g/dL (ref 31.5–36.0)
MCV: 83.9 fL (ref 79.5–101.0)
MONO#: 0.6 10*3/uL (ref 0.1–0.9)
MONO%: 11.5 % (ref 0.0–14.0)
NEUT%: 81.9 % — AB (ref 38.4–76.8)
NEUTROS ABS: 4.4 10*3/uL (ref 1.5–6.5)
PLATELETS: 188 10*3/uL (ref 145–400)
RBC: 4.9 10*6/uL (ref 3.70–5.45)
RDW: 13.4 % (ref 11.2–14.5)
WBC: 5.3 10*3/uL (ref 3.9–10.3)
lymph#: 0.3 10*3/uL — ABNORMAL LOW (ref 0.9–3.3)

## 2014-07-14 LAB — COMPREHENSIVE METABOLIC PANEL (CC13)
ALT: 13 U/L (ref 0–55)
AST: 19 U/L (ref 5–34)
Albumin: 4 g/dL (ref 3.5–5.0)
Alkaline Phosphatase: 72 U/L (ref 40–150)
Anion Gap: 11 mEq/L (ref 3–11)
BUN: 27.4 mg/dL — ABNORMAL HIGH (ref 7.0–26.0)
CALCIUM: 9.7 mg/dL (ref 8.4–10.4)
CO2: 30 mEq/L — ABNORMAL HIGH (ref 22–29)
Chloride: 99 mEq/L (ref 98–109)
Creatinine: 0.9 mg/dL (ref 0.6–1.1)
EGFR: 69 mL/min/{1.73_m2} — ABNORMAL LOW (ref >90)
Glucose: 93 mg/dl (ref 70–140)
POTASSIUM: 4.4 meq/L (ref 3.5–5.1)
Sodium: 140 mEq/L (ref 136–145)
TOTAL PROTEIN: 6.7 g/dL (ref 6.4–8.3)
Total Bilirubin: 0.41 mg/dL (ref 0.20–1.20)

## 2014-07-14 MED ORDER — ALPRAZOLAM 0.5 MG PO TABS: 0.5000 mg | ORAL_TABLET | Freq: Three times a day (TID) | ORAL | Status: DC

## 2014-07-14 NOTE — Telephone Encounter (Signed)
Pt confirmed MD visit per 03/15 POF, gave pt AVS and calendar..... KJ

## 2014-07-14 NOTE — Progress Notes (Signed)
[facility]  Telephone:(336) [phone] Fax:(336) [phone]  Clinic New Consult Note   Patient Care Team: No Pcp Per Patient as PCP - General (General Practice) 07/14/2014  CHIEF COMPLAINTS/PURPOSE OF CONSULTATION:  Recurrent breast cancer    Recurrent cancer of left breast   12/23/2009 Surgery left mastectomy with axillary node dissection. Deep surgical margin were positive, pT2N1, tumor size 3.7 x 3.0 x 2.2 cm, grade 3, 2 out of 10 lymph nodes were positive for tumor cells. 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 CT on 05/01/2015 showed right hilar mass with compression of the SVC and right upper lobe pulmonary artery and obstruction of the right middle lobe bronchus. Moderate pericardial effusion   05/05/2014 Pathology Results Endobronchial biopsy of right lower lobe showed poorly differentiated adenocarcinoma, estrogen receptor positive, CK AE1/AE3 positive. Negative for CK 7, CK 20, TTF-1, S100,etc.  HER2(-). Pericardium biopsy negative.   06/10/2014 - 06/29/2014 Radiation Therapy [place] radiation to right hilar mass.     HISTORY OF PRESENTING ILLNESS:  Patricia Carter 59 y.o. female is here because of recently diagnosed metastatic recurrent breast cancer.  She was diagnosed with pT2 N1M0, stage IIB, left breast cancer in August 2011. She underwent left mastectomy and reconstruction with implant and flap on 12/09/2009 at [place] in [facility]. She met to medical oncologist after surgery, and declined adjuvant chemotherapy and endocrine therapy. She has not followed with physician regularly after that and does not have annual mammogram.  She started having dry cough and dyspnea on exertion in May 2016. She had multiple ED visit since then and was treated  with multiple courses of antibiotics for bronchitis, which really did not help. In December 2015, she called the bowel pulmonary clinic and was evaluated for worsening cough and dyspnea. A CT of chest was obtained which showed a right hilar mass which cause compression on SVC and right upper lobe pulmonary artery and obstruction of right middle lobe. Moderate pericardial effusion was noticed. She underwent bronchoscopy and pericardial window on 05/01/2014. Biopsy of the right endobronchial lesion showed poorly differentiated adenoma, consistent with breast primary. Pericardial window biopsy and cytology was negative for malignancy.  She started palliative radiation on 06/10/2014, and completed on 06/29/2014, a total of 14 sessions. She tolerated well initially, but developed severe substernal chest pain and dysphagia at the last week of radiation. She still has quite a bit chest pain, is taking Bumex and oxycodone, which helps only some. She is able to eat soft food diet and adequate. She lost about 7 pounds during the radiation. Her dyspnea and cough has significantly improved after radiation.  MEDICAL HISTORY:  Past Medical History  Diagnosis Date  . Breast cancer   . Breast cancer     SURGICAL HISTORY: Past Surgical History  Procedure Laterality Date  . Mastectomy Left     2010  . Eye surgery      cataract surgery, wears contact left eye  . Subxyphoid pericardial window N/A 05/05/2014    Procedure: SUBXYPHOID PERICARDIAL WINDOW;  Surgeon: Gaye Pollack, MD;  Location: [facility] OR;  Service: Thoracic;  Laterality: N/A;  . Flexible bronchoscopy N/A 05/05/2014    Procedure: FLEXIBLE BRONCHOSCOPY;  Surgeon: Gaye Pollack, MD;  Location: [facility];  Service: Thoracic;  Laterality: N/A;  . Video bronchoscopy with  endobronchial ultrasound N/A 05/05/2014    Procedure: VIDEO BRONCHOSCOPY WITH ENDOBRONCHIAL ULTRASOUND;  Surgeon: Gaye Pollack, MD;  Location: [facility] OR;  Service: Thoracic;  Laterality: N/A;     SOCIAL HISTORY: History   Social History  . Marital Status: Single    Spouse Name: N/A  . Number of Children: N/A  . Years of Education: N/A   Occupational History  . Works PT reception    Social History Main Topics  . Smoking status: Never Smoker   . Smokeless tobacco: Never Used  . Alcohol Use: 0.0 oz/week    0 Standard drinks or equivalent per week     Comment: occ  . Drug Use: No  . Sexual Activity: Not Currently   Other Topics Concern  . Not on file   Social History Narrative    FAMILY HISTORY: Family History  Problem Relation Age of Onset  . Cancer Paternal Aunt     leukemia    ALLERGIES:  is allergic to prednisone and zyrtec.  MEDICATIONS:  Current Outpatient Prescriptions  Medication Sig Dispense Refill  . ALPRAZolam (XANAX) 0.5 MG tablet Take 1 tablet (0.5 mg total) by mouth 3 (three) times daily. For anxiety (Patient taking differently: Take 0.5 mg by mouth at bedtime as needed for anxiety or sleep. For anxiety) 60 tablet 0  . Alum & Mag Hydroxide-Simeth (GI COCKTAIL) SUSP suspension Take 30 mLs by mouth 2 (two) times daily as needed for indigestion. Shake well. 120 mL 0  . hyaluronate sodium (RADIAPLEXRX) GEL Apply 1 application topically once.    . Naproxen Sodium (ALEVE PO) Take 1 tablet by mouth 2 (two) times daily as needed (headache).     . oxyCODONE (OXY IR/ROXICODONE) 5 MG immediate release tablet Take 1-2 tablets (5-10 mg total) by mouth every 4 (four) hours as needed for severe pain. 30 tablet 0  . sucralfate (CARAFATE) 1 GM/10ML suspension Take 10 mLs (1 g total) by mouth 4 (four) times daily -  with meals and at bedtime. (Patient not taking: Reported on 07/03/2014) 420 mL 0   No current facility-administered medications for this visit.    REVIEW OF SYSTEMS:   Constitutional: Denies fevers, chills or abnormal night sweats. (+) fatigue  Eyes: Denies blurriness of vision, double vision or watery eyes Ears, nose, mouth, throat, and face:  Denies mucositis or sore throat Respiratory:cough and dyspnea has significantly improved after radiation, no wheezes Cardiovascular: (+) mid chest pain, Denies palpitation, or lower extremity swelling Gastrointestinal:  Denies nausea, heartburn or change in bowel habits Skin: Denies abnormal skin rashes Lymphatics: Denies new lymphadenopathy or easy bruising Neurological:Denies numbness, tingling or new weaknesses Behavioral/Psych: Mood is stable, no new changes  All other systems were reviewed with the patient and are negative.  PHYSICAL EXAMINATION: ECOG PERFORMANCE STATUS: 2 - Symptomatic, <50% confined to bed  Filed Vitals:   07/14/14 1227  BP: 93/59  Pulse: 94  Temp: 98.7 F (37.1 C)  Resp: 20   Filed Weights   07/14/14 1227  Weight: 96 lb 14.4 oz (43.954 kg)    GENERAL:alert, no distress and comfortable SKIN: skin color, texture, turgor are normal, no rashes or significant lesions EYES: normal, conjunctiva are pink and non-injected, sclera clear OROPHARYNX:no exudate, no erythema and lips, buccal mucosa, and tongue normal  NECK: supple, thyroid normal size, non-tender, without nodularity LYMPH:  no palpable lymphadenopathy in the cervical, axillary or inguinal LUNGS: clear to auscultation and percussion with normal breathing effort HEART: regular rate & rhythm and no  murmurs and no lower extremity edema ABDOMEN:abdomen soft, non-tender and normal bowel sounds Musculoskeletal:no cyanosis of digits and no clubbing  PSYCH: alert & oriented x 3 with fluent speech NEURO: no focal motor/sensory deficits  LABORATORY DATA:  I have reviewed the data as listed Lab Results  Component Value Date   WBC 5.3 07/14/2014   HGB 14.3 07/14/2014   HCT 41.1 07/14/2014   MCV 83.9 07/14/2014   PLT 188 07/14/2014    Recent Labs  05/04/14 1725 05/07/14 0348 06/30/14 1307 07/03/14 0855 07/14/14 1206  NA 140 139 141 136 140  K 4.0 4.1 4.3 4.2 4.4  CL 105 106  --  100  --   CO2  28 29 21* 19 30*  GLUCOSE 124* 93 70 77 93  BUN 14 11 18.9 26* 27.4*  CREATININE 0.95 0.79 0.9 0.95 0.9  CALCIUM 9.3 8.8 10.2 9.5 9.7  GFRNONAA 64* 89*  --  64*  --   GFRAA 74* >90  --  74*  --   PROT 6.4  --   --  7.5 6.7  ALBUMIN 4.0  --   --  4.5 4.0  AST 23  --   --  21 19  ALT 10  --   --  14 13  ALKPHOS 103  --   --  80 72  BILITOT 0.3  --   --  1.0 0.41   PATHOLOGY REPORT: Diagnosis 05/05/2014 1. Pericardium, biopsy - BENIGN MESOTHELIAL LINED FIBROADIPOSE TISSUE. 2. Endobronchial biopsy, right middle lobe - POORLY DIFFERENTIATED CARCINOMA - SEE COMMENT. 3. Endobronchial biopsy, right lower lobe - POORLY DIFFERENTIATED CARCINOMA. - SEE COMMENT. 1 of 2 FINAL for KORTLYN, KOLTZ ([id number]) Microscopic Comment 2. , 3. The malignant cells are positive for cytokeratin AE1/AE3 and estrogen receptor. They are negative for cytokeratin 7, cytokeratin 20, S-100, GCDFP, CD45, chromogranin, cytokeratin 5/6, synaptophysin, TTF-1. This immunohistochemical profile is not specific. However, given the patient's history, the positive staining for estrogen receptor is most suggestive of metastatic primary breast carcinoma. A HER-2 will be attempted and the results reportedly separately. (JBK:ecj 05/11/2014)  RADIOGRAPHIC STUDIES: I have personally reviewed the radiological images as listed and agreed with the findings in the report.  CT chest 05/01/2015  IMPRESSION: 1. Negative for pulmonary embolus. 2. Right hilar mass with compression of the SVC and right upper lobe pulmonary artery and obstruction of the right middle lobe bronchus. Right middle lobe mass and postobstructive collapse. Findings are highly worrisome for primary bronchogenic carcinoma. 3. Possible satellite right lower lobe nodule. 4. Moderate pericardial effusion. 5. Scattered small nodular and ground-glass opacities in the right upper lobe, likely infectious or inflammatory in etiology.  CT chest w contrast  07/03/2014 IMPRESSION: Interval decrease in sizes of RIGHT upper lobe mass, RIGHT hilar adenopathy and mediastinal adenopathy.  RIGHT lower lobe pulmonary nodule decreased in size with a single RIGHT apex nodule minimally increased in size since previous study.   Electronically Signed   By: Lavonia Dana M.D.   On: 07/03/2014 10:48    ASSESSMENT & PLAN:  59 year old female with past medical history of stage IIB left breast cancer, status post mastectomy and axillary lymph node dissection in 2011. Now presented with metastatic disease to lungs and lymph nodes.  1. 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 She never smoked, and immunostain does not support primary  lung cancer. -She has had good response to palliative radiation with significant symptom improvement. -I will obtain a PET scan to ruled out other metastasis  -Her lung metastasis having treated with radiation. If she has no additional visceral metastasis, I would recommend a aromatase inhibitor and Ibrance as first-line therapy for her metastatic breast cancer. -If she has substantial other visceral metastasis, I would consider single agent chemotherapy as first-line. -She will need some time to recover from radiation, especially the esophagitis. -I'll plan to start her systemic therapy in 3 weeks  2. Coping and social support -She broke into tears a few times during the office visit. She appears to be very distressed by the diagnosis of metastatic cancer and side effects from radiation. -Refer her to social worker -Her daughter lives in [place], she has not determined if she will stay in [place] for treatment hormone to [place] to stay with her daughter.   Plan -PET scan before next visit -Return to clinic in 3 weeks just initiate systemic  therapy. -I'll send a prescription of letrozole and Ibrance to [facility].   All questions were answered. The patient knows to call the clinic with any problems, questions or concerns. I spent 55 minutes counseling the patient face to face. The total time spent in the appointment was 60 minutes and more than 50% was on counseling.     Truitt Merle, MD 07/14/2014 12:47 PM

## 2014-07-15 ENCOUNTER — Other Ambulatory Visit: Payer: Self-pay | Admitting: *Deleted

## 2014-07-15 ENCOUNTER — Telehealth: Payer: Self-pay | Admitting: *Deleted

## 2014-07-15 DIAGNOSIS — C50912 Malignant neoplasm of unspecified site of left female breast: Secondary | ICD-10-CM

## 2014-07-15 MED ORDER — GI COCKTAIL ~~LOC~~: 10.0000 mL | Freq: Two times a day (BID) | ORAL | Status: DC | PRN

## 2014-07-15 NOTE — Telephone Encounter (Signed)
Patient called to say she took Carafate as ordered yesterday and did not tolerate it.  She said she had terrible nausea and vomited.  That was the only med that she had taken.  She would like to have a refill of the GI cocktail that she had previously taken.  Even though the anesthetic agent did not last long, she tolerated it much better than the Carafate.  Will refill the previous prescription.

## 2014-07-19 ENCOUNTER — Encounter: Payer: Self-pay | Admitting: Radiation Oncology

## 2014-07-19 NOTE — Progress Notes (Signed)
  Radiation Oncology         [phone] ________________________________  Name: Patricia Carter MRN: [id number]  Date: 07/19/2014  DOB: 1955/06/06  End of Treatment Note    DIAGNOSIS: Recurrent left breast cancer with right central chest recurrence resulting in lung collapse and impending superior vena cava syndrome  Indication for treatment:  Breathing difficulties, local regional control and to avoid superior vena cava syndrome       Radiation treatment dates:   February 10 through February 29  Site/dose:   Right central chest 35 gray in 14 fractions  Beams/energy:   Intensity modulated radiation therapy, helical, 6 megavoltage photons  Narrative: The patient tolerated radiation treatment relatively well.   She did develop esophageal symptoms towards the end of her treatment. The patient was given narcotics for this issue as well as Carafate but she did not fill her Carafate due to financial issues. Overall the patient reported improvement in her breathing during the course of treatment.  Plan: The patient has completed radiation treatment. The patient will return to radiation oncology clinic for routine followup in one month. I advised them to call or return sooner if they have any questions or concerns related to their recovery or treatment.  -----------------------------------  Blair Promise, PhD, MD

## 2014-07-21 MED ORDER — PALBOCICLIB 125 MG PO CAPS
125.0000 mg | ORAL_CAPSULE | Freq: Every day | ORAL | Status: DC
Start: 2014-07-21 — End: 2014-09-14

## 2014-07-21 MED ORDER — LETROZOLE 2.5 MG PO TABS: 2.5000 mg | ORAL_TABLET | Freq: Every day | ORAL | Status: DC

## 2014-07-21 NOTE — Addendum Note (Signed)
Addended by: Truitt Merle on: 07/21/2014 04:55 PM   Modules accepted: Orders

## 2014-07-22 ENCOUNTER — Telehealth: Payer: Self-pay | Admitting: *Deleted

## 2014-07-22 NOTE — Telephone Encounter (Signed)
Received call from Aaron Edelman, pharmacist @ [facility] re:  Pt's copay for Ibrance and Letrozole will be  $ 3.00  Each.   Aaron Edelman called to inform pt; however, pt stated she was not sure if she would consider taking these meds yet.  Pt stated she would call office to let md know.  Will follow up on 07/23/14.

## 2014-07-22 NOTE — Telephone Encounter (Signed)
I have attempted to call also and asked her to call me back.  I'll transfer her to you when/if she calls back.  Thanks for letting me know.

## 2014-07-22 NOTE — Telephone Encounter (Signed)
Patient called to say she has not heard from anyone in Radiology about the PET that was ordered 07/14/14.  Called [employer] and spoke with [facility].  She will call the patient and schedule the study.

## 2014-07-29 ENCOUNTER — Telehealth: Payer: Self-pay | Admitting: *Deleted

## 2014-07-29 NOTE — Telephone Encounter (Signed)
Patient called to say that she heard from Radiology that her PET scan was denied by Medicaid.  She wanted [place] to know, even though Radiology assured her that Dr. Burr Medico would be notified.  She has called her Medicaid case manager, Michelle Martinique.

## 2014-07-30 ENCOUNTER — Ambulatory Visit (HOSPITAL_COMMUNITY): Admission: RE | Admit: 2014-07-30 | Payer: Medicaid Other | Source: Ambulatory Visit

## 2014-07-31 ENCOUNTER — Telehealth: Payer: Self-pay

## 2014-07-31 NOTE — Telephone Encounter (Signed)
Returning pt call about PET scan denial. Let pt know Dr Burr Medico is out of office until Monday.  The pt is most concerned about any delay in her treatment this will cause.

## 2014-08-02 ENCOUNTER — Other Ambulatory Visit: Payer: Self-pay | Admitting: Hematology

## 2014-08-02 DIAGNOSIS — C50912 Malignant neoplasm of unspecified site of left female breast: Secondary | ICD-10-CM

## 2014-08-03 ENCOUNTER — Telehealth: Payer: Self-pay | Admitting: *Deleted

## 2014-08-03 ENCOUNTER — Other Ambulatory Visit: Payer: Self-pay | Admitting: *Deleted

## 2014-08-03 NOTE — Telephone Encounter (Signed)
Patient left message on voicemail asking "why she is scheduled with a new doctor no one has talked with me about.  I also have scans I need to know why I have scans."  Stated she needs to stay calm during this ordeal and this is making her extremely anxious and nervous.  Called [phone].  Message left explaining scans were ordered by Dr. Burr Medico at last visit on March 15-2016 and she is scheduled with Dr. Burr Medico for F/U after scans.

## 2014-08-03 NOTE — Telephone Encounter (Signed)
Dr. Burr Medico ordered CT instead of PET but it needs to be done sooner than 08/14/14.  Message left on pt's vm to call back tomorrow to discuss this.  Message left with Amber/Scheduler to see if scan can be moved to sooner.

## 2014-08-05 ENCOUNTER — Encounter: Payer: Self-pay | Admitting: Skilled Nursing Facility1

## 2014-08-05 NOTE — Progress Notes (Signed)
Subjective:     Patient ID: Patricia Carter, female   DOB: 02-16-1956, 58 y.o.   MRN: [id number]  HPI   Review of Systems     Objective:   Physical Exam To help the patient identify some dietary means of increasing their weight.    Assessment:     Pt was contacted via telephone [phone]). Telephones at [facility] were not dialing out to a 919 area code so dietitian used personal phone. Pt did not answer so a message to contact Ernestene Kiel RD,CSO,LDN was left. According to Epic 07/14/14 pt is 96 pounds 5'3'' BMI 17.2    Plan:     No plan identified at this time.

## 2014-08-06 ENCOUNTER — Other Ambulatory Visit: Payer: Medicaid Other

## 2014-08-06 ENCOUNTER — Other Ambulatory Visit: Payer: Self-pay | Admitting: *Deleted

## 2014-08-06 ENCOUNTER — Ambulatory Visit: Payer: Medicaid Other | Admitting: Hematology

## 2014-08-06 DIAGNOSIS — C50912 Malignant neoplasm of unspecified site of left female breast: Secondary | ICD-10-CM

## 2014-08-06 MED ORDER — ALPRAZOLAM 0.5 MG PO TABS: 0.5000 mg | ORAL_TABLET | Freq: Three times a day (TID) | ORAL | Status: DC | PRN

## 2014-08-07 ENCOUNTER — Telehealth: Payer: Self-pay | Admitting: Nutrition

## 2014-08-07 NOTE — Telephone Encounter (Signed)
Patient reports radiation esophagitis has resolved.  Patient states she lost weight during radiation therapy secondary to inability to swallow easily.  Patient is now gaining weight and is able to eat without difficulty.  Patient declines nutrition information at this time and was appreciative of phone call.

## 2014-08-10 ENCOUNTER — Ambulatory Visit: Payer: Medicaid Other | Admitting: Oncology

## 2014-08-12 ENCOUNTER — Telehealth: Payer: Self-pay | Admitting: *Deleted

## 2014-08-12 NOTE — Telephone Encounter (Signed)
Received VM from patient requesting return call in regards to questions about upcoming scans on 08/14/14.  Called and spoke with patient in detail regarding specific times to drink contrast prior to scan. Patient states she does not have the two bottles of contrast at home now - told patient she can pick up the contrast today or tomorrow at the cancer center. Patient is extremely anxious about the scans and any side effects from the contrast. I told patient that most patients tolerate the drinkable contrast without many side effects - told patient the most common side effect that I am aware of is that some people will experience diarrhea several hours after drinking the contrast.  Patient very appreciative of return call and she states she will come today to pickup the contrast. Confirmed arrival times for the scans on 08/14/14.

## 2014-08-14 ENCOUNTER — Encounter (HOSPITAL_COMMUNITY): Payer: Self-pay

## 2014-08-14 ENCOUNTER — Encounter (HOSPITAL_COMMUNITY)
Admission: RE | Admit: 2014-08-14 | Discharge: 2014-08-14 | Disposition: A | Discharge status: Home / Self Care | Payer: Medicaid Other | Source: Ambulatory Visit | Attending: Hematology | Admitting: Hematology

## 2014-08-14 ENCOUNTER — Ambulatory Visit (HOSPITAL_COMMUNITY)
Admission: RE | Admit: 2014-08-14 | Discharge: 2014-08-14 | Disposition: A | Discharge status: Home / Self Care | Payer: Medicaid Other | Source: Ambulatory Visit | Attending: Hematology | Admitting: Hematology

## 2014-08-14 DIAGNOSIS — C50912 Malignant neoplasm of unspecified site of left female breast: Secondary | ICD-10-CM

## 2014-08-14 MED ORDER — TECHNETIUM TC 99M MEDRONATE IV KIT
26.0000 | PACK | Freq: Once | INTRAVENOUS | Status: AC | PRN
  Administered 2014-08-14: 26 via INTRAVENOUS

## 2014-08-14 MED ORDER — IOHEXOL 300 MG/ML  SOLN
80.0000 mL | Freq: Once | INTRAMUSCULAR | Status: AC | PRN
  Administered 2014-08-14: 80 mL via INTRAVENOUS

## 2014-08-17 ENCOUNTER — Ambulatory Visit (HOSPITAL_BASED_OUTPATIENT_CLINIC_OR_DEPARTMENT_OTHER): Payer: Medicaid Other | Admitting: Hematology

## 2014-08-17 ENCOUNTER — Other Ambulatory Visit (HOSPITAL_BASED_OUTPATIENT_CLINIC_OR_DEPARTMENT_OTHER): Payer: Medicaid Other

## 2014-08-17 ENCOUNTER — Telehealth: Payer: Self-pay | Admitting: Hematology

## 2014-08-17 ENCOUNTER — Encounter: Payer: Self-pay | Admitting: Hematology

## 2014-08-17 VITALS — BP 114/74 | HR 103 | Temp 98.3°F | Resp 18 | Ht 63.0 in | Wt 96.5 lb

## 2014-08-17 DIAGNOSIS — C7800 Secondary malignant neoplasm of unspecified lung: Secondary | ICD-10-CM | POA: Diagnosis not present

## 2014-08-17 DIAGNOSIS — F419 Anxiety disorder, unspecified: Secondary | ICD-10-CM

## 2014-08-17 DIAGNOSIS — F329 Major depressive disorder, single episode, unspecified: Secondary | ICD-10-CM

## 2014-08-17 DIAGNOSIS — C50912 Malignant neoplasm of unspecified site of left female breast: Secondary | ICD-10-CM

## 2014-08-17 DIAGNOSIS — C773 Secondary and unspecified malignant neoplasm of axilla and upper limb lymph nodes: Secondary | ICD-10-CM

## 2014-08-17 LAB — CBC WITH DIFFERENTIAL/PLATELET
BASO%: 0.5 % (ref 0.0–2.0)
Basophils Absolute: 0 10*3/uL (ref 0.0–0.1)
EOS%: 4.4 % (ref 0.0–7.0)
Eosinophils Absolute: 0.3 10*3/uL (ref 0.0–0.5)
HEMATOCRIT: 33.2 % — AB (ref 34.8–46.6)
HGB: 11 g/dL — ABNORMAL LOW (ref 11.6–15.9)
LYMPH%: 5.3 % — AB (ref 14.0–49.7)
MCH: 27.4 pg (ref 25.1–34.0)
MCHC: 33.3 g/dL (ref 31.5–36.0)
MCV: 82.3 fL (ref 79.5–101.0)
MONO#: 0.7 10*3/uL (ref 0.1–0.9)
MONO%: 11.6 % (ref 0.0–14.0)
NEUT%: 78.2 % — ABNORMAL HIGH (ref 38.4–76.8)
NEUTROS ABS: 5 10*3/uL (ref 1.5–6.5)
PLATELETS: 406 10*3/uL — AB (ref 145–400)
RBC: 4.03 10*6/uL (ref 3.70–5.45)
RDW: 13.8 % (ref 11.2–14.5)
WBC: 6.3 10*3/uL (ref 3.9–10.3)
lymph#: 0.3 10*3/uL — ABNORMAL LOW (ref 0.9–3.3)

## 2014-08-17 LAB — COMPREHENSIVE METABOLIC PANEL (CC13)
ALT: 8 U/L (ref 0–55)
ANION GAP: 14 meq/L — AB (ref 3–11)
AST: 15 U/L (ref 5–34)
Albumin: 3.2 g/dL — ABNORMAL LOW (ref 3.5–5.0)
Alkaline Phosphatase: 82 U/L (ref 40–150)
BUN: 14.7 mg/dL (ref 7.0–26.0)
CALCIUM: 9.4 mg/dL (ref 8.4–10.4)
CHLORIDE: 100 meq/L (ref 98–109)
CO2: 24 mEq/L (ref 22–29)
CREATININE: 0.7 mg/dL (ref 0.6–1.1)
GLUCOSE: 100 mg/dL (ref 70–140)
POTASSIUM: 4.8 meq/L (ref 3.5–5.1)
Sodium: 138 mEq/L (ref 136–145)
Total Bilirubin: 0.23 mg/dL (ref 0.20–1.20)
Total Protein: 6.9 g/dL (ref 6.4–8.3)

## 2014-08-17 NOTE — Progress Notes (Signed)
[place]  [phone] Fax:([phone]  Clinic New Consult Note   Patient Care Team: No Pcp Per Patient as PCP - General (General Practice) 08/17/2014  CHIEF COMPLAINTS/PURPOSE OF CONSULTATION:  Recurrent breast cancer    Recurrent cancer of left breast   12/23/2009 Surgery left mastectomy with axillary node dissection. Deep surgical margin were positive, pT2N1, tumor size 3.7 x 3.0 x 2.2 cm, grade 3, 2 out of 10 lymph nodes were positive for tumor cells. Patient declined adjuvant chemotherapy, radiation and endocrin therapy   04/30/2014 Progression She presented with worsening cough and dyspnea. CT on 05/01/2015 showed right hilar mass with compression of the SVC and right upper lobe pulmonary artery and obstruction of the right middle lobe bronchus. Moderate pericardial effusion   05/05/2014 Pathology Results Endobronchial biopsy of right lower lobe showed poorly differentiated adenocarcinoma, estrogen receptor positive, CK AE1/AE3 positive. Negative for CK 7, CK 20, TTF-1, S100,etc.  HER2(-). Pericardium biopsy negative.   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   08/17/2014 -  Anti-estrogen oral therapy Letrozole 79m and palbocic 6123mdaily starting 08/18/2014     HISTORY OF PRESENTING ILLNESS:  Patricia Cappiello59o. female is here because of recently diagnosed metastatic recurrent breast cancer.  She was diagnosed with pT2 N1M0, stage IIB, left breast cancer in August 2011. [facility], and declined adjuvant chemotherapy and endocrine therapy. She has not followed with physician regularly after that and does not have annual mammogram.  She started  having dry cough and dyspnea on exertion in May 2016. She had multiple ED visit since then and was treated with multiple courses of antibiotics for bronchitis, which really did not help. In December 2015, she called the bowel pulmonary clinic and was evaluated for worsening cough and dyspnea. A CT of chest was obtained which showed a right hilar mass which cause compression on SVC and right upper lobe pulmonary artery and obstruction of right middle lobe. Moderate pericardial effusion was noticed. She underwent bronchoscopy and pericardial window on 05/01/2014. Biopsy of the right endobronchial lesion showed poorly differentiated adenoma, consistent with breast primary. Pericardial window biopsy and cytology was negative for malignancy.  She started palliative radiation on 06/10/2014, and completed on 06/29/2014, a total of 14 sessions. She tolerated well initially, but developed severe substernal chest pain and dysphagia at the last week of radiation. She still has quite a bit chest pain, is taking Bumex and oxycodone, which helps only some. She is able to eat soft food diet and adequate. She lost about 7 pounds during the radiation. Her dyspnea and cough has significantly improved after radiation.  INTERIM HISTORY: She has recovered well from radiation. Her pain has resovled, her dyspena has also improved, although she still has some dry cough and mild dyspnea with exposure to pollen or with exertion. She still has episodes of anxiety sometime, no pain or other complains. She has good appetite and eats well. She gained a few pounds back.   MEDICAL HISTORY:  Past Medical History  Diagnosis Date  . Breast cancer   . Breast cancer     SURGICAL HISTORY: Past Surgical History  Procedure Laterality Date  . Mastectomy Left     2010  .  Eye surgery      cataract surgery, wears contact left eye  . Subxyphoid pericardial window N/A 05/05/2014    Procedure: SUBXYPHOID PERICARDIAL WINDOW;  Surgeon: Gaye Pollack, MD;  Location: [facility] OR;  Service: Thoracic;  Laterality: N/A;  . Flexible bronchoscopy N/A 05/05/2014    Procedure: FLEXIBLE BRONCHOSCOPY;  Surgeon: Gaye Pollack, MD;  Location: [place];  Service: Thoracic;  Laterality: N/A;  . Video bronchoscopy with endobronchial ultrasound N/A 05/05/2014    Procedure: VIDEO BRONCHOSCOPY WITH ENDOBRONCHIAL ULTRASOUND;  Surgeon: Gaye Pollack, MD;  Location: [place];  Service: Thoracic;  Laterality: N/A;    SOCIAL HISTORY: History   Social History  . Marital Status: Single    Spouse Name: N/A  . Number of Children: N/A  . Years of Education: N/A   Occupational History  . Works PT reception    Social History Main Topics  . Smoking status: Never Smoker   . Smokeless tobacco: Never Used  . Alcohol Use: 0.0 oz/week    0 Standard drinks or equivalent per week     Comment: occ  . Drug Use: No  . Sexual Activity: Not Currently   Other Topics Concern  . Not on file   Social History Narrative    FAMILY HISTORY: Family History  Problem Relation Age of Onset  . Cancer Paternal Aunt     leukemia  . Cancer Father 35    prostate cancer     ALLERGIES:  is allergic to prednisone and zyrtec.  MEDICATIONS:  Current Outpatient Prescriptions  Medication Sig Dispense Refill  . ALPRAZolam (XANAX) 0.5 MG tablet Take 1 tablet (0.5 mg total) by mouth 3 (three) times daily as needed for anxiety. For anxiety 60 tablet 0  . Alum & Mag Hydroxide-Simeth (GI COCKTAIL) SUSP suspension Take 10 mLs by mouth 2 (two) times daily as needed for indigestion. Shake well. 120 mL 0  . hyaluronate sodium (RADIAPLEXRX) GEL Apply 1 application topically once.    Marland Kitchen letrozole (FEMARA) 2.5 MG tablet Take 1 tablet (2.5 mg total) by mouth daily. 30 tablet 5  . Naproxen Sodium (ALEVE PO) Take 1 tablet by mouth 2 (two) times daily as needed (headache).     . oxyCODONE (OXY IR/ROXICODONE) 5 MG immediate release tablet Take 1-2 tablets (5-10 mg total) by mouth every 4 (four) hours  as needed for severe pain. (Patient not taking: Reported on 07/14/2014) 30 tablet 0  . palbociclib (IBRANCE) 125 MG capsule Take 1 capsule (125 mg total) by mouth daily with breakfast. Take whole with food. 21 capsule 0  . sucralfate (CARAFATE) 1 GM/10ML suspension Take 10 mLs (1 g total) by mouth 4 (four) times daily -  with meals and at bedtime. 420 mL 0   No current facility-administered medications for this visit.    REVIEW OF SYSTEMS:   Constitutional: Denies fevers, chills or abnormal night sweats. (+) fatigue  Eyes: Denies blurriness of vision, double vision or watery eyes Ears, nose, mouth, throat, and face: Denies mucositis or sore throat Respiratory:cough and dyspnea has significantly improved after radiation, no wheezes Cardiovascular: (+) mid chest pain, Denies palpitation, or lower extremity swelling Gastrointestinal:  Denies nausea, heartburn or change in bowel habits Skin: Denies abnormal skin rashes Lymphatics: Denies new lymphadenopathy or easy bruising Neurological:Denies numbness, tingling or new weaknesses Behavioral/Psych: Mood is stable, no new changes  All other systems were reviewed with the patient and are negative.  PHYSICAL EXAMINATION: ECOG PERFORMANCE STATUS: 2 - Symptomatic, <50% confined  to bed  Filed Vitals:   08/17/14 1458  BP: 114/74  Pulse: 103  Temp: 98.3 F (36.8 C)  Resp: 18   Filed Weights   08/17/14 1458  Weight: 96 lb 8 oz (43.772 kg)    GENERAL:alert, no distress and comfortable SKIN: skin color, texture, turgor are normal, no rashes or significant lesions EYES: normal, conjunctiva are pink and non-injected, sclera clear OROPHARYNX:no exudate, no erythema and lips, buccal mucosa, and tongue normal  NECK: supple, thyroid normal size, non-tender, without nodularity LYMPH:  no palpable lymphadenopathy in the cervical, axillary or inguinal LUNGS: clear to auscultation and percussion with normal breathing effort HEART: regular rate &  rhythm and no murmurs and no lower extremity edema ABDOMEN:abdomen soft, non-tender and normal bowel sounds Musculoskeletal:no cyanosis of digits and no clubbing  PSYCH: alert & oriented x 3 with fluent speech NEURO: no focal motor/sensory deficits  LABORATORY DATA:  I have reviewed the data as listed Lab Results  Component Value Date   WBC 6.3 08/17/2014   HGB 11.0* 08/17/2014   HCT 33.2* 08/17/2014   MCV 82.3 08/17/2014   PLT 406* 08/17/2014    Recent Labs  05/04/14 1725 05/07/14 0348  07/03/14 0855 07/14/14 1206 08/17/14 1440  NA 140 139  < > 136 140 138  K 4.0 4.1  < > 4.2 4.4 4.8  CL 105 106  --  100  --   --   CO2 28 29  < > 19 30* 24  GLUCOSE 124* 93  < > 77 93 100  BUN 14 11  < > 26* 27.4* 14.7  CREATININE 0.95 0.79  < > 0.95 0.9 0.7  CALCIUM 9.3 8.8  < > 9.5 9.7 9.4  GFRNONAA 64* 89*  --  64*  --   --   GFRAA 74* >90  --  74*  --   --   PROT 6.4  --   --  7.5 6.7 6.9  ALBUMIN 4.0  --   --  4.5 4.0 3.2*  AST 23  --   --  _0 ALT 10  --   --  _1 ALKPHOS 103  --   --  80 72 82  BILITOT 0.3  --   --  1.0 0.41 0.23  < > = values in this interval not displayed. PATHOLOGY REPORT: Diagnosis 05/05/2014 1. Pericardium, biopsy - BENIGN MESOTHELIAL LINED FIBROADIPOSE TISSUE. 2. Endobronchial biopsy, right middle lobe - POORLY DIFFERENTIATED CARCINOMA - SEE COMMENT. 3. Endobronchial biopsy, right lower lobe - POORLY DIFFERENTIATED CARCINOMA. - SEE COMMENT. 1 of 2 FINAL for LEIGHANA, NEYMAN ([id number]) Microscopic Comment 2. , 3. The malignant cells are positive for cytokeratin AE1/AE3 and estrogen receptor. They are negative for cytokeratin 7, cytokeratin 20, S-100, GCDFP, CD45, chromogranin, cytokeratin 5/6, synaptophysin, TTF-1. This immunohistochemical profile is not specific. However, given the patient's history, the positive staining for estrogen receptor is most suggestive of metastatic primary breast carcinoma. A HER-2 will be attempted and  the results reportedly separately. (JBK:ecj 05/11/2014)  RADIOGRAPHIC STUDIES: I have personally reviewed the radiological images as listed and agreed with the findings in the report.  CT chest 05/01/2015  IMPRESSION: 1. Negative for pulmonary embolus. 2. Right hilar mass with compression of the SVC and right upper lobe pulmonary artery and obstruction of the right middle lobe bronchus. Right middle lobe mass and postobstructive collapse. Findings are highly worrisome for primary bronchogenic carcinoma. 3. Possible satellite right lower lobe nodule. 4.  Moderate pericardial effusion. 5. Scattered small nodular and ground-glass opacities in the right upper lobe, likely infectious or inflammatory in etiology.  CT chest w contrast 07/03/2014 IMPRESSION: Interval decrease in sizes of RIGHT upper lobe mass, RIGHT hilar adenopathy and mediastinal adenopathy.  RIGHT lower lobe pulmonary nodule decreased in size with a single RIGHT apex nodule minimally increased in size since previous study.   Electronically Signed   By: Lavonia Dana M.D.   On: 07/03/2014 10:48   CT abdomen and pelvis with contrast 08/14/2014 IMPRESSION: 1. No compelling findings of metastatic disease to the abdomen. 2. Wall thickening in the descending duodenum. Although potentially from The from peristalsis, duodenitis is not excluded. Endoscopy or upper GI fluoroscopic exam may be helpful in further characterization. 3. New ground-glass opacities in the right lower lobe and right middle lobe suggesting alveolitis or pulmonary hemorrhage. 4. Atherosclerosis. 5. Lower lumbar degenerative disc disease.  Bone scan 08/14/2014 IMPRESSION: Solitary focus of abnormal uptake is seen involving the lateral portion of a left middle rib concerning for metastatic lesion.    ASSESSMENT & PLAN:  59 year old female with past medical history of stage IIB left breast cancer, status post mastectomy and axillary lymph node dissection  in 2011. Now presented with metastatic disease to lungs and lymph nodes.  1. 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 She never smoked, and immunostain does not support primary lung cancer. -We discussed the natural history of her cancer, unfortunately this is incurable at this stage, but treatable and we do have many treatment options.  -She has had good response to palliative radiation with significant symptom improvement. -PET scan was denied by her insurance. Bone scan showed a possible left lateral rib metastasis. CT of the abdomen was negative for other metastasis. -Her lung metastasis having treated with radiation with good symptomatic and radiographic response -Giving no other significant visceral metastasis, and her recent side effects from radiation, I recommend letrozole and Ibrance as the first line systemic therapy. Potential side effects, which includes but not not limited to, fatigue, hot flash, skiing and vaginal dryness, musculoskeletal discomfort, low blood counts, risk of infection and bleeding, need for blood transfusion, etc. were discussed with patient and she agrees to proceed. -The medications have been approved by her insurance, and she'll pick up at [facility] today and start tomorrow. -If she has high co-pay for the medication, she may have to wait until she has money to pay the co-pay. I told her to start letrozole first if she can afford.    2. Anxiety and depression, coping and social support Her anxiety and depression seems better lately.  -I referred her her to social worker -Her daughter lives in [place], she is scheduled to have a second opinion at [facility] this Thursday. I'll send her medical records over. She will get a copy of her scan images from our radiology  Department today.   Follow-up, I'll see her back in 2 weeks for follow-up.    All questions were answered. The patient knows to call the clinic with any problems, questions or concerns. I spent 25 minutes counseling the patient face to face. The total time spent in the appointment was 30 minutes and more than 50% was on counseling.     Truitt Merle, MD 08/17/2014 10:38 PM

## 2014-08-17 NOTE — Telephone Encounter (Signed)
Gave patient avs report and appointments for May  °

## 2014-08-19 ENCOUNTER — Telehealth: Payer: Self-pay | Admitting: *Deleted

## 2014-08-19 NOTE — Telephone Encounter (Signed)
Lorrin called with message for Dr. Burr Medico.  "I'd like someone to tell her how much I appreciate how much she's doing for me.  We're going to do this thing!"  Next F/U is 08-31-2014 with Dr. Burr Medico.

## 2014-08-20 ENCOUNTER — Telehealth: Payer: Self-pay | Admitting: Hematology

## 2014-08-20 NOTE — Telephone Encounter (Signed)
Faxed pt medical records to [facility] [phone]

## 2014-08-25 ENCOUNTER — Ambulatory Visit (HOSPITAL_BASED_OUTPATIENT_CLINIC_OR_DEPARTMENT_OTHER): Payer: Medicaid Other | Admitting: Nurse Practitioner

## 2014-08-25 ENCOUNTER — Other Ambulatory Visit: Payer: Self-pay | Admitting: *Deleted

## 2014-08-25 ENCOUNTER — Telehealth: Payer: Self-pay | Admitting: *Deleted

## 2014-08-25 ENCOUNTER — Other Ambulatory Visit (HOSPITAL_BASED_OUTPATIENT_CLINIC_OR_DEPARTMENT_OTHER): Payer: Medicaid Other

## 2014-08-25 VITALS — BP 100/54 | HR 93 | Temp 98.7°F | Resp 18 | Wt 95.9 lb

## 2014-08-25 DIAGNOSIS — R530 Neoplastic (malignant) related fatigue: Secondary | ICD-10-CM | POA: Diagnosis not present

## 2014-08-25 DIAGNOSIS — F419 Anxiety disorder, unspecified: Secondary | ICD-10-CM | POA: Diagnosis not present

## 2014-08-25 DIAGNOSIS — R110 Nausea: Secondary | ICD-10-CM

## 2014-08-25 DIAGNOSIS — C50912 Malignant neoplasm of unspecified site of left female breast: Secondary | ICD-10-CM

## 2014-08-25 LAB — CBC WITH DIFFERENTIAL/PLATELET
BASO%: 1.7 % (ref 0.0–2.0)
BASOS ABS: 0.1 10*3/uL (ref 0.0–0.1)
EOS%: 2.1 % (ref 0.0–7.0)
Eosinophils Absolute: 0.1 10*3/uL (ref 0.0–0.5)
HCT: 35.3 % (ref 34.8–46.6)
HEMOGLOBIN: 11.6 g/dL (ref 11.6–15.9)
LYMPH%: 3.7 % — ABNORMAL LOW (ref 14.0–49.7)
MCH: 27.1 pg (ref 25.1–34.0)
MCHC: 32.9 g/dL (ref 31.5–36.0)
MCV: 82.4 fL (ref 79.5–101.0)
MONO#: 0.1 10*3/uL (ref 0.1–0.9)
MONO%: 2.1 % (ref 0.0–14.0)
NEUT#: 5.9 10*3/uL (ref 1.5–6.5)
NEUT%: 90.4 % — AB (ref 38.4–76.8)
Platelets: 445 10*3/uL — ABNORMAL HIGH (ref 145–400)
RBC: 4.29 10*6/uL (ref 3.70–5.45)
RDW: 14 % (ref 11.2–14.5)
WBC: 6.5 10*3/uL (ref 3.9–10.3)
lymph#: 0.2 10*3/uL — ABNORMAL LOW (ref 0.9–3.3)

## 2014-08-25 LAB — COMPREHENSIVE METABOLIC PANEL (CC13)
ALBUMIN: 3 g/dL — AB (ref 3.5–5.0)
ALK PHOS: 81 U/L (ref 40–150)
ALT: <6 U/L (ref 0–55)
AST: 14 U/L (ref 5–34)
Anion Gap: 13 mEq/L — ABNORMAL HIGH (ref 3–11)
BUN: 16.5 mg/dL (ref 7.0–26.0)
CO2: 23 meq/L (ref 22–29)
Calcium: 9.4 mg/dL (ref 8.4–10.4)
Chloride: 102 mEq/L (ref 98–109)
Creatinine: 0.8 mg/dL (ref 0.6–1.1)
EGFR: 79 mL/min/{1.73_m2} — ABNORMAL LOW (ref >90)
Glucose: 85 mg/dl (ref 70–140)
Potassium: 4.6 mEq/L (ref 3.5–5.1)
SODIUM: 139 meq/L (ref 136–145)
TOTAL PROTEIN: 6.7 g/dL (ref 6.4–8.3)
Total Bilirubin: 0.32 mg/dL (ref 0.20–1.20)

## 2014-08-25 MED ORDER — PROCHLORPERAZINE MALEATE 10 MG PO TABS: 10.0000 mg | ORAL_TABLET | Freq: Four times a day (QID) | ORAL | Status: DC | PRN

## 2014-08-25 NOTE — Telephone Encounter (Signed)
Message received is "patient is crying, medication has caused zig zaggy lines and she is in severe distress."  Called Cohen who reports "experiencing symptoms similar to her teenage migraines after taking Ibrance and Letrozole.  Today is day seven of the cycle and the first time this has happened.  Is there a connection and may I take two aleve and 1 and a half of the alprazolam 0.5 mg for the pain and anxiety?  It lasted a few minutes but was very scary with generalized weakness."  Has laid down being still, symptoms have gone away at time of call.  Denies unsteadiness, headache or any change in degree of weakness or dizziness.  "I am able to walk and climb stairs in my apartment.  Lying down now.  Return number is [phone].   Next scheduled F/U 08-31-2014.

## 2014-08-25 NOTE — Telephone Encounter (Signed)
Spoke with Arta Bruce [facility] who reports Leslee Home shows fatigue and peripheral neuropathy.  Letrozole does show 4 - 20% chance of headache.

## 2014-08-27 ENCOUNTER — Encounter: Payer: Self-pay | Admitting: Nurse Practitioner

## 2014-08-27 DIAGNOSIS — R530 Neoplastic (malignant) related fatigue: Secondary | ICD-10-CM | POA: Insufficient documentation

## 2014-08-27 DIAGNOSIS — F419 Anxiety disorder, unspecified: Secondary | ICD-10-CM | POA: Insufficient documentation

## 2014-08-27 DIAGNOSIS — R110 Nausea: Secondary | ICD-10-CM | POA: Insufficient documentation

## 2014-08-27 NOTE — Assessment & Plan Note (Signed)
Patient has a history of chronic anxiety in the past.  Patient states that she became very anxious earlier today when she began thinking about her health issues.  She is also having problems with financial issues as well.  She states that her apartment rent has just been increased.  Patient states that she developed some brief mild headache, mild nausea, and saw some"zig zag lines" from her right eye only for just a few seconds.  On exam-patient appears completely neurologically intact.  Patient denies any vision issues whatsoever.  Most likely, brief symptoms secondary to anxiety attack.  Patient states that she has said to take on an as-needed basis.  Patient was advised to try taking the Xanax when she feels increased anxiety in the future.  Also, patient was advised to call/return for directed to the emergency for any worsening symptoms whatsoever.

## 2014-08-27 NOTE — Assessment & Plan Note (Signed)
Patient states that she had an anxiety attack earlier this morning; and developed some mild nausea with the anxiety attack.  She has no anti-emetics at home.  She denies any nausea at present.  Patient was given a prescription for Compazine to try at home.

## 2014-08-27 NOTE — Assessment & Plan Note (Signed)
Patient completed palliative radiation therapy on the 29th 2016.  She continues undergoing Ibrance oral therapy; as well as letrozole oral therapy.  Patient is scheduled to return on 08/31/2014 for labs and a follow-up visit.

## 2014-08-27 NOTE — Assessment & Plan Note (Signed)
Patient is complaining of continued, chronic fatigue.  She states that she typically is too tired to drive for soft to the cancer Center; and relies on family/friend assistance for transportation at times.  Patient was encouraged to remain as active as possible.

## 2014-08-27 NOTE — Progress Notes (Signed)
SYMPTOM MANAGEMENT CLINIC   HPI: Patricia Carter 59 y.o. female diagnosed with breast cancer.  Patient is status post palliative radiation therapy completed in February 2016.  Currently undergoing Ibrance  oral therapy; as well as letrozole therapy.  Patient has a history of chronic anxiety in the past.  Patient states that she became very anxious earlier today when she began thinking about her health issues.  She is also having problems with financial issues as well.  She states that her apartment rent has just been increased.  Patient states that she developed some brief mild headache, mild nausea, and saw some"zig zag lines" from her right eye only for just a few seconds.    HPI  ROS  Past Medical History  Diagnosis Date  . Breast cancer   . Breast cancer     Past Surgical History  Procedure Laterality Date  . Mastectomy Left     2010  . Eye surgery      cataract surgery, wears contact left eye  . Subxyphoid pericardial window N/A 05/05/2014    Procedure: SUBXYPHOID PERICARDIAL WINDOW;  Surgeon: Gaye Pollack, MD;  Location: [facility] OR;  Service: Thoracic;  Laterality: N/A;  . Flexible bronchoscopy N/A 05/05/2014    Procedure: FLEXIBLE BRONCHOSCOPY;  Surgeon: Gaye Pollack, MD;  Location: [place];  Service: Thoracic;  Laterality: N/A;  . Video bronchoscopy with endobronchial ultrasound N/A 05/05/2014    Procedure: VIDEO BRONCHOSCOPY WITH ENDOBRONCHIAL ULTRASOUND;  Surgeon: Gaye Pollack, MD;  Location: [place];  Service: Thoracic;  Laterality: N/A;    has Dyspnea; Lung mass; Acute pericardial effusion; Recurrent cancer of left breast; Nausea without vomiting; Neoplastic malignant related fatigue; and Anxiety on her problem list.    is allergic to prednisone and zyrtec.    Medication List       This list is accurate as of: 08/25/14 11:59 PM.  Always use your most recent med list.               ALEVE PO  Take 1 tablet by mouth 2 (two) times daily as needed (headache).     ALPRAZolam 0.5 MG tablet  Commonly known as:  XANAX  Take 1 tablet (0.5 mg total) by mouth 3 (three) times daily as needed for anxiety. For anxiety     gi cocktail Susp suspension  Take 10 mLs by mouth 2 (two) times daily as needed for indigestion. Shake well.     hyaluronate sodium Gel  Apply 1 application topically once.     letrozole 2.5 MG tablet  Commonly known as:  FEMARA  Take 1 tablet (2.5 mg total) by mouth daily.     oxyCODONE 5 MG immediate release tablet  Commonly known as:  Oxy IR/ROXICODONE  Take 1-2 tablets (5-10 mg total) by mouth every 4 (four) hours as needed for severe pain.     palbociclib 125 MG capsule  Commonly known as:  IBRANCE  Take 1 capsule (125 mg total) by mouth daily with breakfast. Take whole with food.     prochlorperazine 10 MG tablet  Commonly known as:  COMPAZINE  Take 1 tablet (10 mg total) by mouth every 6 (six) hours as needed for nausea or vomiting.     sucralfate 1 GM/10ML suspension  Commonly known as:  CARAFATE  Take 10 mLs (1 g total) by mouth 4 (four) times daily -  with meals and at bedtime.         PHYSICAL EXAMINATION  Oncology Vitals  08/25/2014 08/17/2014 07/14/2014 07/03/2014 07/03/2014 07/03/2014 07/03/2014  Height - 160 cm 160 cm - - - -  Weight 43.5 kg 43.772 kg 43.954 kg - - - -  Weight (lbs) 95 lbs 14 oz 96 lbs 8 oz 96 lbs 14 oz - - - -  BMI (kg/m2) - 17.09 kg/m2 17.17 kg/m2 - - - -  Temp 98.7 98.3 98.7 - - 97.5 -  Pulse 93 103 94 91 88 91 105  Resp _0 SpO2 100 98 100 100 99 99 99  BSA (m2) - 1.4 m2 1.4 m2 - - - -   BP Readings from Last 3 Encounters:  08/25/14 100/54  08/17/14 114/74  07/14/14 93/59    Physical Exam  Constitutional: She is oriented to person, place, and time and well-developed, well-nourished, and in no distress.  HENT:  Head: Normocephalic and atraumatic.  Mouth/Throat: Oropharynx is clear and moist.  Eyes: Conjunctivae and EOM are normal. Pupils are equal, round, and reactive to  light. Right eye exhibits no discharge. Left eye exhibits no discharge. No scleral icterus.  Neck: Normal range of motion. Neck supple. No JVD present. No tracheal deviation present. No thyromegaly present.  Cardiovascular: Normal rate, regular rhythm, normal heart sounds and intact distal pulses.   Pulmonary/Chest: Effort normal and breath sounds normal. No stridor. No respiratory distress. She has no wheezes. She has no rales. She exhibits no tenderness.  Abdominal: Soft. Bowel sounds are normal. She exhibits no distension and no mass. There is no tenderness. There is no rebound and no guarding.  Musculoskeletal: Normal range of motion. She exhibits no edema or tenderness.  Lymphadenopathy:    She has no cervical adenopathy.  Neurological: She is alert and oriented to person, place, and time. Gait normal.  Skin: Skin is warm and dry. No rash noted. No erythema. There is pallor.  Psychiatric: Affect normal.  Nursing note and vitals reviewed.   LABORATORY DATA:. Appointment on 08/25/2014  Component Date Value Ref Range Status  . WBC 08/25/2014 6.5  3.9 - 10.3 10e3/uL Final  . NEUT# 08/25/2014 5.9  1.5 - 6.5 10e3/uL Final  . HGB 08/25/2014 11.6  11.6 - 15.9 g/dL Final  . HCT 08/25/2014 35.3  34.8 - 46.6 % Final  . Platelets 08/25/2014 445* 145 - 400 10e3/uL Final  . MCV 08/25/2014 82.4  79.5 - 101.0 fL Final  . MCH 08/25/2014 27.1  25.1 - 34.0 pg Final  . MCHC 08/25/2014 32.9  31.5 - 36.0 g/dL Final  . RBC 08/25/2014 4.29  3.70 - 5.45 10e6/uL Final  . RDW 08/25/2014 14.0  11.2 - 14.5 % Final  . lymph# 08/25/2014 0.2* 0.9 - 3.3 10e3/uL Final  . MONO# 08/25/2014 0.1  0.1 - 0.9 10e3/uL Final  . Eosinophils Absolute 08/25/2014 0.1  0.0 - 0.5 10e3/uL Final  . Basophils Absolute 08/25/2014 0.1  0.0 - 0.1 10e3/uL Final  . NEUT% 08/25/2014 90.4* 38.4 - 76.8 % Final  . LYMPH% 08/25/2014 3.7* 14.0 - 49.7 % Final  . MONO% 08/25/2014 2.1  0.0 - 14.0 % Final  . EOS% 08/25/2014 2.1  0.0 - 7.0 %  Final  . BASO% 08/25/2014 1.7  0.0 - 2.0 % Final  . Sodium 08/25/2014 139  136 - 145 mEq/L Final  . Potassium 08/25/2014 4.6  3.5 - 5.1 mEq/L Final  . Chloride 08/25/2014 102  98 - 109 mEq/L Final  . CO2 08/25/2014 23  22 - 29 mEq/L Final  .  Glucose 08/25/2014 85  70 - 140 mg/dl Final  . BUN 08/25/2014 16.5  7.0 - 26.0 mg/dL Final  . Creatinine 08/25/2014 0.8  0.6 - 1.1 mg/dL Final  . Total Bilirubin 08/25/2014 0.32  0.20 - 1.20 mg/dL Final  . Alkaline Phosphatase 08/25/2014 81  40 - 150 U/L Final  . AST 08/25/2014 14  5 - 34 U/L Final  . ALT 08/25/2014 <6  0 - 55 U/L Final  . Total Protein 08/25/2014 6.7  6.4 - 8.3 g/dL Final  . Albumin 08/25/2014 3.0* 3.5 - 5.0 g/dL Final  . Calcium 08/25/2014 9.4  8.4 - 10.4 mg/dL Final  . Anion Gap 08/25/2014 13* 3 - 11 mEq/L Final  . EGFR 08/25/2014 79* >90 ml/min/1.73 m2 Final   eGFR is calculated using the CKD-EPI Creatinine Equation (2009)     RADIOGRAPHIC STUDIES: No results found.  ASSESSMENT/PLAN:    Recurrent cancer of left breast Patient completed palliative radiation therapy on the 29th 2016.  She continues undergoing Ibrance oral therapy; as well as letrozole oral therapy.  Patient is scheduled to return on 08/31/2014 for labs and a follow-up visit.    Nausea without vomiting Patient states that she had an anxiety attack earlier this morning; and developed some mild nausea with the anxiety attack.  She has no anti-emetics at home.  She denies any nausea at present.  Patient was given a prescription for Compazine to try at home.   Neoplastic malignant related fatigue Patient is complaining of continued, chronic fatigue.  She states that she typically is too tired to drive for soft to the cancer Center; and relies on family/friend assistance for transportation at times.  Patient was encouraged to remain as active as possible.   Anxiety Patient has a history of chronic anxiety in the past.  Patient states that she became very  anxious earlier today when she began thinking about her health issues.  She is also having problems with financial issues as well.  She states that her apartment rent has just been increased.  Patient states that she developed some brief mild headache, mild nausea, and saw some"zig zag lines" from her right eye only for just a few seconds.  On exam-patient appears completely neurologically intact.  Patient denies any vision issues whatsoever.  Most likely, brief symptoms secondary to anxiety attack.  Patient states that she has said to take on an as-needed basis.  Patient was advised to try taking the Xanax when she feels increased anxiety in the future.  Also, patient was advised to call/return for directed to the emergency for any worsening symptoms whatsoever.   Also, have requested a Social Work consult; as well as a Editor, commissioning.   Patient stated understanding of all instructions; and was in agreement with this plan of care. The patient knows to call the clinic with any problems, questions or concerns.   Review/collaboration with Dr. Burr Medico  regarding all aspects of patient's visit today.   Total time spent with patient was 25 minutes;  with greater than 75 percent of that time spent in face to face counseling regarding patient's symptoms,  and coordination of care and follow up.  Disclaimer: This note was dictated with voice recognition software. Similar sounding words can inadvertently be transcribed and may not be corrected upon review.   Drue Second, NP 08/27/2014

## 2014-08-28 ENCOUNTER — Telehealth: Payer: Self-pay | Admitting: *Deleted

## 2014-08-31 ENCOUNTER — Ambulatory Visit (HOSPITAL_BASED_OUTPATIENT_CLINIC_OR_DEPARTMENT_OTHER): Payer: Medicaid Other | Admitting: Hematology

## 2014-08-31 ENCOUNTER — Encounter: Payer: Self-pay | Admitting: Hematology

## 2014-08-31 ENCOUNTER — Other Ambulatory Visit (HOSPITAL_BASED_OUTPATIENT_CLINIC_OR_DEPARTMENT_OTHER): Payer: Medicaid Other

## 2014-08-31 ENCOUNTER — Telehealth: Payer: Self-pay | Admitting: Hematology

## 2014-08-31 ENCOUNTER — Telehealth: Payer: Self-pay | Admitting: *Deleted

## 2014-08-31 ENCOUNTER — Ambulatory Visit: Payer: Medicaid Other

## 2014-08-31 VITALS — BP 110/71 | HR 102 | Temp 98.3°F | Resp 20 | Ht 63.0 in | Wt 96.2 lb

## 2014-08-31 DIAGNOSIS — C773 Secondary and unspecified malignant neoplasm of axilla and upper limb lymph nodes: Secondary | ICD-10-CM

## 2014-08-31 DIAGNOSIS — C7951 Secondary malignant neoplasm of bone: Secondary | ICD-10-CM | POA: Diagnosis not present

## 2014-08-31 DIAGNOSIS — C50912 Malignant neoplasm of unspecified site of left female breast: Secondary | ICD-10-CM

## 2014-08-31 DIAGNOSIS — F419 Anxiety disorder, unspecified: Secondary | ICD-10-CM | POA: Diagnosis not present

## 2014-08-31 DIAGNOSIS — C7801 Secondary malignant neoplasm of right lung: Secondary | ICD-10-CM | POA: Diagnosis not present

## 2014-08-31 LAB — CBC WITH DIFFERENTIAL/PLATELET
BASO%: 0.5 % (ref 0.0–2.0)
BASOS ABS: 0 10*3/uL (ref 0.0–0.1)
EOS ABS: 0.1 10*3/uL (ref 0.0–0.5)
EOS%: 1.8 % (ref 0.0–7.0)
HCT: 33.8 % — ABNORMAL LOW (ref 34.8–46.6)
HGB: 11.1 g/dL — ABNORMAL LOW (ref 11.6–15.9)
LYMPH%: 7.6 % — ABNORMAL LOW (ref 14.0–49.7)
MCH: 27.2 pg (ref 25.1–34.0)
MCHC: 32.8 g/dL (ref 31.5–36.0)
MCV: 82.9 fL (ref 79.5–101.0)
MONO#: 0.1 10*3/uL (ref 0.1–0.9)
MONO%: 2.9 % (ref 0.0–14.0)
NEUT%: 87.2 % — ABNORMAL HIGH (ref 38.4–76.8)
NEUTROS ABS: 2.5 10*3/uL (ref 1.5–6.5)
PLATELETS: 270 10*3/uL (ref 145–400)
RBC: 4.08 10*6/uL (ref 3.70–5.45)
RDW: 14.3 % (ref 11.2–14.5)
WBC: 2.8 10*3/uL — ABNORMAL LOW (ref 3.9–10.3)
lymph#: 0.2 10*3/uL — ABNORMAL LOW (ref 0.9–3.3)

## 2014-08-31 LAB — COMPREHENSIVE METABOLIC PANEL (CC13)
ALT: 6 U/L (ref 0–55)
ANION GAP: 10 meq/L (ref 3–11)
AST: 12 U/L (ref 5–34)
Albumin: 3.3 g/dL — ABNORMAL LOW (ref 3.5–5.0)
Alkaline Phosphatase: 85 U/L (ref 40–150)
BUN: 13.3 mg/dL (ref 7.0–26.0)
CHLORIDE: 101 meq/L (ref 98–109)
CO2: 28 mEq/L (ref 22–29)
Calcium: 9.3 mg/dL (ref 8.4–10.4)
Creatinine: 0.8 mg/dL (ref 0.6–1.1)
EGFR: 84 mL/min/{1.73_m2} — ABNORMAL LOW (ref >90)
GLUCOSE: 114 mg/dL (ref 70–140)
Potassium: 4.3 mEq/L (ref 3.5–5.1)
SODIUM: 139 meq/L (ref 136–145)
TOTAL PROTEIN: 6.6 g/dL (ref 6.4–8.3)
Total Bilirubin: 0.23 mg/dL (ref 0.20–1.20)

## 2014-08-31 NOTE — Telephone Encounter (Signed)
Pt left message stating "it is happening again, zig zaggy lines and tingling in hands". Spoke with patient- states it did not last as long today, no headache, took [place] after she left the VM. Has appt with Dr Burr Medico today @ 1400 with labs first. TO come as scheduled. Will call if problems prior to appt.

## 2014-08-31 NOTE — Telephone Encounter (Signed)
per pof to sch appt-gave pt copy of sch °

## 2014-08-31 NOTE — Progress Notes (Signed)
[name]  [phone] [phone]  Clinic New Consult Note   Patient Care Team: No Pcp Per Patient as PCP - General (General Practice) [facility] 08/31/2014  CHIEF COMPLAINTS/PURPOSE OF CONSULTATION:  Recurrent breast cancer    Recurrent cancer of left breast   12/23/2009 Surgery left mastectomy with axillary node dissection. Deep surgical margin were positive, pT2N1, tumor size 3.7 x 3.0 x 2.2 cm, grade 3, 2 out of 10 lymph nodes were positive for tumor cells. Patient declined adjuvant chemotherapy, radiation and endocrin therapy   04/30/2014 Progression She presented with worsening cough and dyspnea. CT on 05/01/2015 showed right hilar mass with compression of the SVC and right upper lobe pulmonary artery and obstruction of the right middle lobe bronchus. Moderate pericardial effusion   05/05/2014 Pathology Results Endobronchial biopsy of right lower lobe showed poorly differentiated adenocarcinoma, estrogen receptor positive, CK AE1/AE3 positive. Negative for CK 7, CK 20, TTF-1, S100,etc.  HER2(-). Pericardium biopsy negative.   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   08/17/2014 -  Anti-estrogen oral therapy Letrozole $RemoveBeforeDEI'25mg'bVryBldPGuUxudxl$  daily and palbociclib $RemoveBeforeD'125mg'LvHvUDIPnLVVZm$  daily 3 weeks on 1 week off, starting 08/18/2014     HISTORY OF PRESENTING ILLNESS:  Patricia Carter 59 y.o. female is here because of recently diagnosed metastatic recurrent breast cancer.  She was diagnosed with pT2 N1M0, stage IIB, left breast cancer in August 2011. She underwent left mastectomy and reconstruction with implant and flap on 12/09/2009 at [facility]. She met to medical oncologist after surgery, and declined adjuvant chemotherapy and endocrine therapy. She has not followed with physician  regularly after that and does not have annual mammogram.  She started having dry cough and dyspnea on exertion in May 2016. She had multiple ED visit since then and was treated with multiple courses of antibiotics for bronchitis, which really did not help. In December 2015, she called the bowel pulmonary clinic and was evaluated for worsening cough and dyspnea. A CT of chest was obtained which showed a right hilar mass which cause compression on SVC and right upper lobe pulmonary artery and obstruction of right middle lobe. Moderate pericardial effusion was noticed. She underwent bronchoscopy and pericardial window on 05/01/2014. Biopsy of the right endobronchial lesion showed poorly differentiated adenoma, consistent with breast primary. Pericardial window biopsy and cytology was negative for malignancy.  She started palliative radiation on 06/10/2014, and completed on 06/29/2014, a total of 14 sessions. She tolerated well initially, but developed severe substernal chest pain and dysphagia at the last week of radiation. She still has quite a bit chest pain, is taking Bumex and oxycodone, which helps only some. She is able to eat soft food diet and adequate. She lost about 7 pounds during the radiation. Her dyspnea and cough has significantly improved after radiation.  INTERIM HISTORY: She returns for follow up. She started letrozole and Ibrance on 08/19/2014. She did notice more fatigue after she started the new medication, and mild intermittent nausea, usually after taking the pill. She takes them in the morning after breakfast, and usually takes a nap after worse. She feels pretty good in the afternoon. She also complains of intermittent headaches, being very anxious and saw some"zig zag lines" from her right eye only for just a few seconds, which she had before with her migraine headaches. She denies  any new vision change or other neurologic symptoms.  She was seen by Dr. Hiram Comber at [facility] for second opinion  of her breast cancer, who agrees with her current treatment plan.    MEDICAL HISTORY:  Past Medical History  Diagnosis Date  . Breast cancer   . Breast cancer     SURGICAL HISTORY: Past Surgical History  Procedure Laterality Date  . Mastectomy Left     2010  . Eye surgery      cataract surgery, wears contact left eye  . Subxyphoid pericardial window N/A 05/05/2014    Procedure: SUBXYPHOID PERICARDIAL WINDOW;  Surgeon: Gaye Pollack, MD;  Location: [facility] OR;  Service: Thoracic;  Laterality: N/A;  . Flexible bronchoscopy N/A 05/05/2014    Procedure: FLEXIBLE BRONCHOSCOPY;  Surgeon: Gaye Pollack, MD;  Location: [place];  Service: Thoracic;  Laterality: N/A;  . Video bronchoscopy with endobronchial ultrasound N/A 05/05/2014    Procedure: VIDEO BRONCHOSCOPY WITH ENDOBRONCHIAL ULTRASOUND;  Surgeon: Gaye Pollack, MD;  Location: [place];  Service: Thoracic;  Laterality: N/A;    SOCIAL HISTORY: History   Social History  . Marital Status: Single    Spouse Name: N/A  . Number of Children: N/A  . Years of Education: N/A   Occupational History  . Works PT reception    Social History Main Topics  . Smoking status: Never Smoker   . Smokeless tobacco: Never Used  . Alcohol Use: 0.0 oz/week    0 Standard drinks or equivalent per week     Comment: occ  . Drug Use: No  . Sexual Activity: Not Currently   Other Topics Concern  . Not on file   Social History Narrative    FAMILY HISTORY: Family History  Problem Relation Age of Onset  . Cancer Paternal Aunt     leukemia  . Cancer Father 25    prostate cancer     ALLERGIES:  is allergic to prednisone and zyrtec.  MEDICATIONS:  Current Outpatient Prescriptions  Medication Sig Dispense Refill  . ALPRAZolam (XANAX) 0.5 MG tablet Take 1 tablet (0.5 mg total) by mouth 3 (three) times daily as needed for anxiety. For anxiety 60 tablet 0  . Alum & Mag Hydroxide-Simeth (GI COCKTAIL) SUSP suspension Take 10 mLs by mouth 2 (two) times daily  as needed for indigestion. Shake well. 120 mL 0  . hyaluronate sodium (RADIAPLEXRX) GEL Apply 1 application topically once.    Marland Kitchen letrozole (FEMARA) 2.5 MG tablet Take 1 tablet (2.5 mg total) by mouth daily. 30 tablet 5  . Naproxen Sodium (ALEVE PO) Take 1 tablet by mouth 2 (two) times daily as needed (headache).     . oxyCODONE (OXY IR/ROXICODONE) 5 MG immediate release tablet Take 1-2 tablets (5-10 mg total) by mouth every 4 (four) hours as needed for severe pain. 30 tablet 0  . palbociclib (IBRANCE) 125 MG capsule Take 1 capsule (125 mg total) by mouth daily with breakfast. Take whole with food. 21 capsule 0  . prochlorperazine (COMPAZINE) 10 MG tablet Take 1 tablet (10 mg total) by mouth every 6 (six) hours as needed for nausea or vomiting. 30 tablet 0  . sucralfate (CARAFATE) 1 GM/10ML suspension Take 10 mLs (1 g total) by mouth 4 (four) times daily -  with meals and at bedtime. 420 mL 0   No current facility-administered medications for this visit.    REVIEW OF SYSTEMS:   Constitutional: Denies fevers, chills or abnormal night sweats. (+) fatigue  Eyes: Denies  blurriness of vision, double vision or watery eyes Ears, nose, mouth, throat, and face: Denies mucositis or sore throat Respiratory:cough and dyspnea has significantly improved after radiation, no wheezes Cardiovascular: (+) mid chest pain, Denies palpitation, or lower extremity swelling Gastrointestinal:  Denies nausea, heartburn or change in bowel habits Skin: Denies abnormal skin rashes Lymphatics: Denies new lymphadenopathy or easy bruising Neurological:Denies numbness, tingling or new weaknesses Behavioral/Psych: Mood is stable, no new changes  All other systems were reviewed with the patient and are negative.  PHYSICAL EXAMINATION: ECOG PERFORMANCE STATUS: 2 - Symptomatic, <50% confined to bed  Filed Vitals:   08/31/14 1356  BP: 110/71  Pulse: 102  Temp: 98.3 F (36.8 C)  Resp: 20   Filed Weights   08/31/14 1356    Weight: 96 lb 3.2 oz (43.636 kg)    GENERAL:alert, no distress and comfortable SKIN: skin color, texture, turgor are normal, no rashes or significant lesions EYES: normal, conjunctiva are pink and non-injected, sclera clear OROPHARYNX:no exudate, no erythema and lips, buccal mucosa, and tongue normal  NECK: supple, thyroid normal size, non-tender, without nodularity LYMPH:  no palpable lymphadenopathy in the cervical, axillary or inguinal LUNGS: clear to auscultation and percussion with normal breathing effort HEART: regular rate & rhythm and no murmurs and no lower extremity edema ABDOMEN:abdomen soft, non-tender and normal bowel sounds Musculoskeletal:no cyanosis of digits and no clubbing  PSYCH: alert & oriented x 3 with fluent speech NEURO: no focal motor/sensory deficits  LABORATORY DATA:  I have reviewed the data as listed CBC Latest Ref Rng 08/31/2014 08/25/2014 08/17/2014  WBC 3.9 - 10.3 10e3/uL 2.8(L) 6.5 6.3  Hemoglobin 11.6 - 15.9 g/dL 11.1(L) 11.6 11.0(L)  Hematocrit 34.8 - 46.6 % 33.8(L) 35.3 33.2(L)  Platelets 145 - 400 10e3/uL 270 445(H) 406(H)    CMP Latest Ref Rng 08/31/2014 08/25/2014 08/17/2014  Glucose 70 - 140 mg/dl 114 85 100  BUN 7.0 - 26.0 mg/dL 13.3 16.5 14.7  Creatinine 0.6 - 1.1 mg/dL 0.8 0.8 0.7  Sodium 136 - 145 mEq/L 139 139 138  Potassium 3.5 - 5.1 mEq/L 4.3 4.6 4.8  Chloride 96 - 112 mmol/L - - -  CO2 22 - 29 mEq/L _0 Calcium 8.4 - 10.4 mg/dL 9.3 9.4 9.4  Total Protein 6.4 - 8.3 g/dL 6.6 6.7 6.9  Total Bilirubin 0.20 - 1.20 mg/dL 0.23 0.32 0.23  Alkaline Phos 40 - 150 U/L 85 81 82  AST 5 - 34 U/L _1 ALT 0 - 55 U/L 6 <6 8       PATHOLOGY REPORT: Diagnosis 05/05/2014 1. Pericardium, biopsy - BENIGN MESOTHELIAL LINED FIBROADIPOSE TISSUE. 2. Endobronchial biopsy, right middle lobe - POORLY DIFFERENTIATED CARCINOMA - SEE COMMENT. 3. Endobronchial biopsy, right lower lobe - POORLY DIFFERENTIATED CARCINOMA. - SEE COMMENT. 1 of  2 FINAL for Patricia Carter, Patricia Carter ([id number]) Microscopic Comment 2. , 3. The malignant cells are positive for cytokeratin AE1/AE3 and estrogen receptor. They are negative for cytokeratin 7, cytokeratin 20, S-100, GCDFP, CD45, chromogranin, cytokeratin 5/6, synaptophysin, TTF-1. This immunohistochemical profile is not specific. However, given the patient's history, the positive staining for estrogen receptor is most suggestive of metastatic primary breast carcinoma. A HER-2 will be attempted and the results reportedly separately. (JBK:ecj 05/11/2014)  RADIOGRAPHIC STUDIES: I have personally reviewed the radiological images as listed and agreed with the findings in the report.  CT chest 05/01/2015  IMPRESSION: 1. Negative for pulmonary embolus. 2. Right hilar mass with compression of the SVC and  right upper lobe pulmonary artery and obstruction of the right middle lobe bronchus. Right middle lobe mass and postobstructive collapse. Findings are highly worrisome for primary bronchogenic carcinoma. 3. Possible satellite right lower lobe nodule. 4. Moderate pericardial effusion. 5. Scattered small nodular and ground-glass opacities in the right upper lobe, likely infectious or inflammatory in etiology.  CT chest w contrast 07/03/2014 IMPRESSION: Interval decrease in sizes of RIGHT upper lobe mass, RIGHT hilar adenopathy and mediastinal adenopathy.  RIGHT lower lobe pulmonary nodule decreased in size with a single RIGHT apex nodule minimally increased in size since previous study.   Electronically Signed   By: Lavonia Dana M.D.   On: 07/03/2014 10:48   CT abdomen and pelvis with contrast 08/14/2014 IMPRESSION: 1. No compelling findings of metastatic disease to the abdomen. 2. Wall thickening in the descending duodenum. Although potentially from The from peristalsis, duodenitis is not excluded. Endoscopy or upper GI fluoroscopic exam may be helpful in further characterization. 3. New ground-glass  opacities in the right lower lobe and right middle lobe suggesting alveolitis or pulmonary hemorrhage. 4. Atherosclerosis. 5. Lower lumbar degenerative disc disease.  Bone scan 08/14/2014 IMPRESSION: Solitary focus of abnormal uptake is seen involving the lateral portion of a left middle rib concerning for metastatic lesion.    ASSESSMENT & PLAN:  59 year old female with past medical history of stage IIB left breast cancer, status post mastectomy and axillary lymph node dissection in 2011. Now presented with metastatic disease to lungs and lymph nodes.  1. 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 She never smoked, and immunostain does not support primary lung cancer. -We discussed the natural history of her cancer, unfortunately this is incurable at this stage, but treatable and we do have many treatment options.  -She has had good response to palliative radiation with significant symptom improvement. -PET scan was denied by her insurance. Bone scan showed a possible left lateral rib metastasis. CT of the abdomen was negative for other metastasis. -Her lung metastasis having treated with radiation with good symptomatic and radiographic response -Giving no other significant visceral metastasis, and her recent side effects from radiation, I recommend letrozole and Ibrance as the first line systemic therapy.  -She has some fatigue, nausea, and intermittent headaches since she started letrozole and Ibrance, some of this could be related to her anxiety depression also.  -Her total WBC has dropped to 2.8 today, ANC 2.5. We'll continue I will continue therapy and follow her CBC in 2 weeks.  2. 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 -She has a mild dyspnea on exertion,  overall her symptoms has significant improved after radiation. -Restaging CT scan in March 2016 showed significant improvement of her lung lesion, right hilar and mediastinal adenopathy. -Repeat bronchoscopy if needed.  3. 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 -I'll obtain a bone density scan to see if she has osteoporosis -Giving the potential side effects on bone from letrozole, I'll give her Xgeva.   4. Anxiety and depression, coping and social support Her anxiety and depression seems better lately.  -I referred her her to social worker -Her daughter lives in [place], she is scheduled to have a second opinion at [facility] this Thursday. I'll send her medical records over. She will get a copy of her scan  images from our radiology Department today.   Plan: continue current therapy. I'll see her back in 2 weeks for follow-up. Bone density scan in the next month.    All questions were answered. The patient knows to call the clinic with any problems, questions or concerns. I spent 25 minutes counseling the patient face to face. The total time spent in the appointment was 30 minutes and more than 50% was on counseling.     Truitt Merle, MD 08/31/2014 2:30 PM

## 2014-09-14 ENCOUNTER — Other Ambulatory Visit: Payer: Self-pay | Admitting: Hematology

## 2014-09-14 ENCOUNTER — Other Ambulatory Visit: Payer: Self-pay | Admitting: *Deleted

## 2014-09-14 ENCOUNTER — Telehealth: Payer: Self-pay | Admitting: *Deleted

## 2014-09-14 DIAGNOSIS — C50912 Malignant neoplasm of unspecified site of left female breast: Secondary | ICD-10-CM

## 2014-09-14 MED ORDER — PALBOCICLIB 125 MG PO CAPS: 125.0000 mg | ORAL_CAPSULE | Freq: Every day | ORAL | Status: DC

## 2014-09-14 MED ORDER — ALPRAZOLAM 0.5 MG PO TABS: 0.5000 mg | ORAL_TABLET | Freq: Three times a day (TID) | ORAL | Status: DC | PRN

## 2014-09-14 MED ORDER — LETROZOLE 2.5 MG PO TABS: 2.5000 mg | ORAL_TABLET | Freq: Every day | ORAL | Status: DC

## 2014-09-14 NOTE — Telephone Encounter (Signed)
Script faxed to [facility] for Ibrance per pt request.

## 2014-09-14 NOTE — Telephone Encounter (Signed)
PT. REQUESTED TO HAVE THE [facility] TO [facility] [place]. SHE WOULD LIKE TO RECEIVE THE IBRANCE TO HER HOME IF IT IS APPROVE BY THE [facility]. COULD DR.FENG PRINT AND SIGN THE IBRANCE PRESCRIPTION AND IT NEEDS TO BE FAXED TO [facility]  AT [phone]. [facility] WILL FAX THE [facility] PRESCRIPTION TO THE [facility].

## 2014-09-16 ENCOUNTER — Telehealth: Payer: Self-pay | Admitting: *Deleted

## 2014-09-16 NOTE — Telephone Encounter (Signed)
VM message from patient stating that she is not feeling well, after taking her Letrozole-it makes her arms and legs tingly, aching muscles. She wanted to know how soon after she takes the letrozole, can she take a xanax.  Requesting a call back.  She does have an appt with Dr. Burr Medico on Friday, 09/18/14  Call back to patient-Left vm that it is ok to take Xanax.

## 2014-09-18 ENCOUNTER — Other Ambulatory Visit: Payer: Self-pay | Admitting: *Deleted

## 2014-09-18 ENCOUNTER — Telehealth: Payer: Self-pay | Admitting: Hematology

## 2014-09-18 ENCOUNTER — Other Ambulatory Visit (HOSPITAL_BASED_OUTPATIENT_CLINIC_OR_DEPARTMENT_OTHER): Payer: Medicaid Other

## 2014-09-18 ENCOUNTER — Ambulatory Visit (HOSPITAL_BASED_OUTPATIENT_CLINIC_OR_DEPARTMENT_OTHER): Payer: Medicaid Other | Admitting: Hematology

## 2014-09-18 ENCOUNTER — Encounter: Payer: Self-pay | Admitting: Hematology

## 2014-09-18 VITALS — BP 118/66 | HR 96 | Temp 98.6°F | Resp 18 | Ht 63.0 in | Wt 98.2 lb

## 2014-09-18 DIAGNOSIS — C7801 Secondary malignant neoplasm of right lung: Secondary | ICD-10-CM | POA: Diagnosis not present

## 2014-09-18 DIAGNOSIS — C7800 Secondary malignant neoplasm of unspecified lung: Principal | ICD-10-CM

## 2014-09-18 DIAGNOSIS — C50912 Malignant neoplasm of unspecified site of left female breast: Secondary | ICD-10-CM | POA: Diagnosis not present

## 2014-09-18 DIAGNOSIS — F419 Anxiety disorder, unspecified: Secondary | ICD-10-CM | POA: Diagnosis not present

## 2014-09-18 DIAGNOSIS — C773 Secondary and unspecified malignant neoplasm of axilla and upper limb lymph nodes: Secondary | ICD-10-CM | POA: Diagnosis not present

## 2014-09-18 DIAGNOSIS — C50919 Malignant neoplasm of unspecified site of unspecified female breast: Secondary | ICD-10-CM

## 2014-09-18 DIAGNOSIS — C7951 Secondary malignant neoplasm of bone: Secondary | ICD-10-CM

## 2014-09-18 LAB — CBC WITH DIFFERENTIAL/PLATELET
BASO%: 1.7 % (ref 0.0–2.0)
BASOS ABS: 0 10*3/uL (ref 0.0–0.1)
EOS%: 1.3 % (ref 0.0–7.0)
Eosinophils Absolute: 0 10*3/uL (ref 0.0–0.5)
HEMATOCRIT: 33 % — AB (ref 34.8–46.6)
HEMOGLOBIN: 11.1 g/dL — AB (ref 11.6–15.9)
LYMPH%: 12.7 % — ABNORMAL LOW (ref 14.0–49.7)
MCH: 29.3 pg (ref 25.1–34.0)
MCHC: 33.6 g/dL (ref 31.5–36.0)
MCV: 87.1 fL (ref 79.5–101.0)
MONO#: 0.5 10*3/uL (ref 0.1–0.9)
MONO%: 20.8 % — AB (ref 0.0–14.0)
NEUT#: 1.5 10*3/uL (ref 1.5–6.5)
NEUT%: 63.5 % (ref 38.4–76.8)
Platelets: 213 10*3/uL (ref 145–400)
RBC: 3.79 10*6/uL (ref 3.70–5.45)
RDW: 18.5 % — ABNORMAL HIGH (ref 11.2–14.5)
WBC: 2.4 10*3/uL — ABNORMAL LOW (ref 3.9–10.3)
lymph#: 0.3 10*3/uL — ABNORMAL LOW (ref 0.9–3.3)

## 2014-09-18 NOTE — Progress Notes (Signed)
[place]  [phone]) [phone] [phone]) [phone]  Clinic New Consult Note   Patient Care Team: No Pcp Per Patient as PCP - General (General Practice) [facility] 09/18/2014  CHIEF COMPLAINTS/PURPOSE OF CONSULTATION:  Recurrent breast cancer    Recurrent cancer of left breast   12/23/2009 Surgery left mastectomy with axillary node dissection. Deep surgical margin were positive, pT2N1, tumor size 3.7 x 3.0 x 2.2 cm, grade 3, 2 out of 10 lymph nodes were positive for tumor cells. Patient declined adjuvant chemotherapy, radiation and endocrin therapy   04/30/2014 Progression She presented with worsening cough and dyspnea. CT on 05/01/2015 showed right hilar mass with compression of the SVC and right upper lobe pulmonary artery and obstruction of the right middle lobe bronchus. Moderate pericardial effusion   05/05/2014 Pathology Results Endobronchial biopsy of right lower lobe showed poorly differentiated adenocarcinoma, estrogen receptor positive, CK AE1/AE3 positive. Negative for CK 7, CK 20, TTF-1, S100,etc.  HER2(-). Pericardium biopsy negative.   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   08/17/2014 -  Anti-estrogen oral therapy Letrozole 31m daily and palbociclib 1249mdaily 3 weeks on 1 week off, starting 08/18/2014     HISTORY OF PRESENTING ILLNESS:  Patricia Lafountain59o. female is here because of recently diagnosed metastatic recurrent breast cancer.  She was diagnosed with pT2 N1M0, stage IIB, left breast cancer in August 2011. [facility], and declined adjuvant chemotherapy and endocrine therapy. She has not followed with physician  regularly after that and does not have annual mammogram.  She started having dry cough and dyspnea on exertion in May 2016. She had multiple ED visit since then and was treated with multiple courses of antibiotics for bronchitis, which really did not help. In December 2015, she called the bowel pulmonary clinic and was evaluated for worsening cough and dyspnea. A CT of chest was obtained which showed a right hilar mass which cause compression on SVC and right upper lobe pulmonary artery and obstruction of right middle lobe. Moderate pericardial effusion was noticed. She underwent bronchoscopy and pericardial window on 05/01/2014. Biopsy of the right endobronchial lesion showed poorly differentiated adenoma, consistent with breast primary. Pericardial window biopsy and cytology was negative for malignancy.  She started palliative radiation on 06/10/2014, and completed on 06/29/2014, a total of 14 sessions. She tolerated well initially, but developed severe substernal chest pain and dysphagia at the last week of radiation. She still has quite a bit chest pain, is taking Bumex and oxycodone, which helps only some. She is able to eat soft food diet and adequate. She lost about 7 pounds during the radiation. Her dyspnea and cough has significantly improved after radiation.  INTERIM HISTORY: She returns for follow up. She finished the first cycle 3 weeks of Ibrance 3 days ago. She did have multiple mild symptoms when she was on letrozole and ibrance, such as fatigue, muscular and joint achiness, mild lightheadedness, intermittent left arm numbness. However she developed much worse symptoms since 3 days ago when she finished Ibrance, including moderate dizziness after taking letrozole, episodic nausea and vomiting, worsening fatigue, anxiety, poor appetite worsening joint pain, etc. She became very anxious and nervous about taking letrozole, but did continue  and she had to take Before letrozole. She denies any  fever, did have mild nose bleeding a few times in the past few weeks. She is afraid of continuing taking the letrozole. She was elected to try medical marijuana. When I saw her in office, she appears very nervous, but denies any significant pain, paresthesia, or dyspnea.  MEDICAL HISTORY:  Past Medical History  Diagnosis Date  . Breast cancer   . Breast cancer     SURGICAL HISTORY: Past Surgical History  Procedure Laterality Date  . Mastectomy Left     2010  . Eye surgery      cataract surgery, wears contact left eye  . Subxyphoid pericardial window N/A 05/05/2014    Procedure: SUBXYPHOID PERICARDIAL WINDOW;  Surgeon: Gaye Pollack, MD;  Location: [facility] OR;  Service: Thoracic;  Laterality: N/A;  . Flexible bronchoscopy N/A 05/05/2014    Procedure: FLEXIBLE BRONCHOSCOPY;  Surgeon: Gaye Pollack, MD;  Location: [place];  Service: Thoracic;  Laterality: N/A;  . Video bronchoscopy with endobronchial ultrasound N/A 05/05/2014    Procedure: VIDEO BRONCHOSCOPY WITH ENDOBRONCHIAL ULTRASOUND;  Surgeon: Gaye Pollack, MD;  Location: [place];  Service: Thoracic;  Laterality: N/A;    SOCIAL HISTORY: History   Social History  . Marital Status: Single    Spouse Name: N/A  . Number of Children: N/A  . Years of Education: N/A   Occupational History  . Works PT reception    Social History Main Topics  . Smoking status: Never Smoker   . Smokeless tobacco: Never Used  . Alcohol Use: 0.0 oz/week    0 Standard drinks or equivalent per week     Comment: occ  . Drug Use: No  . Sexual Activity: Not Currently   Other Topics Concern  . Not on file   Social History Narrative    FAMILY HISTORY: Family History  Problem Relation Age of Onset  . Cancer Paternal Aunt     leukemia  . Cancer Father 70    prostate cancer     ALLERGIES:  is allergic to prednisone and zyrtec.  MEDICATIONS:  Current Outpatient Prescriptions  Medication Sig Dispense Refill  . ALPRAZolam (XANAX) 0.5 MG tablet Take 1  tablet (0.5 mg total) by mouth 3 (three) times daily as needed for anxiety. Take 1 tablet every 8 hours prn anxiety 30 tablet 0  . Alum & Mag Hydroxide-Simeth (GI COCKTAIL) SUSP suspension Take 10 mLs by mouth 2 (two) times daily as needed for indigestion. Shake well. 120 mL 0  . hyaluronate sodium (RADIAPLEXRX) GEL Apply 1 application topically once.    Marland Kitchen letrozole (FEMARA) 2.5 MG tablet Take 1 tablet (2.5 mg total) by mouth daily. 30 tablet 5  . Naproxen Sodium (ALEVE PO) Take 1 tablet by mouth 2 (two) times daily as needed (headache).     . oxyCODONE (OXY IR/ROXICODONE) 5 MG immediate release tablet Take 1-2 tablets (5-10 mg total) by mouth every 4 (four) hours as needed for severe pain. 30 tablet 0  . palbociclib (IBRANCE) 125 MG capsule Take 1 capsule (125 mg total) by mouth daily with breakfast. Take whole with food. Take daily x 21 days then 1 week break. 21 capsule 3  . prochlorperazine (COMPAZINE) 10 MG tablet Take 1 tablet (10 mg total) by mouth every 6 (six) hours as needed for nausea or vomiting. (Patient not taking: Reported on 08/31/2014) 30 tablet 0  . sucralfate (CARAFATE) 1 GM/10ML suspension Take 10 mLs (1 g total) by  mouth 4 (four) times daily -  with meals and at bedtime. 420 mL 0   No current facility-administered medications for this visit.    REVIEW OF SYSTEMS:   Constitutional: Denies fevers, chills or abnormal night sweats. (+) fatigue  Eyes: Denies blurriness of vision, double vision or watery eyes Ears, nose, mouth, throat, and face: Denies mucositis or sore throat Respiratory:cough and dyspnea has significantly improved after radiation, no wheezes Cardiovascular: (+) mid chest pain, Denies palpitation, or lower extremity swelling Gastrointestinal:  Denies nausea, heartburn or change in bowel habits Skin: Denies abnormal skin rashes Lymphatics: Denies new lymphadenopathy or easy bruising Neurological:Denies numbness, tingling or new weaknesses Behavioral/Psych: Mood is  stable, no new changes  All other systems were reviewed with the patient and are negative.  PHYSICAL EXAMINATION: ECOG PERFORMANCE STATUS: 2 - Symptomatic, <50% confined to bed  Filed Vitals:   09/18/14 1418  BP: 118/66  Pulse: 96  Temp: 98.6 F (37 C)  Resp: 18   Filed Weights   09/18/14 1418  Weight: 98 lb 3.2 oz (44.543 kg)    GENERAL:alert, no distress and comfortable SKIN: skin color, texture, turgor are normal, no rashes or significant lesions EYES: normal, conjunctiva are pink and non-injected, sclera clear OROPHARYNX:no exudate, no erythema and lips, buccal mucosa, and tongue normal  NECK: supple, thyroid normal size, non-tender, without nodularity LYMPH:  no palpable lymphadenopathy in the cervical, axillary or inguinal LUNGS: clear to auscultation and percussion with normal breathing effort HEART: regular rate & rhythm and no murmurs and no lower extremity edema ABDOMEN:abdomen soft, non-tender and normal bowel sounds Musculoskeletal:no cyanosis of digits and no clubbing  PSYCH: alert & oriented x 3 with fluent speech NEURO: no focal motor/sensory deficits  LABORATORY DATA:  I have reviewed the data as listed CBC Latest Ref Rng 09/18/2014 08/31/2014 08/25/2014  WBC 3.9 - 10.3 10e3/uL 2.4(L) 2.8(L) 6.5  Hemoglobin 11.6 - 15.9 g/dL 11.1(L) 11.1(L) 11.6  Hematocrit 34.8 - 46.6 % 33.0(L) 33.8(L) 35.3  Platelets 145 - 400 10e3/uL 213 270 445(H)    CMP Latest Ref Rng 08/31/2014 08/25/2014 08/17/2014  Glucose 70 - 140 mg/dl 114 85 100  BUN 7.0 - 26.0 mg/dL 13.3 16.5 14.7  Creatinine 0.6 - 1.1 mg/dL 0.8 0.8 0.7  Sodium 136 - 145 mEq/L 139 139 138  Potassium 3.5 - 5.1 mEq/L 4.3 4.6 4.8  Chloride 96 - 112 mmol/L - - -  CO2 22 - 29 mEq/L 28 23 24   Calcium 8.4 - 10.4 mg/dL 9.3 9.4 9.4  Total Protein 6.4 - 8.3 g/dL 6.6 6.7 6.9  Total Bilirubin 0.20 - 1.20 mg/dL 0.23 0.32 0.23  Alkaline Phos 40 - 150 U/L 85 81 82  AST 5 - 34 U/L 12 14 15   ALT 0 - 55 U/L 6 <6 8        PATHOLOGY REPORT: Diagnosis 05/05/2014 1. Pericardium, biopsy - BENIGN MESOTHELIAL LINED FIBROADIPOSE TISSUE. 2. Endobronchial biopsy, right middle lobe - POORLY DIFFERENTIATED CARCINOMA - SEE COMMENT. 3. Endobronchial biopsy, right lower lobe - POORLY DIFFERENTIATED CARCINOMA. - SEE COMMENT. 1 of 2 FINAL for STASSI, FADELY ([id number]) Microscopic Comment 2. , 3. The malignant cells are positive for cytokeratin AE1/AE3 and estrogen receptor. They are negative for cytokeratin 7, cytokeratin 20, S-100, GCDFP, CD45, chromogranin, cytokeratin 5/6, synaptophysin, TTF-1. This immunohistochemical profile is not specific. However, given the patient's history, the positive staining for estrogen receptor is most suggestive of metastatic primary breast carcinoma. A HER-2 will be attempted and the results reportedly  separately. (JBK:ecj 05/11/2014)  RADIOGRAPHIC STUDIES: I have personally reviewed the radiological images as listed and agreed with the findings in the report.  CT chest 05/01/2015  IMPRESSION: 1. Negative for pulmonary embolus. 2. Right hilar mass with compression of the SVC and right upper lobe pulmonary artery and obstruction of the right middle lobe bronchus. Right middle lobe mass and postobstructive collapse. Findings are highly worrisome for primary bronchogenic carcinoma. 3. Possible satellite right lower lobe nodule. 4. Moderate pericardial effusion. 5. Scattered small nodular and ground-glass opacities in the right upper lobe, likely infectious or inflammatory in etiology.  CT chest w contrast 07/03/2014 IMPRESSION: Interval decrease in sizes of RIGHT upper lobe mass, RIGHT hilar adenopathy and mediastinal adenopathy.  RIGHT lower lobe pulmonary nodule decreased in size with a single RIGHT apex nodule minimally increased in size since previous study.   Electronically Signed   By: Lavonia Dana M.D.   On: 07/03/2014 10:48   CT abdomen and pelvis with contrast  08/14/2014 IMPRESSION: 1. No compelling findings of metastatic disease to the abdomen. 2. Wall thickening in the descending duodenum. Although potentially from The from peristalsis, duodenitis is not excluded. Endoscopy or upper GI fluoroscopic exam may be helpful in further characterization. 3. New ground-glass opacities in the right lower lobe and right middle lobe suggesting alveolitis or pulmonary hemorrhage. 4. Atherosclerosis. 5. Lower lumbar degenerative disc disease.  Bone scan 08/14/2014 IMPRESSION: Solitary focus of abnormal uptake is seen involving the lateral portion of a left middle rib concerning for metastatic lesion.    ASSESSMENT & PLAN:  59 year old female with past medical history of stage IIB left breast cancer, status post mastectomy and axillary lymph node dissection in 2011. Now presented with metastatic disease to lungs and lymph nodes.  1. 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 She never smoked, and immunostain does not support primary lung cancer. -We discussed the natural history of her cancer, unfortunately this is incurable at this stage, but treatable and we do have many treatment options.  -She has had good response to palliative radiation with significant symptom improvement. -PET scan was denied by her insurance. Bone scan showed a possible left lateral rib metastasis. CT of the abdomen was negative for other metastasis. -Her lung metastasis having treated with radiation with good symptomatic and radiographic response -Giving no other significant visceral metastasis, and her recent side effects from radiation, I recommend letrozole and Ibrance as the first line systemic therapy.  -She tolerated the first cycle letrozole and Ibrance poorly, although some of  the symptoms could be related to her anxiety and depression. I recommend her to stop taking letrozole for 5 days, and restart next Wednesday and changed to evening time. -I'll hold Ibrance for second cycle and consider restart with lower dose with this cycle if she tolerates letrozole alone well. - she is slightly neutropenic, ANC 1.5 today, infection precautions were discussed with patient   2. dizziness, anorexia, intermittent nausea -Some are related to her treatment, some could be related to her anxiety and depression -We discussed the benefit of Marinol, and I think it's reasonable to give a try. She is more interested in natural medical marijuana, which is illegal in [place]. -She will call us if she would like to try Marinol, we'll start with low-dose.  2. Endobronchial metastasis from breast cancer -Status post  radiation. -She has a mild dyspnea on exertion, overall her symptoms has significant improved after radiation. -Restaging CT scan in March 2016 showed significant improvement of her lung lesion, right hilar and mediastinal adenopathy. -Repeat bronchoscopy if needed.  3. 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 -I'll obtain a bone density scan to see if she has osteoporosis -Giving the potential side effects on bone from letrozole, I'll give her Delton See if her bone density scan shows osteopenia or osteoporosis   4. Anxiety and depression, coping and social support -Her anxiety and depression got worse due to the side effect from medical treatment  -Continue as xanax as needed. She declined antidepressant medication  -Emotional support and encouragement  Plan: -Hold letrozole for 5 days. If she feels better, she will restart next Wednesday, May 25, in evening.  -hold Leslee Home for second cycle  -Return to clinic in 3 weeks   All questions were answered. The patient knows to call the clinic with any problems,  questions or concerns. I spent 35 minutes counseling the patient face to face. The total time spent in the appointment was 40 minutes and more than 50% was on counseling.     Truitt Merle, MD 09/18/2014 2:35 PM

## 2014-09-18 NOTE — Telephone Encounter (Signed)
Gave and printed appt sched and avs for pt for June °

## 2014-09-21 ENCOUNTER — Other Ambulatory Visit: Payer: Self-pay | Admitting: *Deleted

## 2014-09-21 ENCOUNTER — Telehealth: Payer: Self-pay | Admitting: *Deleted

## 2014-09-21 DIAGNOSIS — R630 Anorexia: Secondary | ICD-10-CM

## 2014-09-21 MED ORDER — DRONABINOL 2.5 MG PO CAPS: 2.5000 mg | ORAL_CAPSULE | Freq: Two times a day (BID) | ORAL | Status: DC

## 2014-09-21 NOTE — Telephone Encounter (Signed)
TC from patient requesting to try Marinol as discussed with Dr. Burr Medico at last appt 09/18/14. She would like to have prescription mailed to her if possible.

## 2014-09-22 ENCOUNTER — Telehealth: Payer: Self-pay | Admitting: *Deleted

## 2014-09-22 NOTE — Telephone Encounter (Signed)
Left message with Melissa at [facility] she called back & states there is a support group for met breast CA pts that meet on Thurs & that she would be happy to speak to [place] about this pt & see if she can match her with someone.

## 2014-09-22 NOTE — Telephone Encounter (Signed)
Message left with Sigmund Hazel, Breast Navigator requesting help with pt that is very nervous & to see if she has some suggestions.  Perhaps someone from [facility] who had a similar situation could talk with her to help her out.

## 2014-09-22 NOTE — Telephone Encounter (Signed)
Called pt to discuss sending marinol script in the mail & informed that I was a little uncomfortable with this but pharmacy states that this is OK.  Requested that if pt didn't receive in 1 wk to let me know.  She states that she would prefer that I not mail the script.  She expressed that she was glad that I called & wants Dr Burr Medico to know that she is very nervous about taking the letrozole again & she is very upset.  She took a xanax this am. She wants to know if Dr Burr Medico has other options for her.  Will discuss with Dr Burr Medico.

## 2014-09-23 NOTE — Telephone Encounter (Signed)
See Notes

## 2014-09-23 NOTE — Telephone Encounter (Signed)
Talked with pt & informed that I spoke with Iris Pert RN/Breast Navigator & then to [facility] at [place] informed of support group & possibly matching her up with a support person who has been through a similar situation.  She states that she couldn't come to the support group b/c she is not driving & would be willing to talk to someone but if she doesn't recognize the ph #, she won't answer & they may need to leave a message & she will call them back.  Message left with Melissa/[facility] to see if she can make this happen.  Pt states she is trying to be positive & plans to take the letrozole with her dinner tonight & then take her xanax @ hs.

## 2014-09-24 ENCOUNTER — Other Ambulatory Visit: Payer: Self-pay | Admitting: Hematology

## 2014-09-24 ENCOUNTER — Telehealth: Payer: Self-pay | Admitting: *Deleted

## 2014-09-24 DIAGNOSIS — C7800 Secondary malignant neoplasm of unspecified lung: Principal | ICD-10-CM

## 2014-09-24 DIAGNOSIS — C50919 Malignant neoplasm of unspecified site of unspecified female breast: Secondary | ICD-10-CM

## 2014-09-24 MED ORDER — ANASTROZOLE 1 MG PO TABS: 1.0000 mg | ORAL_TABLET | Freq: Every day | ORAL | Status: DC

## 2014-09-24 NOTE — Telephone Encounter (Signed)
VM message this am from pt. She states she tried taking letrozole with evening meal but states it was no good that way. She states she was awake all night. She also has questions regarding when to start her Ibrance.  Pt. Is requesting Dr. Burr Medico to call her back.

## 2014-09-24 NOTE — Telephone Encounter (Signed)
I called her back. I recommend her switch letrozole to anastrozole, and will not start Ibrance until she takes anastrozole for at least 3 weeks and does well with it. Prescription was called in today.  Truitt Merle  09/24/2014

## 2014-09-30 ENCOUNTER — Other Ambulatory Visit: Payer: Self-pay | Admitting: Hematology

## 2014-09-30 ENCOUNTER — Telehealth: Payer: Self-pay | Admitting: *Deleted

## 2014-09-30 DIAGNOSIS — C7800 Secondary malignant neoplasm of unspecified lung: Principal | ICD-10-CM

## 2014-09-30 DIAGNOSIS — C50919 Malignant neoplasm of unspecified site of unspecified female breast: Secondary | ICD-10-CM

## 2014-09-30 MED ORDER — EXEMESTANE 25 MG PO TABS: 25.0000 mg | ORAL_TABLET | Freq: Every day | ORAL | Status: DC

## 2014-09-30 NOTE — Telephone Encounter (Addendum)
"  Please get a message to Dr. Burr Medico that I stopped the anastrozole.  I felt worse with this medicine than the letrozole.  Took one dose 09-26-2014 at 0730 am and by early evening experienced terrible side effects.  Dizzy, headache, itching, nausea, both arms, hands, fingers and legs tingling, spotting light brown spotting with vaginal itching and stayed up with constant movement.  I have an appointmemt 10-09-2014 and will discuss this then or she can call if she wants to see me sooner.  Today the symptoms are mostly gone and I feel good with only a slight tingle in finger and thumb"

## 2014-09-30 NOTE — Telephone Encounter (Signed)
I called back and left a message: I would like her to try exemestane (Aromasin), I'll send a prescription of 10 day supply to her pharmacy. If she is not willing to try, I'll see her on 10/09/2014 to discuss other options.  Patricia Carter  09/30/2014

## 2014-09-30 NOTE — Telephone Encounter (Signed)
error 

## 2014-10-01 ENCOUNTER — Telehealth: Payer: Self-pay | Admitting: *Deleted

## 2014-10-01 NOTE — Telephone Encounter (Signed)
Patient returned call and spoke with a triage nurse.  Does not wish to try aromasin.  Will meet with Dr. Burr Medico on 10-09-2014 to discuss all options after collaborating with family.

## 2014-10-01 NOTE — Telephone Encounter (Signed)
10-01-2014 at 1530 called patient leaving message to call [facility] to let Triage know if she desires to try aromasin.  Awaiting return call from patient.  RWS, RN

## 2014-10-01 NOTE — Telephone Encounter (Signed)
PT.'S FAMILY HAS COME FROM [place] FOR [place] TO WAIT TO START THE AROMASIN UNTIL PT. SEES DR.FENG ON 10/09/14.

## 2014-10-02 ENCOUNTER — Other Ambulatory Visit: Payer: Self-pay | Admitting: Hematology

## 2014-10-02 DIAGNOSIS — C7800 Secondary malignant neoplasm of unspecified lung: Principal | ICD-10-CM

## 2014-10-02 DIAGNOSIS — C50912 Malignant neoplasm of unspecified site of left female breast: Secondary | ICD-10-CM

## 2014-10-02 DIAGNOSIS — C50919 Malignant neoplasm of unspecified site of unspecified female breast: Secondary | ICD-10-CM

## 2014-10-02 MED ORDER — ALPRAZOLAM 0.5 MG PO TABS: 0.5000 mg | ORAL_TABLET | Freq: Three times a day (TID) | ORAL | Status: DC | PRN

## 2014-10-05 ENCOUNTER — Other Ambulatory Visit: Payer: Self-pay | Admitting: Hematology

## 2014-10-09 ENCOUNTER — Encounter: Payer: Medicaid Other | Admitting: Hematology

## 2014-10-09 ENCOUNTER — Other Ambulatory Visit: Payer: Medicaid Other

## 2014-10-09 NOTE — Progress Notes (Signed)
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 Deep surgical margin were positive, pT2N1, tumor size 3.7 x 3.0 x 2.2 cm, grade 3, 2 out of 10 lymph nodes were positive for tumor cells. Patient declined adjuvant chemotherapy, radiation and endocrin therapy   04/30/2014 Progression She presented with worsening cough and dyspnea. CT on 05/01/2015 showed right hilar mass with compression of the SVC and right upper lobe pulmonary artery and obstruction of the right middle lobe bronchus. Moderate pericardial effusion   05/05/2014 Pathology Results Endobronchial biopsy of right lower lobe showed poorly differentiated adenocarcinoma, estrogen receptor positive, CK AE1/AE3 positive. Negative for CK 7, CK 20, TTF-1, S100,etc.  HER2(-). Pericardium biopsy negative.   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 CT abdomen and pelvis is negative for mets.    08/17/2014 -  Anti-estrogen oral therapy Letrozole 38m daily and palbociclib 123mdaily 3 weeks on 1 week off, starting 08/18/2014, palbo was held for second cycle due to AEs (dizziness, fatigue and nose bleeding).      HISTORY OF PRESENTING ILLNESS:  Patricia Bufano78.o. female is here because of recently diagnosed metastatic recurrent breast cancer.  She was diagnosed with pT2 N1M0, stage IIB, left breast cancer in August 2011. She [facility] surgery, and declined adjuvant chemotherapy and endocrine therapy. She has not followed with physician regularly after that and does not have annual mammogram.  She started having dry cough and dyspnea on exertion in May 2016. She had multiple ED visit since then and was treated with multiple courses of antibiotics for bronchitis, which really did not help. In December 2015, she called the bowel pulmonary clinic and was evaluated for worsening cough and dyspnea. A CT of chest was obtained which showed a right hilar mass which cause compression on SVC and right upper lobe pulmonary artery and obstruction of right middle lobe. Moderate pericardial effusion was noticed. She underwent bronchoscopy and pericardial window on 05/01/2014. Biopsy of the right endobronchial lesion showed poorly differentiated adenoma, consistent with breast primary. Pericardial window biopsy and cytology was negative for malignancy.  She started palliative radiation on 06/10/2014, and completed on 06/29/2014, a total of 14 sessions. She tolerated well initially, but developed severe substernal chest pain and dysphagia at the last week of radiation. She still has quite a bit chest pain, is taking Bumex and oxycodone, which helps only some. She is able to eat soft food diet and adequate. She lost about 7 pounds during the radiation. Her dyspnea and cough has significantly improved after radiation.  INTERIM HISTORY: She returns for follow up. She finished the first cycle 3 weeks of Ibrance 3 days ago. She did have multiple mild symptoms when she was on letrozole and ibrance, such as fatigue, muscular and joint achiness, mild lightheadedness, intermittent left arm numbness. However she developed much worse symptoms since  3 days ago when she finished Ibrance, including moderate dizziness after taking letrozole, episodic nausea and  vomiting, worsening fatigue, anxiety, poor appetite worsening joint pain, etc. She became very anxious and nervous about taking letrozole, but did continue and she had to take Before letrozole. She denies any fever, did have mild nose bleeding a few times in the past few weeks. She is afraid of continuing taking the letrozole. She was elected to try medical marijuana. When I saw her in office, she appears very nervous, but denies any significant pain, paresthesia, or dyspnea.  MEDICAL HISTORY:  Past Medical History  Diagnosis Date  . Breast cancer   . Breast cancer     SURGICAL HISTORY: Past Surgical History  Procedure Laterality Date  . Mastectomy Left     2010  . Eye surgery      cataract surgery, wears contact left eye  . Subxyphoid pericardial window N/A 05/05/2014    Procedure: SUBXYPHOID PERICARDIAL WINDOW;  Surgeon: Gaye Pollack, MD;  Location: [facility] OR;  Service: Thoracic;  Laterality: N/A;  . Flexible bronchoscopy N/A 05/05/2014    Procedure: FLEXIBLE BRONCHOSCOPY;  Surgeon: Gaye Pollack, MD;  Location: [place];  Service: Thoracic;  Laterality: N/A;  . Video bronchoscopy with endobronchial ultrasound N/A 05/05/2014    Procedure: VIDEO BRONCHOSCOPY WITH ENDOBRONCHIAL ULTRASOUND;  Surgeon: Gaye Pollack, MD;  Location: [place];  Service: Thoracic;  Laterality: N/A;    SOCIAL HISTORY: History   Social History  . Marital Status: Single    Spouse Name: N/A  . Number of Children: N/A  . Years of Education: N/A   Occupational History  . Works PT reception    Social History Main Topics  . Smoking status: Never Smoker   . Smokeless tobacco: Never Used  . Alcohol Use: 0.0 oz/week    0 Standard drinks or equivalent per week     Comment: occ  . Drug Use: No  . Sexual Activity: Not Currently   Other Topics Concern  . Not on file   Social History Narrative    FAMILY HISTORY: Family History  Problem Relation Age of Onset  . Cancer Paternal Aunt     leukemia  . Cancer Father  52    prostate cancer     ALLERGIES:  is allergic to iopamidol; contrast media ; prednisone; and zyrtec.  MEDICATIONS:  Current Outpatient Prescriptions  Medication Sig Dispense Refill  . ALPRAZolam (XANAX) 0.5 MG tablet Take 1 tablet (0.5 mg total) by mouth 3 (three) times daily as needed for anxiety. Take 1 tablet every 8 hours prn anxiety 30 tablet 0  . anastrozole (ARIMIDEX) 1 MG tablet Take 1 tablet (1 mg total) by mouth daily. 30 tablet 0  . dronabinol (MARINOL) 2.5 MG capsule Take 1 capsule (2.5 mg total) by mouth 2 (two) times daily before a meal. 30 capsule 0  . exemestane (AROMASIN) 25 MG tablet Take 1 tablet (25 mg total) by mouth daily after breakfast. 10 tablet 0  . Naproxen Sodium (ALEVE PO) Take 1 tablet by mouth 2 (two) times daily as needed (headache).     . oxyCODONE (OXY IR/ROXICODONE) 5 MG immediate release tablet Take 1-2 tablets (5-10 mg total) by mouth every 4 (four) hours as needed for severe pain. 30 tablet 0  . palbociclib (IBRANCE) 125 MG capsule Take 1 capsule (125 mg total) by mouth daily with breakfast. Take whole with food. Take daily x 21 days then 1 week break. 21 capsule 3  .  prochlorperazine (COMPAZINE) 10 MG tablet Take 1 tablet (10 mg total) by mouth every 6 (six) hours as needed for nausea or vomiting. (Patient not taking: Reported on 08/31/2014) 30 tablet 0   No current facility-administered medications for this visit.    REVIEW OF SYSTEMS:   Constitutional: Denies fevers, chills or abnormal night sweats. (+) fatigue  Eyes: Denies blurriness of vision, double vision or watery eyes Ears, nose, mouth, throat, and face: Denies mucositis or sore throat Respiratory:cough and dyspnea has significantly improved after radiation, no wheezes Cardiovascular: (+) mid chest pain, Denies palpitation, or lower extremity swelling Gastrointestinal:  Denies nausea, heartburn or change in bowel habits Skin: Denies abnormal skin rashes Lymphatics: Denies new  lymphadenopathy or easy bruising Neurological:Denies numbness, tingling or new weaknesses Behavioral/Psych: Mood is stable, no new changes  All other systems were reviewed with the patient and are negative.  PHYSICAL EXAMINATION: ECOG PERFORMANCE STATUS: 2 - Symptomatic, <50% confined to bed  There were no vitals filed for this visit. There were no vitals filed for this visit.  GENERAL:alert, no distress and comfortable SKIN: skin color, texture, turgor are normal, no rashes or significant lesions EYES: normal, conjunctiva are pink and non-injected, sclera clear OROPHARYNX:no exudate, no erythema and lips, buccal mucosa, and tongue normal  NECK: supple, thyroid normal size, non-tender, without nodularity LYMPH:  no palpable lymphadenopathy in the cervical, axillary or inguinal LUNGS: clear to auscultation and percussion with normal breathing effort HEART: regular rate & rhythm and no murmurs and no lower extremity edema ABDOMEN:abdomen soft, non-tender and normal bowel sounds Musculoskeletal:no cyanosis of digits and no clubbing  PSYCH: alert & oriented x 3 with fluent speech NEURO: no focal motor/sensory deficits  LABORATORY DATA:  I have reviewed the data as listed CBC Latest Ref Rng 09/18/2014 08/31/2014 08/25/2014  WBC 3.9 - 10.3 10e3/uL 2.4(L) 2.8(L) 6.5  Hemoglobin 11.6 - 15.9 g/dL 11.1(L) 11.1(L) 11.6  Hematocrit 34.8 - 46.6 % 33.0(L) 33.8(L) 35.3  Platelets 145 - 400 10e3/uL 213 270 445(H)    CMP Latest Ref Rng 08/31/2014 08/25/2014 08/17/2014  Glucose 70 - 140 mg/dl 114 85 100  BUN 7.0 - 26.0 mg/dL 13.3 16.5 14.7  Creatinine 0.6 - 1.1 mg/dL 0.8 0.8 0.7  Sodium 136 - 145 mEq/L 139 139 138  Potassium 3.5 - 5.1 mEq/L 4.3 4.6 4.8  Chloride 96 - 112 mmol/L - - -  CO2 22 - 29 mEq/L 28 23 24   Calcium 8.4 - 10.4 mg/dL 9.3 9.4 9.4  Total Protein 6.4 - 8.3 g/dL 6.6 6.7 6.9  Total Bilirubin 0.20 - 1.20 mg/dL 0.23 0.32 0.23  Alkaline Phos 40 - 150 U/L 85 81 82  AST 5 - 34 U/L 12 14  15   ALT 0 - 55 U/L 6 <6 8       PATHOLOGY REPORT: Diagnosis 05/05/2014 1. Pericardium, biopsy - BENIGN MESOTHELIAL LINED FIBROADIPOSE TISSUE. 2. Endobronchial biopsy, right middle lobe - POORLY DIFFERENTIATED CARCINOMA - SEE COMMENT. 3. Endobronchial biopsy, right lower lobe - POORLY DIFFERENTIATED CARCINOMA. - SEE COMMENT. 1 of 2 FINAL for BARBIE, CROSTON ([id number]) Microscopic Comment 2. , 3. The malignant cells are positive for cytokeratin AE1/AE3 and estrogen receptor. They are negative for cytokeratin 7, cytokeratin 20, S-100, GCDFP, CD45, chromogranin, cytokeratin 5/6, synaptophysin, TTF-1. This immunohistochemical profile is not specific. However, given the patient's history, the positive staining for estrogen receptor is most suggestive of metastatic primary breast carcinoma. A HER-2 will be attempted and the results reportedly separately. (JBK:ecj 05/11/2014)  RADIOGRAPHIC STUDIES:  I have personally reviewed the radiological images as listed and agreed with the findings in the report.  CT chest 05/01/2015  IMPRESSION: 1. Negative for pulmonary embolus. 2. Right hilar mass with compression of the SVC and right upper lobe pulmonary artery and obstruction of the right middle lobe bronchus. Right middle lobe mass and postobstructive collapse. Findings are highly worrisome for primary bronchogenic carcinoma. 3. Possible satellite right lower lobe nodule. 4. Moderate pericardial effusion. 5. Scattered small nodular and ground-glass opacities in the right upper lobe, likely infectious or inflammatory in etiology.  CT chest w contrast 07/03/2014 IMPRESSION: Interval decrease in sizes of RIGHT upper lobe mass, RIGHT hilar adenopathy and mediastinal adenopathy.  RIGHT lower lobe pulmonary nodule decreased in size with a single RIGHT apex nodule minimally increased in size since previous study.   Electronically Signed   By: Lavonia Dana M.D.   On: 07/03/2014 10:48   CT abdomen and  pelvis with contrast 08/14/2014 IMPRESSION: 1. No compelling findings of metastatic disease to the abdomen. 2. Wall thickening in the descending duodenum. Although potentially from The from peristalsis, duodenitis is not excluded. Endoscopy or upper GI fluoroscopic exam may be helpful in further characterization. 3. New ground-glass opacities in the right lower lobe and right middle lobe suggesting alveolitis or pulmonary hemorrhage. 4. Atherosclerosis. 5. Lower lumbar degenerative disc disease.  Bone scan 08/14/2014 IMPRESSION: Solitary focus of abnormal uptake is seen involving the lateral portion of a left middle rib concerning for metastatic lesion.    ASSESSMENT & PLAN:  59 year old female with past medical history of stage IIB left breast cancer, status post mastectomy and axillary lymph node dissection in 2011. Now presented with metastatic disease to lungs and lymph nodes.  1. 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 She never smoked, and immunostain does not support primary lung cancer. -We discussed the natural history of her cancer, unfortunately this is incurable at this stage, but treatable and we do have many treatment options.  -She has had good response to palliative radiation with significant symptom improvement. -PET scan was denied by her insurance. Bone scan showed a possible left lateral rib metastasis. CT of the abdomen was negative for other metastasis. -Her lung metastasis having treated with radiation with good symptomatic and radiographic response -Giving no other significant visceral metastasis, and her recent side effects from radiation, I recommend letrozole and Ibrance as the first line systemic therapy.  -She tolerated the first cycle letrozole and Ibrance  poorly, although some of the symptoms could be related to her anxiety and depression. I recommend her to stop taking letrozole for 5 days, and restart next Wednesday and changed to evening time. -I'll hold Ibrance for second cycle and consider restart with lower dose with this cycle if she tolerates letrozole alone well. - she is slightly neutropenic, ANC 1.5 today, infection precautions were discussed with patient   2. dizziness, anorexia, intermittent nausea -Some are related to her treatment, some could be related to her anxiety and depression -We discussed the benefit of Marinol, and I think it's reasonable to give a try. She is more interested in natural medical marijuana, which is illegal in [place]. -She will call us if she would like to try Marinol, we'll start with low-dose.  2. 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 -She has a mild dyspnea  on exertion, overall her symptoms has significant improved after radiation. -Restaging CT scan in March 2016 showed significant improvement of her lung lesion, right hilar and mediastinal adenopathy. -Repeat bronchoscopy if needed.  3. 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 -I'll obtain a bone density scan to see if she has osteoporosis -Giving the potential side effects on bone from letrozole, I'll give her Delton See if her bone density scan shows osteopenia or osteoporosis   4. Anxiety and depression, coping and social support -Her anxiety and depression got worse due to the side effect from medical treatment  -Continue as xanax as needed. She declined antidepressant medication  -Emotional support and encouragement  Plan: -Hold letrozole for 5 days. If she feels better, she will restart next Wednesday, May 25, in evening.  -hold Leslee Home for second cycle  -Return to clinic in 3 weeks   All questions were answered. The patient knows to call the  clinic with any problems, questions or concerns. I spent 35 minutes counseling the patient face to face. The total time spent in the appointment was 40 minutes and more than 50% was on counseling.     Truitt Merle, MD 10/09/2014 8:57 AM This encounter was created in error - please disregard.

## 2014-10-11 ENCOUNTER — Other Ambulatory Visit: Payer: Self-pay | Admitting: Hematology

## 2014-10-11 NOTE — Progress Notes (Unsigned)
This encounter was created in error - please disregard.

## 2014-10-12 ENCOUNTER — Other Ambulatory Visit: Payer: Self-pay | Admitting: *Deleted

## 2014-10-12 ENCOUNTER — Telehealth: Payer: Self-pay | Admitting: Hematology

## 2014-10-12 NOTE — Telephone Encounter (Signed)
Called and left a message with a new appointment per pof °

## 2014-10-15 ENCOUNTER — Ambulatory Visit: Payer: Medicaid Other | Admitting: Hematology

## 2014-10-15 ENCOUNTER — Other Ambulatory Visit: Payer: Medicaid Other

## 2014-10-19 ENCOUNTER — Telehealth: Payer: Self-pay | Admitting: *Deleted

## 2014-10-19 ENCOUNTER — Telehealth: Payer: Self-pay | Admitting: Hematology

## 2014-10-19 ENCOUNTER — Other Ambulatory Visit: Payer: Self-pay | Admitting: Hematology

## 2014-10-19 NOTE — Telephone Encounter (Signed)
lvm for pt regarding to 6.23 appt.Marland KitchenMarland KitchenMarland Kitchen

## 2014-10-19 NOTE — Telephone Encounter (Signed)
NOTIFIED PT. THAT SHE MUST SEE DR.FENG THIS WEEK BEFORE HER XANAX CAN BE REFILL. PT. VOICES UNDERSTANDING. POF TO SCHEDULER.

## 2014-10-21 ENCOUNTER — Encounter: Payer: Self-pay | Admitting: Hematology

## 2014-10-21 ENCOUNTER — Ambulatory Visit (HOSPITAL_BASED_OUTPATIENT_CLINIC_OR_DEPARTMENT_OTHER): Payer: Medicaid Other | Admitting: Hematology

## 2014-10-21 ENCOUNTER — Other Ambulatory Visit (HOSPITAL_BASED_OUTPATIENT_CLINIC_OR_DEPARTMENT_OTHER): Payer: Medicaid Other

## 2014-10-21 ENCOUNTER — Telehealth: Payer: Self-pay

## 2014-10-21 VITALS — BP 123/75 | HR 104 | Temp 98.1°F | Resp 18 | Ht 63.0 in | Wt 98.8 lb

## 2014-10-21 DIAGNOSIS — C50919 Malignant neoplasm of unspecified site of unspecified female breast: Secondary | ICD-10-CM

## 2014-10-21 DIAGNOSIS — C773 Secondary and unspecified malignant neoplasm of axilla and upper limb lymph nodes: Secondary | ICD-10-CM

## 2014-10-21 DIAGNOSIS — F329 Major depressive disorder, single episode, unspecified: Secondary | ICD-10-CM | POA: Diagnosis not present

## 2014-10-21 DIAGNOSIS — C7951 Secondary malignant neoplasm of bone: Secondary | ICD-10-CM

## 2014-10-21 DIAGNOSIS — C50912 Malignant neoplasm of unspecified site of left female breast: Secondary | ICD-10-CM

## 2014-10-21 DIAGNOSIS — F419 Anxiety disorder, unspecified: Secondary | ICD-10-CM | POA: Diagnosis not present

## 2014-10-21 DIAGNOSIS — C7800 Secondary malignant neoplasm of unspecified lung: Secondary | ICD-10-CM

## 2014-10-21 DIAGNOSIS — R630 Anorexia: Secondary | ICD-10-CM

## 2014-10-21 LAB — CBC WITH DIFFERENTIAL/PLATELET
BASO%: 0.4 % (ref 0.0–2.0)
BASOS ABS: 0 10*3/uL (ref 0.0–0.1)
EOS ABS: 0.2 10*3/uL (ref 0.0–0.5)
EOS%: 3.7 % (ref 0.0–7.0)
HCT: 41.7 % (ref 34.8–46.6)
HGB: 14 g/dL (ref 11.6–15.9)
LYMPH%: 13.9 % — AB (ref 14.0–49.7)
MCH: 29.1 pg (ref 25.1–34.0)
MCHC: 33.6 g/dL (ref 31.5–36.0)
MCV: 86.7 fL (ref 79.5–101.0)
MONO#: 0.6 10*3/uL (ref 0.1–0.9)
MONO%: 12.8 % (ref 0.0–14.0)
NEUT#: 3.2 10*3/uL (ref 1.5–6.5)
NEUT%: 69.2 % (ref 38.4–76.8)
Platelets: 198 10*3/uL (ref 145–400)
RBC: 4.81 10*6/uL (ref 3.70–5.45)
RDW: 16.5 % — ABNORMAL HIGH (ref 11.2–14.5)
WBC: 4.6 10*3/uL (ref 3.9–10.3)
lymph#: 0.6 10*3/uL — ABNORMAL LOW (ref 0.9–3.3)

## 2014-10-21 MED ORDER — ALPRAZOLAM 0.5 MG PO TABS: 0.5000 mg | ORAL_TABLET | Freq: Three times a day (TID) | ORAL | Status: DC | PRN

## 2014-10-21 MED ORDER — FEMARA 2.5 MG PO TABS: 2.5000 mg | ORAL_TABLET | Freq: Every day | ORAL | Status: DC

## 2014-10-21 MED ORDER — DRONABINOL 2.5 MG PO CAPS: 2.5000 mg | ORAL_CAPSULE | Freq: Two times a day (BID) | ORAL | Status: DC

## 2014-10-21 NOTE — Telephone Encounter (Signed)
Gave patient avs report and appointments for July.  °

## 2014-10-21 NOTE — Progress Notes (Signed)
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 Deep surgical margin were positive, pT2N1, tumor size 3.7 x 3.0 x 2.2 cm, grade 3, 2 out of 10 lymph nodes were positive for tumor cells. Patient declined adjuvant chemotherapy, radiation and endocrin therapy   04/30/2014 Progression She presented with worsening cough and dyspnea. CT on 05/01/2015 showed right hilar mass with compression of the SVC and right upper lobe pulmonary artery and obstruction of the right middle lobe bronchus. Moderate pericardial effusion   05/05/2014 Pathology Results Endobronchial biopsy of right lower lobe showed poorly differentiated adenocarcinoma, estrogen receptor positive, CK AE1/AE3 positive. Negative for CK 7, CK 20, TTF-1, S100,etc.  HER2(-). Pericardium biopsy negative.   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 CT abdomen and pelvis is negative for mets.    08/17/2014 -  Anti-estrogen oral therapy Letrozole 81m daily and palbociclib 1278mdaily 3 weeks on 1 week off, starting 08/18/2014, palbo was held for second cycle due to AEs (dizziness, fatigue and nose bleeding).      HISTORY OF PRESENTING ILLNESS:  Patricia Safran72.o. female is here because of recently diagnosed metastatic recurrent breast cancer.  She was diagnosed with pT2 N1M0, stage IIB, left breast cancer in August 2011. She underwent left mastectomy and reconstruction with  im[facility] met to medical oncologist after surgery, and declined adjuvant chemotherapy and endocrine therapy. She has not followed with physician regularly after that and does not have annual mammogram.  She started having dry cough and dyspnea on exertion in May 2016. She had multiple ED visit since then and was treated with multiple courses of antibiotics for bronchitis, which really did not help. In December 2015, she called the bowel pulmonary clinic and was evaluated for worsening cough and dyspnea. A CT of chest was obtained which showed a right hilar mass which cause compression on SVC and right upper lobe pulmonary artery and obstruction of right middle lobe. Moderate pericardial effusion was noticed. She underwent bronchoscopy and pericardial window on 05/01/2014. Biopsy of the right endobronchial lesion showed poorly differentiated adenoma, consistent with breast primary. Pericardial window biopsy and cytology was negative for malignancy.  She started palliative radiation on 06/10/2014, and completed on 06/29/2014, a total of 14 sessions. She tolerated well initially, but developed severe substernal chest pain and dysphagia at the last week of radiation. She still has quite a bit chest pain, is taking Bumex and oxycodone, which helps only some. She is able to eat soft food diet and adequate. She lost about 7 pounds during the radiation. Her dyspnea and cough has significantly improved after radiation.  INTERIM HISTORY: She returns for follow up. She did try anastrozole after I saw her last time, and had similar side effects as letrozole. She stopped afterwards. She [facility] Dr.  WeHarden Mowice in the past month who also recommended antiestrogen hormonal therapy, especially fulvestrant. She has not  decided what to do yet. She feels fine most time, no dizziness, pain, nausea, or other symptoms. She still has intermittent  episodes of anxiety.   MEDICAL HISTORY:  Past Medical History  Diagnosis Date  . Breast cancer   . Breast cancer     SURGICAL HISTORY: Past Surgical History  Procedure Laterality Date  . Mastectomy Left     2010  . Eye surgery      cataract surgery, wears contact left eye  . Subxyphoid pericardial window N/A 05/05/2014    Procedure: SUBXYPHOID PERICARDIAL WINDOW;  Surgeon: Gaye Pollack, MD;  Location: [facility] OR;  Service: Thoracic;  Laterality: N/A;  . Flexible bronchoscopy N/A 05/05/2014    Procedure: FLEXIBLE BRONCHOSCOPY;  Surgeon: Gaye Pollack, MD;  Location: [place];  Service: Thoracic;  Laterality: N/A;  . Video bronchoscopy with endobronchial ultrasound N/A 05/05/2014    Procedure: VIDEO BRONCHOSCOPY WITH ENDOBRONCHIAL ULTRASOUND;  Surgeon: Gaye Pollack, MD;  Location: [place];  Service: Thoracic;  Laterality: N/A;    SOCIAL HISTORY: History   Social History  . Marital Status: Single    Spouse Name: N/A  . Number of Children: N/A  . Years of Education: N/A   Occupational History  . Works PT reception    Social History Main Topics  . Smoking status: Never Smoker   . Smokeless tobacco: Never Used  . Alcohol Use: 0.0 oz/week    0 Standard drinks or equivalent per week     Comment: occ  . Drug Use: No  . Sexual Activity: Not Currently   Other Topics Concern  . Not on file   Social History Narrative    FAMILY HISTORY: Family History  Problem Relation Age of Onset  . Cancer Paternal Aunt     leukemia  . Cancer Father 40    prostate cancer     ALLERGIES:  is allergic to iopamidol; contrast media; prednisone; and zyrtec.  MEDICATIONS:  Current Outpatient Prescriptions  Medication Sig Dispense Refill  . ALPRAZolam (XANAX) 0.5 MG tablet Take 1 tablet (0.5 mg total) by mouth 3 (three) times daily as needed for anxiety. Take 1 tablet every 8 hours prn anxiety 30 tablet 0  . anastrozole (ARIMIDEX) 1 MG tablet Take 1 tablet (1 mg total) by mouth daily. (Patient not  taking: Reported on 10/21/2014) 30 tablet 0  . dronabinol (MARINOL) 2.5 MG capsule Take 1 capsule (2.5 mg total) by mouth 2 (two) times daily before a meal. (Patient not taking: Reported on 10/21/2014) 30 capsule 0  . exemestane (AROMASIN) 25 MG tablet Take 1 tablet (25 mg total) by mouth daily after breakfast. (Patient not taking: Reported on 10/21/2014) 10 tablet 0  . Naproxen Sodium (ALEVE PO) Take 1 tablet by mouth 2 (two) times daily as needed (headache).     . oxyCODONE (OXY IR/ROXICODONE) 5 MG immediate release tablet Take 1-2 tablets (5-10 mg total) by mouth every 4 (four) hours as needed for severe pain. (Patient not taking: Reported on 10/21/2014) 30 tablet 0  . palbociclib (IBRANCE) 125 MG capsule Take 1 capsule (125 mg total) by mouth daily with breakfast. Take whole with food. Take daily x 21 days then 1 week break. (Patient not taking: Reported on 10/21/2014) 21 capsule 3  . prochlorperazine (COMPAZINE) 10 MG tablet Take 1 tablet (10 mg total) by mouth every 6 (six) hours as needed for nausea or vomiting. (Patient not taking: Reported on 08/31/2014) 30 tablet 0   No current facility-administered medications for this  visit.    REVIEW OF SYSTEMS:   Constitutional: Denies fevers, chills or abnormal night sweats. (+) fatigue  Eyes: Denies blurriness of vision, double vision or watery eyes Ears, nose, mouth, throat, and face: Denies mucositis or sore throat Respiratory:cough and dyspnea has significantly improved after radiation, no wheezes Cardiovascular: (+) mid chest pain, Denies palpitation, or lower extremity swelling Gastrointestinal:  Denies nausea, heartburn or change in bowel habits Skin: Denies abnormal skin rashes Lymphatics: Denies new lymphadenopathy or easy bruising Neurological:Denies numbness, tingling or new weaknesses Behavioral/Psych: Mood is stable, no new changes  All other systems were reviewed with the patient and are negative.  PHYSICAL EXAMINATION: ECOG PERFORMANCE  STATUS: 1  Filed Vitals:   10/21/14 1002  BP: 123/75  Pulse: 104  Temp: 98.1 F (36.7 C)  Resp: 18   Filed Weights   10/21/14 1002  Weight: 98 lb 12.8 oz (44.815 kg)    GENERAL:alert, no distress and comfortable SKIN: skin color, texture, turgor are normal, no rashes or significant lesions EYES: normal, conjunctiva are pink and non-injected, sclera clear OROPHARYNX:no exudate, no erythema and lips, buccal mucosa, and tongue normal  NECK: supple, thyroid normal size, non-tender, without nodularity LYMPH:  no palpable lymphadenopathy in the cervical, axillary or inguinal LUNGS: clear to auscultation and percussion with normal breathing effort HEART: regular rate & rhythm and no murmurs and no lower extremity edema ABDOMEN:abdomen soft, non-tender and normal bowel sounds Musculoskeletal:no cyanosis of digits and no clubbing  PSYCH: alert & oriented x 3 with fluent speech NEURO: no focal motor/sensory deficits  LABORATORY DATA:  I have reviewed the data as listed CBC Latest Ref Rng 10/21/2014 09/18/2014 08/31/2014  WBC 3.9 - 10.3 10e3/uL 4.6 2.4(L) 2.8(L)  Hemoglobin 11.6 - 15.9 g/dL 14.0 11.1(L) 11.1(L)  Hematocrit 34.8 - 46.6 % 41.7 33.0(L) 33.8(L)  Platelets 145 - 400 10e3/uL 198 213 270    CMP Latest Ref Rng 08/31/2014 08/25/2014 08/17/2014  Glucose 70 - 140 mg/dl 114 85 100  BUN 7.0 - 26.0 mg/dL 13.3 16.5 14.7  Creatinine 0.6 - 1.1 mg/dL 0.8 0.8 0.7  Sodium 136 - 145 mEq/L 139 139 138  Potassium 3.5 - 5.1 mEq/L 4.3 4.6 4.8  Chloride 96 - 112 mmol/L - - -  CO2 22 - 29 mEq/L 28 23 24   Calcium 8.4 - 10.4 mg/dL 9.3 9.4 9.4  Total Protein 6.4 - 8.3 g/dL 6.6 6.7 6.9  Total Bilirubin 0.20 - 1.20 mg/dL 0.23 0.32 0.23  Alkaline Phos 40 - 150 U/L 85 81 82  AST 5 - 34 U/L 12 14 15   ALT 0 - 55 U/L 6 <6 8       PATHOLOGY REPORT: Diagnosis 05/05/2014 1. Pericardium, biopsy - BENIGN MESOTHELIAL LINED FIBROADIPOSE TISSUE. 2. Endobronchial biopsy, right middle lobe - POORLY  DIFFERENTIATED CARCINOMA - SEE COMMENT. 3. Endobronchial biopsy, right lower lobe - POORLY DIFFERENTIATED CARCINOMA. - SEE COMMENT. 1 of 2 FINAL for AFRA, TRICARICO ([id number]) Microscopic Comment 2. , 3. The malignant cells are positive for cytokeratin AE1/AE3 and estrogen receptor. They are negative for cytokeratin 7, cytokeratin 20, S-100, GCDFP, CD45, chromogranin, cytokeratin 5/6, synaptophysin, TTF-1. This immunohistochemical profile is not specific. However, given the patient's history, the positive staining for estrogen receptor is most suggestive of metastatic primary breast carcinoma. A HER-2 will be attempted and the results reportedly separately. (JBK:ecj 05/11/2014)  Results: HER-2/NEU BY CISH - NO AMPLIFICATION OF HER-2 DETECTED. RESULT RATIO OF HER2: CEP 17 SIGNALS 0.94 AVERAGE HER2 COPY NUMBER PER CELL 1.50  RADIOGRAPHIC STUDIES: I have personally reviewed the radiological images as listed and agreed with the findings in the report.  CT chest 05/01/2015  IMPRESSION: 1. Negative for pulmonary embolus. 2. Right hilar mass with compression of the SVC and right upper lobe pulmonary artery and obstruction of the right middle lobe bronchus. Right middle lobe mass and postobstructive collapse. Findings are highly worrisome for primary bronchogenic carcinoma. 3. Possible satellite right lower lobe nodule. 4. Moderate pericardial effusion. 5. Scattered small nodular and ground-glass opacities in the right upper lobe, likely infectious or inflammatory in etiology.  CT chest w contrast 07/03/2014 IMPRESSION: Interval decrease in sizes of RIGHT upper lobe mass, RIGHT hilar adenopathy and mediastinal adenopathy.  RIGHT lower lobe pulmonary nodule decreased in size with a single RIGHT apex nodule minimally increased in size since previous study.   Electronically Signed   By: Lavonia Dana M.D.   On: 07/03/2014 10:48   CT abdomen and pelvis with contrast 08/14/2014 IMPRESSION: 1. No  compelling findings of metastatic disease to the abdomen. 2. Wall thickening in the descending duodenum. Although potentially from The from peristalsis, duodenitis is not excluded. Endoscopy or upper GI fluoroscopic exam may be helpful in further characterization. 3. New ground-glass opacities in the right lower lobe and right middle lobe suggesting alveolitis or pulmonary hemorrhage. 4. Atherosclerosis. 5. Lower lumbar degenerative disc disease.  Bone scan 08/14/2014 IMPRESSION: Solitary focus of abnormal uptake is seen involving the lateral portion of a left middle rib concerning for metastatic lesion.    ASSESSMENT & PLAN:  59 year old female with past medical history of stage IIB left breast cancer, status post mastectomy and axillary lymph node dissection in 2011. Now presented with metastatic disease to lungs and lymph nodes.  1. 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 She never smoked, and immunostain does not support primary lung cancer. -We discussed the natural history of her cancer, unfortunately this is incurable at this stage, but treatable and we do have many treatment options.  -She has had good response to palliative radiation with significant symptom improvement. -PET scan was denied by her insurance. Bone scan showed a possible left lateral rib metastasis. CT of the abdomen was negative for other metastasis. -Her lung metastasis having treated with radiation with good symptomatic and radiographic response -Giving no other significant visceral metastasis, and her recent side effects from radiation, I recommend letrozole and Ibrance as the first line systemic therapy.  -She tolerated the first cycle letrozole and Ibrance poorly, although some of the symptoms could be related  to her anxiety and depression. She did not tolerate anastrozole also. -She does not want try answer antiestrogen medication for now, but is willing to try letrozole again. She would like to try Marinol with it, and I give her a prescription of Marinol. -I also given her prescription of Femara, to see if she tolerates brand-name letrozole better.  2. dizziness, anorexia, intermittent nausea -Resolved after she stopped letrozole. -She wants to try Marinol, I given her a new prescription today.  2. 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 -She has a mild dyspnea on exertion, overall her symptoms has significant improved after radiation. -Restaging CT scan in March 2016 showed significant improvement of her lung lesion, right hilar and mediastinal adenopathy. -Repeat bronchoscopy if needed.  3. 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 -I'll obtain a bone density scan to see if she has osteoporosis -Giving the potential side effects on bone from letrozole, I'll give her [place] See if her bone density scan shows osteopenia or osteoporosis   4. Anxiety and depression, coping and social support -Her anxiety and depression got worse due to the side effect from medical treatment  -Continue as xanax as needed. She declined antidepressant medication  -Emotional support and encouragement  Plan: -She would like to try Femara, along with Marinol. Post prescription will given to her today -I'll see her back in 3 weeks. -She knows to call us if she has any questions or concerns..    All questions were answered. The patient knows to call the clinic with any problems, questions or concerns. I spent 35 minutes counseling the patient face to face. The total time spent in the appointment was 40 minutes and more than 50% was on counseling.     Truitt Merle, MD 10/21/2014 10:39 AM

## 2014-10-22 ENCOUNTER — Encounter: Payer: Self-pay | Admitting: Hematology

## 2014-10-22 NOTE — Progress Notes (Signed)
I placed prior auth form for dronabinol on desk of nurse for dr. Burr Medico.

## 2014-10-27 ENCOUNTER — Telehealth: Payer: Self-pay | Admitting: *Deleted

## 2014-10-27 NOTE — Telephone Encounter (Signed)
Voicemail received asking when Dr. Burr Medico would be on vacation and the status of Marinol.  [place] with information we've not received response yet for Marinol approval she wants for energy and nausea.  Dr. Burr Medico will be out of office July 2,2016 and returns November 09, 2014.  Next scheduled F/U is 11-13-2014.  With this call asked if she should resume Ibrance.  10-21-2014 visit plan reads to try Marinol and Femara then F/U in three weeks.

## 2014-10-29 ENCOUNTER — Telehealth: Payer: Self-pay | Admitting: *Deleted

## 2014-10-29 ENCOUNTER — Other Ambulatory Visit: Payer: Self-pay | Admitting: Hematology

## 2014-10-29 DIAGNOSIS — C7800 Secondary malignant neoplasm of unspecified lung: Principal | ICD-10-CM

## 2014-10-29 DIAGNOSIS — C50919 Malignant neoplasm of unspecified site of unspecified female breast: Secondary | ICD-10-CM

## 2014-10-29 MED ORDER — FEMARA 2.5 MG PO TABS: 2.5000 mg | ORAL_TABLET | Freq: Every day | ORAL | Status: DC

## 2014-10-29 NOTE — Telephone Encounter (Signed)
Pt called and left message needing clarification on Femara and Marinol.  Called pt back unsuccessfully.  Left message on voice mail requesting a call back from pt to triage nurse. Per Dr. Burr Medico, pt still has generic Letrozole available.  Pt was given brand name Femara ( 7 tablets ).   Pt to start taking both generic and brand name meds.   Pt to call and let nurse know which kind she tolerates better - brand name or generic.  Hold on Ibrance for now per md. Pt's  Phone   [phone].

## 2014-10-29 NOTE — Telephone Encounter (Signed)
1)PT. HAS [place] MEDICAID. A PRESCRIPTION FOR [place] WHICH HAS BRAND NAME MEDICALLY NECESSARY WRITTEN ON THE PRESCRIPTION HAS TO BE FAXED TO THE PHARMACY THEN PT. CAN RECEIVE [place]. 2) PT. STATES [place] MEDICAID DENIED THE DRONABINOL (MARINOL). CHECKED WITH RAQUEL BROWNING IN MANAGED CARE. SHE WILL FOLLOW UP WITH [place] MEDICAID.

## 2014-10-30 NOTE — Telephone Encounter (Signed)
Faxed over script for brand name femara with statement that pt tried generic & didn't tol. & medically necessary to try brand name to [facility], [facility] @ [phone].

## 2014-11-03 ENCOUNTER — Encounter: Payer: Self-pay | Admitting: Hematology

## 2014-11-03 NOTE — Progress Notes (Signed)
I faxed notes to   [phone] 1969 for appeal for denial of dronabinol.

## 2014-11-05 ENCOUNTER — Telehealth: Payer: Self-pay | Admitting: *Deleted

## 2014-11-05 NOTE — Telephone Encounter (Signed)
Called pt back unsuccessfully.  Left message on voice mail requesting a call back to nurse.

## 2014-11-05 NOTE — Telephone Encounter (Signed)
Patient is aware MD Burr Medico is out of office until 11/09/14. Patient would like message to be left for MD Burr Medico for her to call patient about Femara information. Message sent to MD Feng/RN Thu

## 2014-11-06 ENCOUNTER — Telehealth: Payer: Self-pay | Admitting: Internal Medicine

## 2014-11-06 NOTE — Telephone Encounter (Signed)
No - oncology can make the determination whether I can be of help - please give her my best

## 2014-11-06 NOTE — Telephone Encounter (Signed)
I called and made pt aware. She voiced understanding and needed nothing further

## 2014-11-06 NOTE — Telephone Encounter (Signed)
Pt last seen 04/29/14 by MW. She is now being seen by oncology for Metastatic breast cancer to lung.  She is wanting to know if she also needs to see MW. Notes from oncology in epic. Please advise thanks

## 2014-11-09 ENCOUNTER — Telehealth: Payer: Self-pay | Admitting: *Deleted

## 2014-11-09 NOTE — Telephone Encounter (Signed)
VM message received from patient requesting a letter from Dr. Burr Medico to excuse her from jury duty in [facility]. She states she can pick it up on 11/13/14 as she has an appt with Dr. Burr Medico on that day.

## 2014-11-09 NOTE — Telephone Encounter (Signed)
Pt discussed with Dr Burr Medico.

## 2014-11-09 NOTE — Telephone Encounter (Signed)
I called pt back today. She did try [place] once yesterday and had severe side effects which were similar to letrozole. So she did not try again. She will return on Friday to discuss further.  Truitt Merle  11/09/2014

## 2014-11-13 ENCOUNTER — Encounter: Payer: Self-pay | Admitting: Hematology

## 2014-11-13 ENCOUNTER — Other Ambulatory Visit (HOSPITAL_BASED_OUTPATIENT_CLINIC_OR_DEPARTMENT_OTHER): Payer: Medicaid Other

## 2014-11-13 ENCOUNTER — Ambulatory Visit (HOSPITAL_BASED_OUTPATIENT_CLINIC_OR_DEPARTMENT_OTHER): Payer: Medicaid Other | Admitting: Hematology

## 2014-11-13 ENCOUNTER — Telehealth: Payer: Self-pay | Admitting: Hematology

## 2014-11-13 VITALS — BP 126/78 | HR 78 | Temp 97.9°F | Resp 18 | Ht 63.0 in | Wt 102.1 lb

## 2014-11-13 DIAGNOSIS — C7951 Secondary malignant neoplasm of bone: Secondary | ICD-10-CM

## 2014-11-13 DIAGNOSIS — F419 Anxiety disorder, unspecified: Secondary | ICD-10-CM | POA: Diagnosis not present

## 2014-11-13 DIAGNOSIS — C7801 Secondary malignant neoplasm of right lung: Secondary | ICD-10-CM

## 2014-11-13 DIAGNOSIS — C773 Secondary and unspecified malignant neoplasm of axilla and upper limb lymph nodes: Secondary | ICD-10-CM | POA: Diagnosis not present

## 2014-11-13 DIAGNOSIS — F329 Major depressive disorder, single episode, unspecified: Secondary | ICD-10-CM | POA: Diagnosis not present

## 2014-11-13 DIAGNOSIS — C7800 Secondary malignant neoplasm of unspecified lung: Principal | ICD-10-CM

## 2014-11-13 DIAGNOSIS — C50912 Malignant neoplasm of unspecified site of left female breast: Secondary | ICD-10-CM

## 2014-11-13 DIAGNOSIS — C50919 Malignant neoplasm of unspecified site of unspecified female breast: Secondary | ICD-10-CM

## 2014-11-13 LAB — CBC WITH DIFFERENTIAL/PLATELET
BASO%: 0.4 % (ref 0.0–2.0)
Basophils Absolute: 0 10*3/uL (ref 0.0–0.1)
EOS%: 2.1 % (ref 0.0–7.0)
Eosinophils Absolute: 0.1 10*3/uL (ref 0.0–0.5)
HEMATOCRIT: 41.2 % (ref 34.8–46.6)
HGB: 13.8 g/dL (ref 11.6–15.9)
LYMPH%: 10.6 % — ABNORMAL LOW (ref 14.0–49.7)
MCH: 28.9 pg (ref 25.1–34.0)
MCHC: 33.5 g/dL (ref 31.5–36.0)
MCV: 86.2 fL (ref 79.5–101.0)
MONO#: 0.7 10*3/uL (ref 0.1–0.9)
MONO%: 14.2 % — AB (ref 0.0–14.0)
NEUT%: 72.7 % (ref 38.4–76.8)
NEUTROS ABS: 3.4 10*3/uL (ref 1.5–6.5)
Platelets: 171 10*3/uL (ref 145–400)
RBC: 4.78 10*6/uL (ref 3.70–5.45)
RDW: 15.8 % — AB (ref 11.2–14.5)
WBC: 4.7 10*3/uL (ref 3.9–10.3)
lymph#: 0.5 10*3/uL — ABNORMAL LOW (ref 0.9–3.3)

## 2014-11-13 MED ORDER — TAMOXIFEN CITRATE 20 MG PO TABS: 20.0000 mg | ORAL_TABLET | Freq: Every day | ORAL | Status: DC

## 2014-11-13 NOTE — Telephone Encounter (Signed)
per pof to sch pt appt-gave pt copy of avs °

## 2014-11-13 NOTE — Progress Notes (Signed)
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 Deep surgical margin were positive, pT2N1, tumor size 3.7 x 3.0 x 2.2 cm, grade 3, 2 out of 10 lymph nodes were positive for tumor cells. Patient declined adjuvant chemotherapy, radiation and endocrin therapy   04/30/2014 Progression She presented with worsening cough and dyspnea. CT on 05/01/2015 showed right hilar mass with compression of the SVC and right upper lobe pulmonary artery and obstruction of the right middle lobe bronchus. Moderate pericardial effusion   05/05/2014 Pathology Results Endobronchial biopsy of right lower lobe showed poorly differentiated adenocarcinoma, estrogen receptor positive, CK AE1/AE3 positive. Negative for CK 7, CK 20, TTF-1, S100,etc.  HER2(-). Pericardium biopsy negative.   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 CT abdomen and pelvis is negative for mets.    08/17/2014 -  Anti-estrogen oral therapy Letrozole 29m daily and palbociclib 1220mdaily 3 weeks on 1 week off, starting 08/18/2014, palbo was held for second cycle due to AEs (dizziness, fatigue and nose bleeding).      HISTORY OF PRESENTING ILLNESS:  Patricia Stampley59.o. female is here because of recently diagnosed metastatic recurrent breast cancer.  She was diagnosed with pT2 N1M0, stage IIB, left breast cancer in August 2011. She [facility], and declined adjuvant chemotherapy and endocrine therapy. She has not followed with physician regularly after that and does not have annual mammogram.  She started having dry cough and dyspnea on exertion in May 2016. She had multiple ED visit since then and was treated with multiple courses of antibiotics for bronchitis, which really did not help. In December 2015, she called the bowel pulmonary clinic and was evaluated for worsening cough and dyspnea. A CT of chest was obtained which showed a right hilar mass which cause compression on SVC and right upper lobe pulmonary artery and obstruction of right middle lobe. Moderate pericardial effusion was noticed. She underwent bronchoscopy and pericardial window on 05/01/2014. Biopsy of the right endobronchial lesion showed poorly differentiated adenoma, consistent with breast primary. Pericardial window biopsy and cytology was negative for malignancy.  She started palliative radiation on 06/10/2014, and completed on 06/29/2014, a total of 14 sessions. She tolerated well initially, but developed severe substernal chest pain and dysphagia at the last week of radiation. She still has quite a bit chest pain, is taking Bumex and oxycodone, which helps only some. She is able to eat soft food diet and adequate. She lost about 7 pounds during the radiation. Her dyspnea and cough has significantly improved after radiation.  INTERIM HISTORY: She returns for follow up. She did try Femara once 5 days ago, and experienced similar side effects, including dizziness, sweating, nausea etc, and symptoms resolved spontaneously after several hours. Today before she took Femara, she was quite stressed out because her brother decided stopping her rental and other financial  assistance. She has talked to her daughter and ex-husband, and is trying to help her to find a  place to live. She is not able to afford her rental. She has mild fatigue, but able to function well, and tolerating routine activity. She occasionally has some chest tightness, nonexertional, no other complaints. She is still extremely anxious, and stressed out by her financial situation.   MEDICAL HISTORY:  Past Medical History  Diagnosis Date  . Breast cancer   . Breast cancer     SURGICAL HISTORY: Past Surgical History  Procedure Laterality Date  . Mastectomy Left     2010  . Eye surgery      cataract surgery, wears contact left eye  . Subxyphoid pericardial window N/A 05/05/2014    Procedure: SUBXYPHOID PERICARDIAL WINDOW;  Surgeon: Gaye Pollack, MD;  Location: [facility] OR;  Service: Thoracic;  Laterality: N/A;  . Flexible bronchoscopy N/A 05/05/2014    Procedure: FLEXIBLE BRONCHOSCOPY;  Surgeon: Gaye Pollack, MD;  Location: [place];  Service: Thoracic;  Laterality: N/A;  . Video bronchoscopy with endobronchial ultrasound N/A 05/05/2014    Procedure: VIDEO BRONCHOSCOPY WITH ENDOBRONCHIAL ULTRASOUND;  Surgeon: Gaye Pollack, MD;  Location: [place];  Service: Thoracic;  Laterality: N/A;    SOCIAL HISTORY: History   Social History  . Marital Status: Single    Spouse Name: N/A  . Number of Children: N/A  . Years of Education: N/A   Occupational History  . Works PT reception    Social History Main Topics  . Smoking status: Never Smoker   . Smokeless tobacco: Never Used  . Alcohol Use: 0.0 oz/week    0 Standard drinks or equivalent per week     Comment: occ  . Drug Use: No  . Sexual Activity: Not Currently   Other Topics Concern  . Not on file   Social History Narrative    FAMILY HISTORY: Family History  Problem Relation Age of Onset  . Cancer Paternal Aunt     leukemia  . Cancer Father 44    prostate cancer     ALLERGIES:  is allergic to iopamidol; contrast media; prednisone; and zyrtec.  MEDICATIONS:  Current Outpatient Prescriptions  Medication Sig Dispense  Refill  . ALPRAZolam (XANAX) 0.5 MG tablet Take 1 tablet (0.5 mg total) by mouth 3 (three) times daily as needed for anxiety. Take 1 tablet every 8 hours prn anxiety 60 tablet 0  . oxyCODONE (OXY IR/ROXICODONE) 5 MG immediate release tablet Take 1-2 tablets (5-10 mg total) by mouth every 4 (four) hours as needed for severe pain. (Patient not taking: Reported on 10/21/2014) 30 tablet 0  . palbociclib (IBRANCE) 125 MG capsule Take 1 capsule (125 mg total) by mouth daily with breakfast. Take whole with food. Take daily x 21 days then 1 week break. (Patient not taking: Reported on 10/21/2014) 21 capsule 3  . prochlorperazine (COMPAZINE) 10 MG tablet Take 1 tablet (10 mg total) by mouth every 6 (six) hours as needed for nausea or vomiting. (Patient not taking: Reported on 08/31/2014) 30 tablet 0  . tamoxifen (NOLVADEX) 20 MG tablet Take 1 tablet (20 mg total) by mouth daily. 14 tablet 0   No current facility-administered medications for this visit.    REVIEW OF SYSTEMS:   Constitutional: Denies fevers, chills or abnormal night sweats. (+) fatigue  Eyes: Denies blurriness of vision, double vision or watery eyes Ears, nose, mouth, throat, and face: Denies mucositis or sore throat Respiratory:cough and dyspnea has  significantly improved after radiation, no wheezes Cardiovascular: (+) mid chest pain, Denies palpitation, or lower extremity swelling Gastrointestinal:  Denies nausea, heartburn or change in bowel habits Skin: Denies abnormal skin rashes Lymphatics: Denies new lymphadenopathy or easy bruising Neurological:Denies numbness, tingling or new weaknesses Behavioral/Psych: Mood is stable, no new changes  All other systems were reviewed with the patient and are negative.  PHYSICAL EXAMINATION: ECOG PERFORMANCE STATUS: 1  Filed Vitals:   11/13/14 1031  BP: 126/78  Pulse: 78  Temp: 97.9 F (36.6 C)  Resp: 18   Filed Weights   11/13/14 1031  Weight: 102 lb 1.6 oz (46.312 kg)     GENERAL:alert, no distress and comfortable SKIN: skin color, texture, turgor are normal, no rashes or significant lesions EYES: normal, conjunctiva are pink and non-injected, sclera clear OROPHARYNX:no exudate, no erythema and lips, buccal mucosa, and tongue normal  NECK: supple, thyroid normal size, non-tender, without nodularity LYMPH:  no palpable lymphadenopathy in the cervical, axillary or inguinal LUNGS: clear to auscultation and percussion with normal breathing effort HEART: regular rate & rhythm and no murmurs and no lower extremity edema ABDOMEN:abdomen soft, non-tender and normal bowel sounds Musculoskeletal:no cyanosis of digits and no clubbing  PSYCH: alert & oriented x 3 with fluent speech NEURO: no focal motor/sensory deficits  LABORATORY DATA:  I have reviewed the data as listed CBC Latest Ref Rng 11/13/2014 10/21/2014 09/18/2014  WBC 3.9 - 10.3 10e3/uL 4.7 4.6 2.4(L)  Hemoglobin 11.6 - 15.9 g/dL 13.8 14.0 11.1(L)  Hematocrit 34.8 - 46.6 % 41.2 41.7 33.0(L)  Platelets 145 - 400 10e3/uL 171 198 213    CMP Latest Ref Rng 08/31/2014 08/25/2014 08/17/2014  Glucose 70 - 140 mg/dl 114 85 100  BUN 7.0 - 26.0 mg/dL 13.3 16.5 14.7  Creatinine 0.6 - 1.1 mg/dL 0.8 0.8 0.7  Sodium 136 - 145 mEq/L 139 139 138  Potassium 3.5 - 5.1 mEq/L 4.3 4.6 4.8  Chloride 96 - 112 mmol/L - - -  CO2 22 - 29 mEq/L 28 23 24   Calcium 8.4 - 10.4 mg/dL 9.3 9.4 9.4  Total Protein 6.4 - 8.3 g/dL 6.6 6.7 6.9  Total Bilirubin 0.20 - 1.20 mg/dL 0.23 0.32 0.23  Alkaline Phos 40 - 150 U/L 85 81 82  AST 5 - 34 U/L 12 14 15   ALT 0 - 55 U/L 6 <6 8       PATHOLOGY REPORT: Diagnosis 05/05/2014 1. Pericardium, biopsy - BENIGN MESOTHELIAL LINED FIBROADIPOSE TISSUE. 2. Endobronchial biopsy, right middle lobe - POORLY DIFFERENTIATED CARCINOMA - SEE COMMENT. 3. Endobronchial biopsy, right lower lobe - POORLY DIFFERENTIATED CARCINOMA. - SEE COMMENT. 1 of 2 FINAL for ALIEAH, BRINTON ([id number]) Microscopic  Comment 2. , 3. The malignant cells are positive for cytokeratin AE1/AE3 and estrogen receptor. They are negative for cytokeratin 7, cytokeratin 20, S-100, GCDFP, CD45, chromogranin, cytokeratin 5/6, synaptophysin, TTF-1. This immunohistochemical profile is not specific. However, given the patient's history, the positive staining for estrogen receptor is most suggestive of metastatic primary breast carcinoma. A HER-2 will be attempted and the results reportedly separately. (JBK:ecj 05/11/2014)  Results: HER-2/NEU BY CISH - NO AMPLIFICATION OF HER-2 DETECTED. RESULT RATIO OF HER2: CEP 17 SIGNALS 0.94 AVERAGE HER2 COPY NUMBER PER CELL 1.50  RADIOGRAPHIC STUDIES: I have personally reviewed the radiological images as listed and agreed with the findings in the report.  CT chest 05/01/2015  IMPRESSION: 1. Negative for pulmonary embolus. 2. Right hilar mass with compression of the SVC and right upper lobe pulmonary  artery and obstruction of the right middle lobe bronchus. Right middle lobe mass and postobstructive collapse. Findings are highly worrisome for primary bronchogenic carcinoma. 3. Possible satellite right lower lobe nodule. 4. Moderate pericardial effusion. 5. Scattered small nodular and ground-glass opacities in the right upper lobe, likely infectious or inflammatory in etiology.  CT chest w contrast 07/03/2014 IMPRESSION: Interval decrease in sizes of RIGHT upper lobe mass, RIGHT hilar adenopathy and mediastinal adenopathy.  RIGHT lower lobe pulmonary nodule decreased in size with a single RIGHT apex nodule minimally increased in size since previous study.   Electronically Signed   By: Lavonia Dana M.D.   On: 07/03/2014 10:48   CT abdomen and pelvis with contrast 08/14/2014 IMPRESSION: 1. No compelling findings of metastatic disease to the abdomen. 2. Wall thickening in the descending duodenum. Although potentially from The from peristalsis, duodenitis is not excluded. Endoscopy  or upper GI fluoroscopic exam may be helpful in further characterization. 3. New ground-glass opacities in the right lower lobe and right middle lobe suggesting alveolitis or pulmonary hemorrhage. 4. Atherosclerosis. 5. Lower lumbar degenerative disc disease.  Bone scan 08/14/2014 IMPRESSION: Solitary focus of abnormal uptake is seen involving the lateral portion of a left middle rib concerning for metastatic lesion.    ASSESSMENT & PLAN:  59 year old female with past medical history of stage IIB left breast cancer, status post mastectomy and axillary lymph node dissection in 2011. Now presented with metastatic disease to lungs and lymph nodes.  1. 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 She never smoked, and immunostain does not support primary lung cancer. -We discussed the natural history of her cancer, unfortunately this is incurable at this stage, but treatable and we do have many treatment options.  -She has had good response to palliative radiation with significant symptom improvement. -PET scan was denied by her insurance. Bone scan showed a possible left lateral rib metastasis. CT of the abdomen was negative for other metastasis. -Her lung metastasis having treated with radiation with good symptomatic and radiographic response -Giving no other significant visceral metastasis, and her recent side effects from radiation, I recommend letrozole and Ibrance as the first line systemic therapy.  -She tolerated the first cycle letrozole and Ibrance poorly, although some of the symptoms could be related to her anxiety and depression. She did not tolerate anastrozole also. -I recommend her to try tamoxifen. Potential side effects of tamoxifen, such as hot flash, scan of vaginal dryness, small  risk of thrombosis and endometrial cancer, we'll discussed with her in details, she agrees to try.   2. dizziness, anorexia, intermittent nausea -Resolved after she stopped letrozole. -She wants to try Marinol, I gave her a prescription, but is not covered by her insurance  2. 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 -She has a mild dyspnea on exertion, overall her symptoms has significant improved after radiation. -Restaging CT scan in March 2016 showed significant improvement of her lung lesion, right hilar and mediastinal adenopathy. -Repeat bronchoscopy if needed.  3. 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 -May consider Xgeva when she is stable on antiestrogen therapy  4. Anxiety and depression, coping and social support -Her anxiety and depression got worse due to the side effect from medical treatment  -Continue as xanax as needed. She declined  antidepressant medication  -Emotional support and encouragement  Plan: -Tamoxifen prescription was called in today, she will start next Monday -I'll see her back in 2 weeks  All questions were answered. The patient knows to call the clinic with any problems, questions or concerns. I spent 25 minutes counseling the patient face to face. The total time spent in the appointment was 30 minutes and more than 50% was on counseling.     Truitt Merle, MD 11/13/2014 11:37 AM

## 2014-11-16 ENCOUNTER — Telehealth: Payer: Self-pay | Admitting: *Deleted

## 2014-11-16 NOTE — Telephone Encounter (Signed)
Please check if she has madcaid, if yes, her GYN referral needs to be requested by her PCP  Truitt Merle  11/16/2014

## 2014-11-16 NOTE — Telephone Encounter (Signed)
VM message from patient @ 4:03 pm requesting referral for GYN doctor. States she has some gyn/urinary problems

## 2014-11-17 ENCOUNTER — Encounter (HOSPITAL_COMMUNITY): Payer: Self-pay | Admitting: *Deleted

## 2014-11-17 ENCOUNTER — Emergency Department (HOSPITAL_COMMUNITY)
Admission: EM | Admit: 2014-11-17 | Discharge: 2014-11-17 | Disposition: A | Discharge status: Home / Self Care | Payer: Medicaid Other | Attending: Emergency Medicine | Admitting: Emergency Medicine

## 2014-11-17 ENCOUNTER — Emergency Department (HOSPITAL_COMMUNITY): Payer: Medicaid Other

## 2014-11-17 DIAGNOSIS — F419 Anxiety disorder, unspecified: Secondary | ICD-10-CM | POA: Diagnosis not present

## 2014-11-17 DIAGNOSIS — R002 Palpitations: Secondary | ICD-10-CM | POA: Diagnosis present

## 2014-11-17 DIAGNOSIS — Z853 Personal history of malignant neoplasm of breast: Secondary | ICD-10-CM | POA: Diagnosis not present

## 2014-11-17 DIAGNOSIS — R110 Nausea: Secondary | ICD-10-CM | POA: Diagnosis not present

## 2014-11-17 LAB — BASIC METABOLIC PANEL
Anion gap: 10 (ref 5–15)
BUN: 11 mg/dL (ref 6–20)
CALCIUM: 10.1 mg/dL (ref 8.9–10.3)
CO2: 25 mmol/L (ref 22–32)
CREATININE: 0.82 mg/dL (ref 0.44–1.00)
Chloride: 105 mmol/L (ref 101–111)
GLUCOSE: 94 mg/dL (ref 65–99)
Potassium: 4 mmol/L (ref 3.5–5.1)
Sodium: 140 mmol/L (ref 135–145)

## 2014-11-17 LAB — CBC WITH DIFFERENTIAL/PLATELET
Basophils Absolute: 0 10*3/uL (ref 0.0–0.1)
Basophils Relative: 0 % (ref 0–1)
EOS PCT: 2 % (ref 0–5)
Eosinophils Absolute: 0.1 10*3/uL (ref 0.0–0.7)
HEMATOCRIT: 45.4 % (ref 36.0–46.0)
Hemoglobin: 15.7 g/dL — ABNORMAL HIGH (ref 12.0–15.0)
LYMPHS ABS: 0.5 10*3/uL — AB (ref 0.7–4.0)
LYMPHS PCT: 10 % — AB (ref 12–46)
MCH: 29.1 pg (ref 26.0–34.0)
MCHC: 34.6 g/dL (ref 30.0–36.0)
MCV: 84.2 fL (ref 78.0–100.0)
Monocytes Absolute: 0.6 10*3/uL (ref 0.1–1.0)
Monocytes Relative: 11 % (ref 3–12)
NEUTROS PCT: 77 % (ref 43–77)
Neutro Abs: 4.1 10*3/uL (ref 1.7–7.7)
Platelets: 192 10*3/uL (ref 150–400)
RBC: 5.39 MIL/uL — ABNORMAL HIGH (ref 3.87–5.11)
RDW: 15.7 % — ABNORMAL HIGH (ref 11.5–15.5)
WBC: 5.3 10*3/uL (ref 4.0–10.5)

## 2014-11-17 LAB — URINALYSIS, ROUTINE W REFLEX MICROSCOPIC
BILIRUBIN URINE: NEGATIVE
GLUCOSE, UA: NEGATIVE mg/dL
Hgb urine dipstick: NEGATIVE
KETONES UR: 15 mg/dL — AB
Leukocytes, UA: NEGATIVE
NITRITE: NEGATIVE
PH: 6.5 (ref 5.0–8.0)
Protein, ur: NEGATIVE mg/dL
SPECIFIC GRAVITY, URINE: 1.008 (ref 1.005–1.030)
Urobilinogen, UA: 0.2 mg/dL (ref 0.0–1.0)

## 2014-11-17 LAB — I-STAT VENOUS BLOOD GAS, ED
Acid-Base Excess: 1 mmol/L (ref 0.0–2.0)
BICARBONATE: 26.2 meq/L — AB (ref 20.0–24.0)
O2 Saturation: 76 %
TCO2: 28 mmol/L (ref 0–100)
pCO2, Ven: 43.4 mmHg — ABNORMAL LOW (ref 45.0–50.0)
pH, Ven: 7.388 — ABNORMAL HIGH (ref 7.250–7.300)
pO2, Ven: 41 mmHg (ref 30.0–45.0)

## 2014-11-17 MED ORDER — ALPRAZOLAM 0.25 MG PO TABS
0.5000 mg | ORAL_TABLET | Freq: Once | ORAL | Status: AC
  Administered 2014-11-17: 0.5 mg via ORAL
  Filled 2014-11-17: qty 2

## 2014-11-17 NOTE — ED Provider Notes (Signed)
CSN: [id number]     Arrival date & time 11/17/14  0151 History  This chart was scribed for Debby Freiberg, MD by Randa Evens, ED Scribe. This patient was seen in room A02C/A02C and the patient's care was started at 1:55 AM.     Chief Complaint  Patient presents with  . Palpitations    Patient is a 59 y.o. female presenting with palpitations. The history is provided by the patient. No language interpreter was used.  Palpitations Palpitations quality:  Fast Onset quality:  Gradual Duration:  12 hours Progression:  Worsening Context: anxiety   Relieved by:  None tried Worsened by:  Nothing Associated symptoms: nausea   Associated symptoms: no chest pain    HPI Comments: Patricia Carter is a 59 y.o. female with PMHX listed below and Anxiety brought in by ambulance, who presents to the Emergency Department complaining of worsening palpitations onset about 12 hours PTA. Pt states that she has had some slight nausea. Pt states that she is a cancer Pt and states that her cancer Dr is trying different medications with her. She states that today was the first day she tried tamoxifen and since taking it she has felt as if her heart was racing. She reports some SOB that is not new and has been present since being diagnosed with cancer. Pt also reports some "burning pain in her urethra" but not during urination. Pt denies CP, fever or other related symptoms.      Past Medical History  Diagnosis Date  . Breast cancer   . Breast cancer    Past Surgical History  Procedure Laterality Date  . Mastectomy Left     2010  . Eye surgery      cataract surgery, wears contact left eye  . Subxyphoid pericardial window N/A 05/05/2014    Procedure: SUBXYPHOID PERICARDIAL WINDOW;  Surgeon: Gaye Pollack, MD;  Location: [facility] OR;  Service: Thoracic;  Laterality: N/A;  . Flexible bronchoscopy N/A 05/05/2014    Procedure: FLEXIBLE BRONCHOSCOPY;  Surgeon: Gaye Pollack, MD;  Location[facility] OR;  Service: Thoracic;   Laterality: N/A;  . Video bronchoscopy with endobronchial ultrasound N/A 05/05/2014    Procedure: VIDEO BRONCHOSCOPY WITH ENDOBRONCHIAL ULTRASOUND;  Surgeon: Gaye Pollack, MD;  Location: [facility] OR;  Service: Thoracic;  Laterality: N/A;   Family History  Problem Relation Age of Onset  . Cancer Paternal Aunt     leukemia  . Cancer Father 12    prostate cancer    History  Substance Use Topics  . Smoking status: Never Smoker   . Smokeless tobacco: Never Used  . Alcohol Use: 0.0 oz/week    0 Standard drinks or equivalent per week     Comment: occ   OB History    No data available     Review of Systems  Cardiovascular: Positive for palpitations. Negative for chest pain.  Gastrointestinal: Positive for nausea.  Psychiatric/Behavioral: The patient is nervous/anxious.   All other systems reviewed and are negative.     Allergies  Iopamidol; Contrast media; Prednisone; and Zyrtec  Home Medications   Prior to Admission medications   Medication Sig Start Date End Date Taking? Authorizing Provider  ALPRAZolam Duanne Moron) 0.5 MG tablet Take 1 tablet (0.5 mg total) by mouth 3 (three) times daily as needed for anxiety. Take 1 tablet every 8 hours prn anxiety 10/21/14  Yes Truitt Merle, MD  tamoxifen (NOLVADEX) 20 MG tablet Take 1 tablet (20 mg total) by mouth daily. 11/13/14  Yes Truitt Merle, MD  oxyCODONE (OXY IR/ROXICODONE) 5 MG immediate release tablet Take 1-2 tablets (5-10 mg total) by mouth every 4 (four) hours as needed for severe pain. Patient not taking: Reported on 10/21/2014 06/29/14   Gery Pray, MD  palbociclib [facility]) 125 MG capsule Take 1 capsule (125 mg total) by mouth daily with breakfast. Take whole with food. Take daily x 21 days then 1 week break. Patient not taking: Reported on 10/21/2014 09/14/14   Truitt Merle, MD  prochlorperazine (COMPAZINE) 10 MG tablet Take 1 tablet (10 mg total) by mouth every 6 (six) hours as needed for nausea or vomiting. Patient not taking: Reported on 08/31/2014  08/25/14   Susanne Borders, NP   BP 102/78 mmHg  Pulse 96  Temp(Src) 98 F (36.7 C) (Oral)  Resp 17  Ht 5' 3"  (1.6 m)  Wt 100 lb (45.36 kg)  BMI 17.72 kg/m2  SpO2 99%   Physical Exam  Constitutional: She is oriented to person, place, and time. She appears well-developed and well-nourished.  HENT:  Head: Normocephalic and atraumatic.  Right Ear: External ear normal.  Left Ear: External ear normal.  Eyes: Conjunctivae and EOM are normal. Pupils are equal, round, and reactive to light.  Neck: Normal range of motion. Neck supple.  Cardiovascular: Normal rate, regular rhythm, normal heart sounds and intact distal pulses.   Pulmonary/Chest: Effort normal and breath sounds normal.  Abdominal: Soft. Bowel sounds are normal. There is no tenderness.  Musculoskeletal: Normal range of motion.  Neurological: She is alert and oriented to person, place, and time.  Skin: Skin is warm and dry.  Vitals reviewed.   ED Course  Procedures (including critical care time) DIAGNOSTIC STUDIES: Oxygen Saturation is 100% on RA, normal by my interpretation.    COORDINATION OF CARE: 2:45 AM-Discussed treatment plan with pt at bedside and pt agreed to plan.     Labs Review Labs Reviewed  CBC WITH DIFFERENTIAL/PLATELET - Abnormal; Notable for the following:    RBC 5.39 (*)    Hemoglobin 15.7 (*)    RDW 15.7 (*)    Lymphocytes Relative 10 (*)    Lymphs Abs 0.5 (*)    All other components within normal limits  URINALYSIS, ROUTINE W REFLEX MICROSCOPIC (NOT AT [facility]) - Abnormal; Notable for the following:    Ketones, ur 15 (*)    All other components within normal limits  I-STAT VENOUS BLOOD GAS, ED - Abnormal; Notable for the following:    pH, Ven 7.388 (*)    pCO2, Ven 43.4 (*)    Bicarbonate 26.2 (*)    All other components within normal limits  BASIC METABOLIC PANEL    Imaging Review Dg Chest 2 View  11/17/2014   CLINICAL DATA:  59 year old female with dyspnea  EXAM: CHEST  2 VIEW   COMPARISON:  Chest CT and radiograph dated 03/04 6 pain  FINDINGS: Two views of the chest demonstrates emphysematous changes of the lungs. An area of the knee are density in the right middle lobe corresponds to the scarring seen on the prior CT. There is no pleural effusion or pneumothorax. The cardiomediastinal silhouette is within normal limits. There is osteopenia with degenerative changes of the spine. Mastectomy changes.  IMPRESSION: Emphysema with right middle lobe atelectasis/ scarring. No pneumothorax.   Electronically Signed   By: Anner Crete M.D.   On: 11/17/2014 03:07     EKG Interpretation   Date/Time:  Tuesday November 17 2014 02:04:11 EDT Ventricular Rate:  90 PR Interval:  135 QRS Duration: 94 QT Interval:  397 QTC Calculation: 486 R Axis:   85 Text Interpretation:  Sinus rhythm Biatrial enlargement Borderline right  axis deviation Confirmed by Debby Freiberg [phone]) on 11/17/2014 2:28:06  AM      MDM   Final diagnoses:  Heart palpitations     59 y.o. female with pertinent PMH of metastatic breast ca with pulm met presents with palpitations as above. She has a long-standing history of anxiety and states that this feels like prior anxiety. She also has secondary complaint of urethral burning, not present during urination. She denies systemic symptoms such as fever, chest pain, vomiting, diarrhea. On arrival vital signs and physical exam as above. These were all reassuring. Patient was given Xanax with resolution of all symptoms. Considered PE as etiology of symptoms, however patient has no shortness of breath, is not tachycardic, denies chest pain. We did discuss this in the differential and with shared decision-making agreed to defer CT scan at this time. Patient was encouraged that if she had any shortness of breath that she should immediately return., Petra Kuba of symptoms and signs make likely etiology multifactorial, anxiety and medication reaction being the most prominent.  Discharged home in stable condition with standard return precautions.    I have reviewed all laboratory and imaging studies if ordered as above  1. Heart palpitations           Debby Freiberg, MD 11/17/14 [phone]

## 2014-11-17 NOTE — ED Notes (Signed)
Pt. Is from home and called EMS because she felt like her heart was racing. On EMS arrival pt. HR 116 RR 30. Pt has a hx of anxiety and a left mastectomy. Pt. Also c/o a red spot on her labia and wants to be checked for a UTI

## 2014-11-17 NOTE — ED Notes (Signed)
Patient reports that she is a lung cancer patient. Spoke of urethra being red, but denies pain during urination. Denies pain at this time.

## 2014-11-17 NOTE — Discharge Instructions (Signed)

## 2014-11-17 NOTE — Telephone Encounter (Signed)
TC to patient with recommendation that she contact her PCP for GYN referral. Patient states she does have a call in to her PCP.

## 2014-11-19 ENCOUNTER — Telehealth: Payer: Self-pay | Admitting: Hematology

## 2014-11-19 ENCOUNTER — Telehealth: Payer: Self-pay | Admitting: *Deleted

## 2014-11-19 NOTE — Telephone Encounter (Signed)
LEFT PT. A VOICE MAIL TO RETURN CALL CONCERNING EMERGENCY DEPARTMENT VISIT ON 11/17/14 AFTER TAKING TAMOXIFEN.

## 2014-11-19 NOTE — Telephone Encounter (Signed)
VOICE MAIL FROM 3:53PM SCHEDULER, KIM JARVIS. RETURN CALL AT 4:40PM. PT. TOOK FIRST DOSE OF TAMOXIFEN ON 11/16/14 AT 7:00AM. PT.'S SYMPTOMS WERE DIZZINESS, "JITTERY", STOMACH PAINS, VAGINAL DISCOMFORT, TINGLING IN HER LEGS, UNABLE TO SLEEP, AND HEART PALPITATIONS. THE SYMPTOMS BECAME PROGRESSIVELY WORSE SO AT 1:30AM ON 11/17/14 PT. CALLED 911 AND WENT TO THE EMERGENCY DEPARTMENT. SHE HAS NOT TAKEN ANY MORE TAMOXIFEN AND IS FEELING BETTER.

## 2014-11-19 NOTE — Telephone Encounter (Signed)
pt cld to r/s pt appt-gave pt r/s time & date

## 2014-11-20 NOTE — Telephone Encounter (Signed)
I will call her when I return to office next week, OK to hold tamoxifen for now. Please let her know, thanks.  Truitt Merle  11/20/2014

## 2014-11-20 NOTE — Telephone Encounter (Signed)
NOTIFIED PT. THAT DR.FENG WILL CALL HER NEXT WEEK. ALSO DR.FENG SAID IT IS OK FOR PT. TO STOP THE TAMOXIFEN FOR NOW. PT. VOICES UNDERSTANDING.

## 2014-11-25 ENCOUNTER — Other Ambulatory Visit: Payer: Self-pay | Admitting: Hematology

## 2014-11-25 DIAGNOSIS — C50919 Malignant neoplasm of unspecified site of unspecified female breast: Secondary | ICD-10-CM

## 2014-11-26 NOTE — Telephone Encounter (Signed)
Call from [facility] for alprazolam refill. Per Dr Burr Medico OV note 11/13/14 refill called in.

## 2014-11-27 ENCOUNTER — Ambulatory Visit: Payer: Medicaid Other | Admitting: Hematology

## 2014-11-27 ENCOUNTER — Other Ambulatory Visit: Payer: Medicaid Other

## 2014-12-01 ENCOUNTER — Telehealth: Payer: Self-pay | Admitting: *Deleted

## 2014-12-01 ENCOUNTER — Encounter: Payer: Self-pay | Admitting: Hematology

## 2014-12-01 ENCOUNTER — Other Ambulatory Visit: Payer: Self-pay | Admitting: *Deleted

## 2014-12-01 ENCOUNTER — Telehealth: Payer: Self-pay | Admitting: Hematology

## 2014-12-01 ENCOUNTER — Ambulatory Visit (HOSPITAL_BASED_OUTPATIENT_CLINIC_OR_DEPARTMENT_OTHER): Payer: Medicaid Other | Admitting: Hematology

## 2014-12-01 VITALS — BP 110/71 | HR 99 | Temp 98.8°F | Resp 18 | Ht 63.0 in | Wt 100.6 lb

## 2014-12-01 DIAGNOSIS — C7951 Secondary malignant neoplasm of bone: Secondary | ICD-10-CM

## 2014-12-01 DIAGNOSIS — R530 Neoplastic (malignant) related fatigue: Secondary | ICD-10-CM | POA: Diagnosis present

## 2014-12-01 DIAGNOSIS — C7801 Secondary malignant neoplasm of right lung: Secondary | ICD-10-CM

## 2014-12-01 DIAGNOSIS — C50912 Malignant neoplasm of unspecified site of left female breast: Secondary | ICD-10-CM | POA: Diagnosis not present

## 2014-12-01 DIAGNOSIS — C7800 Secondary malignant neoplasm of unspecified lung: Secondary | ICD-10-CM

## 2014-12-01 DIAGNOSIS — C50919 Malignant neoplasm of unspecified site of unspecified female breast: Secondary | ICD-10-CM

## 2014-12-01 NOTE — Progress Notes (Signed)
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 Deep surgical margin were positive, pT2N1, tumor size 3.7 x 3.0 x 2.2 cm, grade 3, 2 out of 10 lymph nodes were positive for tumor cells. Patient declined adjuvant chemotherapy, radiation and endocrin therapy   04/30/2014 Progression She presented with worsening cough and dyspnea. CT on 05/01/2015 showed right hilar mass with compression of the SVC and right upper lobe pulmonary artery and obstruction of the right middle lobe bronchus. Moderate pericardial effusion   05/05/2014 Pathology Results Endobronchial biopsy of right lower lobe showed poorly differentiated adenocarcinoma, estrogen receptor positive, CK AE1/AE3 positive. Negative for CK 7, CK 20, TTF-1, S100,etc.  HER2(-). Pericardium biopsy negative.   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 CT abdomen and pelvis is negative for mets.    08/17/2014 -  Anti-estrogen oral therapy Letrozole 70m daily and palbociclib 122mdaily 3 weeks on 1 week off, starting 08/18/2014, palbo was held for second cycle due to AEs (dizziness, fatigue and nose bleeding).      HISTORY OF PRESENTING ILLNESS:  Patricia Brownrigg59.o. female is here because of recently diagnosed metastatic recurrent breast cancer.  She was diagnosed with pT2 N1M0, stage IIB, left breast cancer in August 2011. [facility], and declined adjuvant chemotherapy and endocrine therapy. She has not followed with physician regularly after that and does not have annual mammogram.  She started having dry cough and dyspnea on exertion in May 2016. She had multiple ED visit since then and was treated with multiple courses of antibiotics for bronchitis, which really did not help. In December 2015, she called the bowel pulmonary clinic and was evaluated for worsening cough and dyspnea. A CT of chest was obtained which showed a right hilar mass which cause compression on SVC and right upper lobe pulmonary artery and obstruction of right middle lobe. Moderate pericardial effusion was noticed. She underwent bronchoscopy and pericardial window on 05/01/2014. Biopsy of the right endobronchial lesion showed poorly differentiated adenoma, consistent with breast primary. Pericardial window biopsy and cytology was negative for malignancy.  She started palliative radiation on 06/10/2014, and completed on 06/29/2014, a total of 14 sessions. She tolerated well initially, but developed severe substernal chest pain and dysphagia at the last week of radiation. She still has quite a bit chest pain, is taking Bumex and oxycodone, which helps only some. She is able to eat soft food diet and adequate. She lost about 7 pounds during the radiation. Her dyspnea and cough has significantly improved after radiation.  INTERIM HISTORY: She returns for follow up. She did try Tamoxifen after her last visit with me, and again had severe reaction with dizziness, nausea, restless, similar to her symptoms with letrozole. She called and I told her to stop. She still quite anxious most of time, and stressed by her current living situation.  She has discussed with her ex-husband recently, and likely she will move to [facility] to be close to her daughter and ex-husband. She  complains of bilateral rib pain for the past week, intermittent, worse when she coughs or moves around, she has not tried any pain medication. Mild dyspnea on exertion, she still able to go out for prostate shopping and take care of herself, she does some gental exercise such as Taiji at home.   MEDICAL HISTORY:  Past Medical History  Diagnosis Date  . Breast cancer   . Breast cancer     SURGICAL HISTORY: Past Surgical History  Procedure Laterality Date  . Mastectomy Left     2010  . Eye surgery      cataract surgery, wears contact left eye  . Subxyphoid pericardial window N/A 05/05/2014    Procedure: SUBXYPHOID PERICARDIAL WINDOW;  Surgeon: Gaye Pollack, MD;  Location: [facility] OR;  Service: Thoracic;  Laterality: N/A;  . Flexible bronchoscopy N/A 05/05/2014    Procedure: FLEXIBLE BRONCHOSCOPY;  Surgeon: Gaye Pollack, MD;  Location: [facility];  Service: Thoracic;  Laterality: N/A;  . Video bronchoscopy with endobronchial ultrasound N/A 05/05/2014    Procedure: VIDEO BRONCHOSCOPY WITH ENDOBRONCHIAL ULTRASOUND;  Surgeon: Gaye Pollack, MD;  Location: [facility];  Service: Thoracic;  Laterality: N/A;    SOCIAL HISTORY: History   Social History  . Marital Status: Single    Spouse Name: N/A  . Number of Children: N/A  . Years of Education: N/A   Occupational History  . Works PT reception    Social History Main Topics  . Smoking status: Never Smoker   . Smokeless tobacco: Never Used  . Alcohol Use: 0.0 oz/week    0 Standard drinks or equivalent per week     Comment: occ  . Drug Use: No  . Sexual Activity: Not Currently   Other Topics Concern  . Not on file   Social History Narrative    FAMILY HISTORY: Family History  Problem Relation Age of Onset  . Cancer Paternal Aunt     leukemia  . Cancer Father 60    prostate cancer     ALLERGIES:  is allergic to iopamidol; contrast media; prednisone; and zyrtec.  MEDICATIONS:  Current Outpatient Prescriptions  Medication Sig Dispense  Refill  . ALPRAZolam (XANAX) 0.5 MG tablet Take 1 tablet (0.5 mg total) by mouth 3 (three) times daily as needed for anxiety. 60 tablet 0  . oxyCODONE (OXY IR/ROXICODONE) 5 MG immediate release tablet Take 1-2 tablets (5-10 mg total) by mouth every 4 (four) hours as needed for severe pain. (Patient not taking: Reported on 10/21/2014) 30 tablet 0  . palbociclib (IBRANCE) 125 MG capsule Take 1 capsule (125 mg total) by mouth daily with breakfast. Take whole with food. Take daily x 21 days then 1 week break. (Patient not taking: Reported on 10/21/2014) 21 capsule 3  . prochlorperazine (COMPAZINE) 10 MG tablet Take 1 tablet (10 mg total) by mouth every 6 (six) hours as needed for nausea or vomiting. (Patient not taking: Reported on 08/31/2014) 30 tablet 0  . tamoxifen (NOLVADEX) 20 MG tablet Take 1 tablet (20 mg total) by mouth daily. (Patient not taking: Reported on 12/01/2014) 14 tablet 0   No current facility-administered medications for this visit.    REVIEW OF SYSTEMS:   Constitutional: Denies fevers, chills or abnormal night sweats. (+) fatigue  Eyes: Denies blurriness of vision, double vision or watery eyes Ears, nose, mouth, throat, and face:  Denies mucositis or sore throat Respiratory:cough and dyspnea has significantly improved after radiation, no wheezes Cardiovascular: (+) mid chest pain, Denies palpitation, or lower extremity swelling Gastrointestinal:  Denies nausea, heartburn or change in bowel habits Skin: Denies abnormal skin rashes Lymphatics: Denies new lymphadenopathy or easy bruising Neurological:Denies numbness, tingling or new weaknesses Behavioral/Psych: Mood is stable, no new changes  All other systems were reviewed with the patient and are negative.  PHYSICAL EXAMINATION: ECOG PERFORMANCE STATUS: 1  Filed Vitals:   12/01/14 1456  BP: 110/71  Pulse: 99  Temp: 98.8 F (37.1 C)  Resp: 18   Filed Weights   12/01/14 1456  Weight: 100 lb 9.6 oz (45.632 kg)     GENERAL:alert, no distress and comfortable SKIN: skin color, texture, turgor are normal, no rashes or significant lesions EYES: normal, conjunctiva are pink and non-injected, sclera clear OROPHARYNX:no exudate, no erythema and lips, buccal mucosa, and tongue normal  NECK: supple, thyroid normal size, non-tender, without nodularity LYMPH:  no palpable lymphadenopathy in the cervical, axillary or inguinal LUNGS: clear to auscultation and percussion with normal breathing effort HEART: regular rate & rhythm and no murmurs and no lower extremity edema ABDOMEN:abdomen soft, non-tender and normal bowel sounds Musculoskeletal:no cyanosis of digits and no clubbing  PSYCH: alert & oriented x 3 with fluent speech NEURO: no focal motor/sensory deficits  LABORATORY DATA:  I have reviewed the data as listed CBC Latest Ref Rng 11/17/2014 11/13/2014 10/21/2014  WBC 4.0 - 10.5 K/uL 5.3 4.7 4.6  Hemoglobin 12.0 - 15.0 g/dL 15.7(H) 13.8 14.0  Hematocrit 36.0 - 46.0 % 45.4 41.2 41.7  Platelets 150 - 400 K/uL 192 171 198    CMP Latest Ref Rng 11/17/2014 08/31/2014 08/25/2014  Glucose 65 - 99 mg/dL 94 114 85  BUN 6 - 20 mg/dL 11 13.3 16.5  Creatinine 0.44 - 1.00 mg/dL 0.82 0.8 0.8  Sodium 135 - 145 mmol/L 140 139 139  Potassium 3.5 - 5.1 mmol/L 4.0 4.3 4.6  Chloride 101 - 111 mmol/L 105 - -  CO2 22 - 32 mmol/L _0 Calcium 8.9 - 10.3 mg/dL 10.1 9.3 9.4  Total Protein 6.4 - 8.3 g/dL - 6.6 6.7  Total Bilirubin 0.20 - 1.20 mg/dL - 0.23 0.32  Alkaline Phos 40 - 150 U/L - 85 81  AST 5 - 34 U/L - 12 14  ALT 0 - 55 U/L - 6 <6       PATHOLOGY REPORT: Diagnosis 05/05/2014 1. Pericardium, biopsy - BENIGN MESOTHELIAL LINED FIBROADIPOSE TISSUE. 2. Endobronchial biopsy, right middle lobe - POORLY DIFFERENTIATED CARCINOMA - SEE COMMENT. 3. Endobronchial biopsy, right lower lobe - POORLY DIFFERENTIATED CARCINOMA. - SEE COMMENT. 1 of 2 FINAL for Patricia Carter, Patricia Carter ([id number]) Microscopic Comment 2. , 3.  The malignant cells are positive for cytokeratin AE1/AE3 and estrogen receptor. They are negative for cytokeratin 7, cytokeratin 20, S-100, GCDFP, CD45, chromogranin, cytokeratin 5/6, synaptophysin, TTF-1. This immunohistochemical profile is not specific. However, given the patient's history, the positive staining for estrogen receptor is most suggestive of metastatic primary breast carcinoma. A HER-2 will be attempted and the results reportedly separately. (JBK:ecj 05/11/2014)  Results: HER-2/NEU BY CISH - NO AMPLIFICATION OF HER-2 DETECTED. RESULT RATIO OF HER2: CEP 17 SIGNALS 0.94 AVERAGE HER2 COPY NUMBER PER CELL 1.50  RADIOGRAPHIC STUDIES: I have personally reviewed the radiological images as listed and agreed with the findings in the report.  CT chest 05/01/2015  IMPRESSION: 1. Negative for pulmonary embolus. 2. Right hilar mass with  compression of the SVC and right upper lobe pulmonary artery and obstruction of the right middle lobe bronchus. Right middle lobe mass and postobstructive collapse. Findings are highly worrisome for primary bronchogenic carcinoma. 3. Possible satellite right lower lobe nodule. 4. Moderate pericardial effusion. 5. Scattered small nodular and ground-glass opacities in the right upper lobe, likely infectious or inflammatory in etiology.  CT chest w contrast 07/03/2014 IMPRESSION: Interval decrease in sizes of RIGHT upper lobe mass, RIGHT hilar adenopathy and mediastinal adenopathy.  RIGHT lower lobe pulmonary nodule decreased in size with a single RIGHT apex nodule minimally increased in size since previous study.   Electronically Signed   By: Lavonia Dana M.D.   On: 07/03/2014 10:48   CT abdomen and pelvis with contrast 08/14/2014 IMPRESSION: 1. No compelling findings of metastatic disease to the abdomen. 2. Wall thickening in the descending duodenum. Although potentially from The from peristalsis, duodenitis is not excluded. Endoscopy or upper GI  fluoroscopic exam may be helpful in further characterization. 3. New ground-glass opacities in the right lower lobe and right middle lobe suggesting alveolitis or pulmonary hemorrhage. 4. Atherosclerosis. 5. Lower lumbar degenerative disc disease.  Bone scan 08/14/2014 IMPRESSION: Solitary focus of abnormal uptake is seen involving the lateral portion of a left middle rib concerning for metastatic lesion.    ASSESSMENT & PLAN:  59 year old female with past medical history of stage IIB left breast cancer, status post mastectomy and axillary lymph node dissection in 2011. Now presented with metastatic disease to lungs and lymph nodes.  1. 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 She never smoked, and immunostain does not support primary lung cancer. -We discussed the natural history of her cancer, unfortunately this is incurable at this stage, but treatable and we do have many treatment options.  -She has had good response to palliative radiation with significant symptom improvement. -PET scan was denied by her insurance. Bone scan showed a possible left lateral rib metastasis. CT of the abdomen was negative for other metastasis. -Her lung metastasis having treated with radiation with good symptomatic and radiographic response -Giving no other significant visceral metastasis, and her recent side effects from radiation, I recommend letrozole and Ibrance as the first line systemic therapy.  -She tolerated the first cycle letrozole and Ibrance poorly, although some of the symptoms could be related to her anxiety and depression. She did not tolerate anastrozole or tamoxifen either. -Given her bilateral rib pain, and she has not been on any treatment for the past 3 months, I recommend to get a repeated  CT scan and bone scan to see if she has significant disease progression. -We briefly discussed about chemotherapy if she does have disease progression  2. dizziness, anorexia, intermittent nausea -Resolved after she stopped letrozole. -She wants to try Marinol, I gave her a prescription, but is not covered by her insurance -she uses marijuana sometime   2. 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 -She has a mild dyspnea on exertion, overall her symptoms has significant improved after radiation. -Restaging CT scan in March 2016 showed significant improvement of her lung lesion, right hilar and mediastinal adenopathy. -Repeat bronchoscopy if needed.  3. 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 -May consider Xgeva when she is stable on  systemic therapy  4. Anxiety  and depression, coping and social support -Her anxiety and depression got worse due to the side effect from medical treatment  -Continue as xanax as needed. She declined antidepressant medication  -Emotional support and encouragement  5. Bilateral lateral chest pain -Possibly related to her rib and lung metastasis -We'll see what her restaging scan shows -I encouraged her to use Tylenol, ibuprofen or oxycodone as needed for pain  Plan: -CT chest, abdomen and pelvis with IV contrast, okay to omit by mouth contrast, in a few weeks -I'll see her back in 2 weeks  All questions were answered. The patient knows to call the clinic with any problems, questions or concerns. I spent 25 minutes counseling the patient face to face. The total time spent in the appointment was 30 minutes and more than 50% was on counseling.     Truitt Merle, MD 12/01/2014 5:18 PM

## 2014-12-01 NOTE — Telephone Encounter (Signed)
perp of to sch pt appt-gave pt copy of avs-adv [facility] will call to sch scan-gave contrast

## 2014-12-01 NOTE — Telephone Encounter (Signed)
OK to keep her appointment on Friday. But if she is anxious, I can see her at 2pm today. Thanks  Truitt Merle

## 2014-12-01 NOTE — Telephone Encounter (Signed)
THE PAST FIVE DAYS PT. HAS NOTICED INCREASED INTERMITTENT SHARP PAIN BILATERALLY LOWER RIB AREA AT A SCALE OF SIX. "HARDER TO BREATH MOST OF THE TIME". PT HAS AN OCCASIONAL DRY COUGH "TICKLE". SHE HAS NOT TAKEN ANY PAIN MEDICATION. SOMETIMES PT. TAKES A [place]. PT. IS "SCARE AND PANICKY BECAUSE SHE IS NOT ON ANY TREATMENT". PT. BEGINS TO CRY. SHE HAS NOT HAD ANY XANAX TODAY SO INSTRUCTED PT. TO TAKE A XANAX AND TRY TO RELAX. PT. IS SCHEDULED TO SEE DR.FENG ON Friday BUT PT. ASKED IF SHE NEEDS TO BE SOONER.

## 2014-12-04 ENCOUNTER — Other Ambulatory Visit: Payer: Medicaid Other

## 2014-12-04 ENCOUNTER — Ambulatory Visit: Payer: Medicaid Other | Admitting: Hematology

## 2014-12-08 ENCOUNTER — Other Ambulatory Visit (HOSPITAL_BASED_OUTPATIENT_CLINIC_OR_DEPARTMENT_OTHER): Payer: Medicaid Other

## 2014-12-08 DIAGNOSIS — C50912 Malignant neoplasm of unspecified site of left female breast: Secondary | ICD-10-CM

## 2014-12-08 LAB — CBC WITH DIFFERENTIAL/PLATELET
BASO%: 0.5 % (ref 0.0–2.0)
Basophils Absolute: 0 10*3/uL (ref 0.0–0.1)
EOS ABS: 0.1 10*3/uL (ref 0.0–0.5)
EOS%: 2.3 % (ref 0.0–7.0)
HCT: 41.3 % (ref 34.8–46.6)
HEMOGLOBIN: 14 g/dL (ref 11.6–15.9)
LYMPH%: 14.8 % (ref 14.0–49.7)
MCH: 28.5 pg (ref 25.1–34.0)
MCHC: 33.9 g/dL (ref 31.5–36.0)
MCV: 83.9 fL (ref 79.5–101.0)
MONO#: 0.5 10*3/uL (ref 0.1–0.9)
MONO%: 10.5 % (ref 0.0–14.0)
NEUT%: 71.9 % (ref 38.4–76.8)
NEUTROS ABS: 3.2 10*3/uL (ref 1.5–6.5)
NRBC: 0 % (ref 0–0)
PLATELETS: 143 10*3/uL — AB (ref 145–400)
RBC: 4.92 10*6/uL (ref 3.70–5.45)
RDW: 15.4 % — AB (ref 11.2–14.5)
WBC: 4.4 10*3/uL (ref 3.9–10.3)
lymph#: 0.7 10*3/uL — ABNORMAL LOW (ref 0.9–3.3)

## 2014-12-09 ENCOUNTER — Ambulatory Visit (HOSPITAL_COMMUNITY)
Admission: RE | Admit: 2014-12-09 | Discharge: 2014-12-09 | Disposition: A | Discharge status: Home / Self Care | Payer: Medicaid Other | Source: Ambulatory Visit | Attending: Hematology | Admitting: Hematology

## 2014-12-09 ENCOUNTER — Encounter (HOSPITAL_COMMUNITY): Payer: Self-pay

## 2014-12-09 ENCOUNTER — Encounter (HOSPITAL_COMMUNITY)
Admission: RE | Admit: 2014-12-09 | Discharge: 2014-12-09 | Disposition: A | Discharge status: Home / Self Care | Payer: Medicaid Other | Source: Ambulatory Visit | Attending: Hematology | Admitting: Hematology

## 2014-12-09 DIAGNOSIS — J90 Pleural effusion, not elsewhere classified: Secondary | ICD-10-CM | POA: Insufficient documentation

## 2014-12-09 DIAGNOSIS — C7800 Secondary malignant neoplasm of unspecified lung: Secondary | ICD-10-CM | POA: Insufficient documentation

## 2014-12-09 DIAGNOSIS — Z08 Encounter for follow-up examination after completed treatment for malignant neoplasm: Secondary | ICD-10-CM | POA: Diagnosis not present

## 2014-12-09 DIAGNOSIS — Z9221 Personal history of antineoplastic chemotherapy: Secondary | ICD-10-CM | POA: Insufficient documentation

## 2014-12-09 DIAGNOSIS — R530 Neoplastic (malignant) related fatigue: Secondary | ICD-10-CM

## 2014-12-09 DIAGNOSIS — R918 Other nonspecific abnormal finding of lung field: Secondary | ICD-10-CM | POA: Insufficient documentation

## 2014-12-09 DIAGNOSIS — C50919 Malignant neoplasm of unspecified site of unspecified female breast: Secondary | ICD-10-CM | POA: Insufficient documentation

## 2014-12-09 MED ORDER — IOHEXOL 300 MG/ML  SOLN
100.0000 mL | Freq: Once | INTRAMUSCULAR | Status: AC | PRN
  Administered 2014-12-09: 100 mL via INTRAVENOUS

## 2014-12-09 MED ORDER — TECHNETIUM TC 99M MEDRONATE IV KIT
27.0000 | PACK | Freq: Once | INTRAVENOUS | Status: AC | PRN
  Administered 2014-12-09: 27 via INTRAVENOUS

## 2014-12-15 ENCOUNTER — Encounter: Payer: Self-pay | Admitting: Hematology

## 2014-12-15 ENCOUNTER — Telehealth: Payer: Self-pay | Admitting: Hematology

## 2014-12-15 ENCOUNTER — Ambulatory Visit (HOSPITAL_BASED_OUTPATIENT_CLINIC_OR_DEPARTMENT_OTHER): Payer: Medicaid Other | Admitting: Hematology

## 2014-12-15 VITALS — BP 128/59 | HR 89 | Temp 97.0°F | Resp 18 | Ht 63.0 in | Wt 102.7 lb

## 2014-12-15 DIAGNOSIS — C7951 Secondary malignant neoplasm of bone: Secondary | ICD-10-CM | POA: Diagnosis not present

## 2014-12-15 DIAGNOSIS — C773 Secondary and unspecified malignant neoplasm of axilla and upper limb lymph nodes: Secondary | ICD-10-CM | POA: Diagnosis not present

## 2014-12-15 DIAGNOSIS — C50919 Malignant neoplasm of unspecified site of unspecified female breast: Secondary | ICD-10-CM

## 2014-12-15 DIAGNOSIS — F329 Major depressive disorder, single episode, unspecified: Secondary | ICD-10-CM | POA: Diagnosis not present

## 2014-12-15 DIAGNOSIS — C7800 Secondary malignant neoplasm of unspecified lung: Secondary | ICD-10-CM | POA: Diagnosis not present

## 2014-12-15 DIAGNOSIS — C50912 Malignant neoplasm of unspecified site of left female breast: Secondary | ICD-10-CM | POA: Diagnosis not present

## 2014-12-15 DIAGNOSIS — F419 Anxiety disorder, unspecified: Secondary | ICD-10-CM

## 2014-12-15 NOTE — Progress Notes (Signed)
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 Deep surgical margin were positive, pT2N1, tumor size 3.7 x 3.0 x 2.2 cm, grade 3, 2 out of 10 lymph nodes were positive for tumor cells. Patient declined adjuvant chemotherapy, radiation and endocrin therapy   04/30/2014 Progression She presented with worsening cough and dyspnea. CT on 05/01/2015 showed right hilar mass with compression of the SVC and right upper lobe pulmonary artery and obstruction of the right middle lobe bronchus. Moderate pericardial effusion   05/05/2014 Pathology Results Endobronchial biopsy of right lower lobe showed poorly differentiated adenocarcinoma, estrogen receptor positive, CK AE1/AE3 positive. Negative for CK 7, CK 20, TTF-1, S100,etc.  HER2(-). Pericardium biopsy negative.   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 CT abdomen and pelvis is negative for mets.    08/17/2014 -  Anti-estrogen oral therapy Letrozole 71m daily and palbociclib 1275mdaily 3 weeks on 1 week off, starting 08/18/2014, palbo was held for second cycle due to AEs (dizziness, fatigue and nose bleeding).      HISTORY OF PRESENTING ILLNESS:  Patricia Raspberry59o. female is here because of recently diagnosed metastatic recurrent breast cancer.  She was diagnosed with pT2 N1M0, stage IIB, left breast cancer in August 2011. She [facility], and declined adjuvant chemotherapy and endocrine therapy. She has not followed with physician regularly after that and does not have annual mammogram.  She started having dry cough and dyspnea on exertion in May 2016. She had multiple ED visit since then and was treated with multiple courses of antibiotics for bronchitis, which really did not help. In December 2015, she called the bowel pulmonary clinic and was evaluated for worsening cough and dyspnea. A CT of chest was obtained which showed a right hilar mass which cause compression on SVC and right upper lobe pulmonary artery and obstruction of right middle lobe. Moderate pericardial effusion was noticed. She underwent bronchoscopy and pericardial window on 05/01/2014. Biopsy of the right endobronchial lesion showed poorly differentiated adenoma, consistent with breast primary. Pericardial window biopsy and cytology was negative for malignancy.  She started palliative radiation on 06/10/2014, and completed on 06/29/2014, a total of 14 sessions. She tolerated well initially, but developed severe substernal chest pain and dysphagia at the last week of radiation. She still has quite a bit chest pain, is taking Bumex and oxycodone, which helps only some. She is able to eat soft food diet and adequate. She lost about 7 pounds during the radiation. Her dyspnea and cough has significantly improved after radiation.  INTERIM HISTORY: She returns for follow up. She feels well overall. She did be more active lately, has been doing some gentle exercise such as Qigong. She denies significant pain except occasional left lateral rib pain, mild dyspnea on exertion, no cough, or other new symptoms. Her appetite and at this level are fair,  she eats well. She just found out that she is qualified for low rent due to her income, and she decided to stay in the same apartment.    MEDICAL HISTORY:  Past Medical History  Diagnosis Date  . Breast cancer   . Breast cancer     SURGICAL HISTORY: Past Surgical History  Procedure Laterality Date  . Mastectomy Left     2010  . Eye surgery      cataract surgery, wears contact left eye  . Subxyphoid pericardial window N/A 05/05/2014    Procedure: SUBXYPHOID PERICARDIAL WINDOW;  Surgeon: Gaye Pollack, MD;  Location: [facility] OR;  Service: Thoracic;  Laterality: N/A;  . Flexible bronchoscopy N/A 05/05/2014    Procedure: FLEXIBLE BRONCHOSCOPY;  Surgeon: Gaye Pollack, MD;  Location: [facility];  Service: Thoracic;  Laterality: N/A;  . Video bronchoscopy with endobronchial ultrasound N/A 05/05/2014    Procedure: VIDEO BRONCHOSCOPY WITH ENDOBRONCHIAL ULTRASOUND;  Surgeon: Gaye Pollack, MD;  Location: [facility] OR;  Service: Thoracic;  Laterality: N/A;    SOCIAL HISTORY: Social History   Social History  . Marital Status: Single    Spouse Name: N/A  . Number of Children: N/A  . Years of Education: N/A   Occupational History  . Works PT reception    Social History Main Topics  . Smoking status: Never Smoker   . Smokeless tobacco: Never Used  . Alcohol Use: 0.0 oz/week    0 Standard drinks or equivalent per week     Comment: occ  . Drug Use: No  . Sexual Activity: Not Currently   Other Topics Concern  . Not on file   Social History Narrative    FAMILY HISTORY: Family History  Problem Relation Age of Onset  . Cancer Paternal Aunt     leukemia  . Cancer Father 18    prostate cancer     ALLERGIES:  is allergic to iopamidol; contrast media; prednisone; and zyrtec.  MEDICATIONS:  Current Outpatient Prescriptions  Medication Sig Dispense Refill  . ALPRAZolam (XANAX) 0.5 MG tablet Take 1 tablet (0.5 mg total) by mouth 3 (three) times daily as needed for anxiety. 60 tablet 0  . oxyCODONE (OXY IR/ROXICODONE) 5 MG immediate release tablet Take 1-2 tablets (5-10 mg total) by mouth every 4 (four) hours as needed for severe  pain. (Patient not taking: Reported on 10/21/2014) 30 tablet 0  . palbociclib (IBRANCE) 125 MG capsule Take 1 capsule (125 mg total) by mouth daily with breakfast. Take whole with food. Take daily x 21 days then 1 week break. (Patient not taking: Reported on 10/21/2014) 21 capsule 3  . prochlorperazine (COMPAZINE) 10 MG tablet Take 1 tablet (10 mg total) by mouth every 6 (six) hours as needed for nausea or vomiting. (Patient not taking: Reported on 08/31/2014) 30 tablet 0  . tamoxifen (NOLVADEX) 20 MG tablet Take 1 tablet (20 mg total) by mouth daily. (Patient not taking: Reported on 12/01/2014) 14 tablet 0   No current facility-administered medications for this visit.    REVIEW OF SYSTEMS:   Constitutional: Denies fevers, chills or abnormal night sweats. (+) fatigue  Eyes: Denies blurriness of vision, double vision or watery eyes Ears, nose, mouth, throat, and face: Denies mucositis or sore throat Respiratory:cough and dyspnea has significantly improved after radiation, no wheezes Cardiovascular: (+) mid chest pain, Denies palpitation, or lower extremity swelling Gastrointestinal:  Denies nausea, heartburn or change in bowel habits Skin: Denies abnormal skin rashes Lymphatics: Denies new lymphadenopathy or easy  bruising Neurological:Denies numbness, tingling or new weaknesses Behavioral/Psych: Mood is stable, no new changes  All other systems were reviewed with the patient and are negative.  PHYSICAL EXAMINATION: ECOG PERFORMANCE STATUS: 1  Filed Vitals:   12/15/14 0951  BP: 128/59  Pulse: 89  Temp: 97 F (36.1 C)  Resp: 18   Filed Weights   12/15/14 0951  Weight: 102 lb 11.2 oz (46.584 kg)    GENERAL:alert, no distress and comfortable SKIN: skin color, texture, turgor are normal, no rashes or significant lesions EYES: normal, conjunctiva are pink and non-injected, sclera clear OROPHARYNX:no exudate, no erythema and lips, buccal mucosa, and tongue normal  NECK: supple, thyroid  normal size, non-tender, without nodularity LYMPH:  no palpable lymphadenopathy in the cervical, axillary or inguinal LUNGS: clear to auscultation and percussion with normal breathing effort HEART: regular rate & rhythm and no murmurs and no lower extremity edema ABDOMEN:abdomen soft, non-tender and normal bowel sounds Musculoskeletal:no cyanosis of digits and no clubbing  PSYCH: alert & oriented x 3 with fluent speech NEURO: no focal motor/sensory deficits  LABORATORY DATA:  I have reviewed the data as listed CBC Latest Ref Rng 12/08/2014 11/17/2014 11/13/2014  WBC 3.9 - 10.3 10e3/uL 4.4 5.3 4.7  Hemoglobin 11.6 - 15.9 g/dL 14.0 15.7(H) 13.8  Hematocrit 34.8 - 46.6 % 41.3 45.4 41.2  Platelets 145 - 400 10e3/uL 143(L) 192 171    CMP Latest Ref Rng 11/17/2014 08/31/2014 08/25/2014  Glucose 65 - 99 mg/dL 94 114 85  BUN 6 - 20 mg/dL 11 13.3 16.5  Creatinine 0.44 - 1.00 mg/dL 0.82 0.8 0.8  Sodium 135 - 145 mmol/L 140 139 139  Potassium 3.5 - 5.1 mmol/L 4.0 4.3 4.6  Chloride 101 - 111 mmol/L 105 - -  CO2 22 - 32 mmol/L 25 28 23   Calcium 8.9 - 10.3 mg/dL 10.1 9.3 9.4  Total Protein 6.4 - 8.3 g/dL - 6.6 6.7  Total Bilirubin 0.20 - 1.20 mg/dL - 0.23 0.32  Alkaline Phos 40 - 150 U/L - 85 81  AST 5 - 34 U/L - 12 14  ALT 0 - 55 U/L - 6 <6       PATHOLOGY REPORT: Diagnosis 05/05/2014 1. Pericardium, biopsy - BENIGN MESOTHELIAL LINED FIBROADIPOSE TISSUE. 2. Endobronchial biopsy, right middle lobe - POORLY DIFFERENTIATED CARCINOMA - SEE COMMENT. 3. Endobronchial biopsy, right lower lobe - POORLY DIFFERENTIATED CARCINOMA. - SEE COMMENT. 1 of 2 FINAL for CRAIG, WISNEWSKI ([id number]) Microscopic Comment 2. , 3. The malignant cells are positive for cytokeratin AE1/AE3 and estrogen receptor. They are negative for cytokeratin 7, cytokeratin 20, S-100, GCDFP, CD45, chromogranin, cytokeratin 5/6, synaptophysin, TTF-1. This immunohistochemical profile is not specific. However, given the patient's  history, the positive staining for estrogen receptor is most suggestive of metastatic primary breast carcinoma. A HER-2 will be attempted and the results reportedly separately. (JBK:ecj 05/11/2014)  Results: HER-2/NEU BY CISH - NO AMPLIFICATION OF HER-2 DETECTED. RESULT RATIO OF HER2: CEP 17 SIGNALS 0.94 AVERAGE HER2 COPY NUMBER PER CELL 1.50  RADIOGRAPHIC STUDIES: I have personally reviewed the radiological images as listed and agreed with the findings in the report.  CT chest 05/01/2015  IMPRESSION: 1. Negative for pulmonary embolus. 2. Right hilar mass with compression of the SVC and right upper lobe pulmonary artery and obstruction of the right middle lobe bronchus. Right middle lobe mass and postobstructive collapse. Findings are highly worrisome for primary bronchogenic carcinoma. 3. Possible satellite right lower lobe nodule. 4. Moderate pericardial effusion. 5. Scattered small  nodular and ground-glass opacities in the right upper lobe, likely infectious or inflammatory in etiology.  CT chest w contrast 07/03/2014 IMPRESSION: Interval decrease in sizes of RIGHT upper lobe mass, RIGHT hilar adenopathy and mediastinal adenopathy.  RIGHT lower lobe pulmonary nodule decreased in size with a single RIGHT apex nodule minimally increased in size since previous study.   Electronically Signed   By: Lavonia Dana M.D.   On: 07/03/2014 10:48   CT abdomen and pelvis with contrast 12/09/2014 IMPRESSION: 1. Evolutionary changes of radiation therapy in the right perihilar region with decrease in size of a right middle lobe metastasis. 2. Scattered right upper lobe pulmonary nodules, some decreased in size, at least 1 is larger. 3. Small loculated right pleural effusion. 4. Focal anterior pericardial fluid, new. 5. No acute or metastatic findings in the abdomen or pelvis.  Bone scan 12/09/2014 IMPRESSION: 1. There is stable mildly increased uptake in the lateral aspects of the left fifth  and right sixth ribs. This is stable. No other abnormal rib activity is observed. Review of the bone windows from a chest CT scan of today's date reveals no rib lesions. 2. There are no findings elsewhere in the skeleton suspicious for metastatic disease.    ASSESSMENT & PLAN:  59 year old female with past medical history of stage IIB left breast cancer, status post mastectomy and axillary lymph node dissection in 2011. Now presented with metastatic disease to lungs and lymph nodes.  1. 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 She never smoked, and immunostain does not support primary lung cancer. -We discussed the natural history of her cancer, unfortunately this is incurable at this stage, but treatable and we do have many treatment options.  -She has had good response to palliative radiation with significant symptom improvement. -PET scan was denied by her insurance. Bone scan showed a possible left lateral rib metastasis. CT of the abdomen was negative for other metastasis. -Her lung metastasis having treated with radiation with good symptomatic and radiographic response -Giving no other significant visceral metastasis, and her recent side effects from radiation, I recommended letrozole and Ibrance as the first line systemic therapy.  -She tolerated the first cycle letrozole and Ibrance poorly, although some of the symptoms could be related to her anxiety and depression. She did not tolerate anastrozole or tamoxifen either. -I reviewed her recent restaging CT scan and bone scan. She has had a good response to radiation, small lung nodules are stable overall. Bone scan showed mild uptake in the lateral aspect of left fifth and the right sixth ribs, probable bone metastasis. No other significant  metastases on the scan -We discussed the treatment options, such as fulvestrant with or without eyebrows, chemotherapy with single agent paclitaxel or capecitabine. She is reluctant to take any of this treatment.  -Given her very low disease burden, clinically asymptomatic, I think it's okay to observe her for now. She agrees with the plan and feels quite relieved.  2. 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 -She has a mild dyspnea on exertion, overall her symptoms has significant improved after radiation. -Restaging CT scan in March 2016 showed significant improvement of her lung lesion, right hilar and mediastinal adenopathy. -Repeat bronchoscopy if needed.  3. 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 -May consider  Xgeva when she is stable on  systemic therapy  4. Anxiety and depression, coping and social support -Her anxiety and depression got worse due to the side effect from medical treatment  -Continue as xanax as needed. She declined antidepressant medication  -Emotional support and encouragement  Plan: -We'll continue observation for now -I'll see her back in 1 month with lab.  All questions were answered. The patient knows to call the clinic with any problems, questions or concerns. I spent 25 minutes counseling the patient face to face. The total time spent in the appointment was 30 minutes and more than 50% was on counseling.     Truitt Merle, MD 12/15/2014 7:53 AM

## 2014-12-15 NOTE — Telephone Encounter (Signed)
Gave avs & calendar for September °

## 2014-12-17 ENCOUNTER — Encounter: Payer: Self-pay | Admitting: Hematology

## 2014-12-21 ENCOUNTER — Other Ambulatory Visit: Payer: Self-pay | Admitting: Hematology

## 2014-12-21 DIAGNOSIS — C50919 Malignant neoplasm of unspecified site of unspecified female breast: Secondary | ICD-10-CM

## 2014-12-23 ENCOUNTER — Telehealth: Payer: Self-pay | Admitting: *Deleted

## 2014-12-23 NOTE — Telephone Encounter (Signed)
Patient called. Left message that her pharmacy has not heard about the request for xanax refill.  Will send note to Dr. Ernestina Penna nurse to look at request.  Called patient and let her know they will look at this tomorrow.  She has a few left and appreciated my checking on this

## 2014-12-24 NOTE — Telephone Encounter (Signed)
Please refill, thanks.  Patricia Carter

## 2015-01-02 ENCOUNTER — Emergency Department (HOSPITAL_COMMUNITY): Payer: Medicaid Other

## 2015-01-02 ENCOUNTER — Emergency Department (HOSPITAL_COMMUNITY)
Admission: EM | Admit: 2015-01-02 | Discharge: 2015-01-02 | Disposition: A | Discharge status: Home / Self Care | Payer: Medicaid Other | Attending: Emergency Medicine | Admitting: Emergency Medicine

## 2015-01-02 ENCOUNTER — Encounter (HOSPITAL_COMMUNITY): Payer: Self-pay | Admitting: Emergency Medicine

## 2015-01-02 DIAGNOSIS — R0600 Dyspnea, unspecified: Secondary | ICD-10-CM | POA: Diagnosis not present

## 2015-01-02 DIAGNOSIS — Z853 Personal history of malignant neoplasm of breast: Secondary | ICD-10-CM | POA: Diagnosis not present

## 2015-01-02 DIAGNOSIS — J90 Pleural effusion, not elsewhere classified: Secondary | ICD-10-CM | POA: Diagnosis not present

## 2015-01-02 DIAGNOSIS — F419 Anxiety disorder, unspecified: Secondary | ICD-10-CM | POA: Insufficient documentation

## 2015-01-02 DIAGNOSIS — R0602 Shortness of breath: Secondary | ICD-10-CM | POA: Diagnosis present

## 2015-01-02 LAB — CBC WITH DIFFERENTIAL/PLATELET
BASOS ABS: 0 10*3/uL (ref 0.0–0.1)
Basophils Relative: 0 % (ref 0–1)
Eosinophils Absolute: 0.1 10*3/uL (ref 0.0–0.7)
Eosinophils Relative: 1 % (ref 0–5)
HEMATOCRIT: 42.8 % (ref 36.0–46.0)
Hemoglobin: 14.7 g/dL (ref 12.0–15.0)
LYMPHS PCT: 13 % (ref 12–46)
Lymphs Abs: 0.7 10*3/uL (ref 0.7–4.0)
MCH: 28.9 pg (ref 26.0–34.0)
MCHC: 34.3 g/dL (ref 30.0–36.0)
MCV: 84.1 fL (ref 78.0–100.0)
MONO ABS: 0.6 10*3/uL (ref 0.1–1.0)
Monocytes Relative: 11 % (ref 3–12)
NEUTROS ABS: 4 10*3/uL (ref 1.7–7.7)
NEUTROS PCT: 75 % (ref 43–77)
Platelets: 185 10*3/uL (ref 150–400)
RBC: 5.09 MIL/uL (ref 3.87–5.11)
RDW: 14.1 % (ref 11.5–15.5)
WBC: 5.2 10*3/uL (ref 4.0–10.5)

## 2015-01-02 LAB — BASIC METABOLIC PANEL
ANION GAP: 8 (ref 5–15)
BUN: 15 mg/dL (ref 6–20)
CO2: 28 mmol/L (ref 22–32)
Calcium: 9.6 mg/dL (ref 8.9–10.3)
Chloride: 104 mmol/L (ref 101–111)
Creatinine, Ser: 0.8 mg/dL (ref 0.44–1.00)
GFR calc Af Amer: >60 mL/min (ref >60)
GFR calc non Af Amer: >60 mL/min (ref >60)
Glucose, Bld: 77 mg/dL (ref 65–99)
POTASSIUM: 4 mmol/L (ref 3.5–5.1)
Sodium: 140 mmol/L (ref 135–145)

## 2015-01-02 LAB — TROPONIN I: Troponin I: <0.03 ng/mL (ref <0.031)

## 2015-01-02 LAB — BRAIN NATRIURETIC PEPTIDE: B NATRIURETIC PEPTIDE 5: 28.5 pg/mL (ref 0.0–100.0)

## 2015-01-02 MED ORDER — IOHEXOL 350 MG/ML SOLN
100.0000 mL | Freq: Once | INTRAVENOUS | Status: AC | PRN
  Administered 2015-01-02: 75 mL via INTRAVENOUS

## 2015-01-02 MED ORDER — LORAZEPAM 2 MG/ML IJ SOLN
1.0000 mg | Freq: Once | INTRAMUSCULAR | Status: AC
  Administered 2015-01-02: 1 mg via INTRAVENOUS
  Filled 2015-01-02: qty 1

## 2015-01-02 NOTE — ED Notes (Signed)
Pt stated she did not want a chest x-ray performed unless it were medically necessary because she recently had a lot of imaging done a few weeks ago.

## 2015-01-02 NOTE — Discharge Instructions (Signed)
Pleural Effusion °The lining covering your lungs and the inside of your chest is called the pleura. Usually, the space between the two pleura contains no air and only a thin layer of fluid. A pleural effusion is an abnormal buildup of fluid in the pleural space. °Fluid gathers when there is increased pressure in the lung vessels. This forces fluids out of the lungs and into the pleural space. Vessels may also leak fluids when there are infections, such as pneumonia, or other causes of soreness and redness (inflammation). Fluids leak into the lungs when protein in the blood is low or when certain vessels (lymphatics) are blocked. °Finding a pleural effusion is important because it is usually caused by another disease. In order to treat a pleural effusion, your health care provider needs to find its cause. If left untreated, a large amount of fluid can build up and cause collapse of the lung. °CAUSES  °· Heart failure. °· Infections (pneumonia, tuberculosis), pulmonary embolism, pulmonary infarction. °· Cancer (primary lung and metastatic), asbestosis. °· Liver failure (cirrhosis). °· Nephrotic syndrome, peritoneal dialysis, kidney problems (uremia). °· Collagen vascular disease (systemic lupus erythematosus, rheumatoid arthritis). °· Injury (trauma) to the chest or rupture of the digestive tube (esophagus). °· Material in the chest or pleural space (hemothorax, chylothorax). °· Pancreatitis. °· Surgery. °· Drug reactions. °SYMPTOMS  °A pleural effusion can decrease the amount of space available for breathing and make you short of breath. The fluid can become infected, which may cause pain and fever. Often, the pain is worse when taking a deep breath. The underlying disease (heart failure, pneumonia, blood clot, tuberculosis, cancer) may also cause symptoms. °DIAGNOSIS  °· Your health care provider can usually tell what is wrong by talking to you (taking a history), doing an exam, and taking a routine X-ray. If the  X-ray shows fluid in your chest, often fluid is removed from your chest with a needle for testing (diagnostic thoracentesis). °· Sometimes, more specialized X-rays may be needed. °· Sometimes, a small piece of tissue is removed and examined by a specialist (biopsy). °TREATMENT  °Treatment varies based on what caused the pleural effusion. Treatments include: °· Removing as much fluid as possible using a needle (thoracentesis) to improve the cough and shortness of breath. This is a simple procedure that can be done at bedside. The risks are bleeding, infection, collapse of a lung, or low blood pressure. °· Placing a tube in the chest to drain the effusion (tube thoracostomy). This is often used when there is an infection in the fluid. This is a simple procedure that can often be done at bedside or in a clinic. The procedure may be painful. The risks are the same as using a needle to drain the fluid. The chest tube usually remains for a few days and is connected to suction to improve fluid drainage. After placement, the tube usually does not cause much discomfort. °· Surgical removal of fibrous debris in and around the pleural space (decortication). This may be done with a flexible telescope (thoracoscope) through a small or large cut (incision). This is helpful for patients who have fibrosis or scar tissue that prevents complete lung expansion. The risks are infection, blood loss, and side effects from general anesthesia. °· Sometimes, a procedure called pleurodesis is done. A chest tube is placed and the fluid is drained. Next, an agent (tetracycline, talc powder) is added to the pleural space. This causes the lung and chest wall to stick together (adhesion). This leaves no   potential space for fluid to build up. The risks include infection, blood loss, and side effects from general anesthesia. °· If the effusion is caused by infection, it may be treated with antibiotics and may improve without draining. °HOME CARE  INSTRUCTIONS  °· Take any medicines exactly as prescribed. °· Follow up with your health care provider as directed. °· Monitor your exercise capacity (the amount of walking you can do before you get short of breath). °· Do not use any tobacco products including cigarettes, chewing tobacco, or electronic cigarettes. °SEEK MEDICAL CARE IF:  °· Your exercise capacity seems to get worse or does not improve with time. °· You do not recover from your illness. °· You have drainage, redness, swelling, or pain at any incision or puncture sites. °SEEK IMMEDIATE MEDICAL CARE IF:  °· Shortness of breath or chest pain develops or gets worse. °· You have a fever. °· You develop a new cough, especially if the mucus (phlegm) is discolored. °MAKE SURE YOU:  °· Understand these instructions. °· Will watch your condition. °· Will get help right away if you are not doing well or get worse. °Document Released: 04/17/2005 Document Revised: 09/01/2013 Document Reviewed: 12/07/2006 °[employer]® Patient Information ©2015 [employer]. This information is not intended to replace advice given to you by your health care provider. Make sure you discuss any questions you have with your health care provider. ° °

## 2015-01-02 NOTE — ED Notes (Signed)
Pt states she has a hx of breast CA that has come back in the right lung and on her ribs bilaterally. Pt states over the last 3 days she's been having increasing SOB, and has been taking xanax to help with her anxiety. She says the xanax helps her breathe for about an hour, then the tightness comes back and she struggles again. Pt visibly upset, tearful during triage, lung sounds clear bilaterally with O2 sat 100% on RA.

## 2015-01-02 NOTE — ED Provider Notes (Signed)
1600 - care from Dr. Melanee Left. Awaiting CT PE study to rule out pulmonary embolus. CT shows mildly increased right pleural effusion and no pulmonary embolus. Stable for discharge. Can follow-up with her oncologist on Tuesday.  Patricia Bucy, MD 01/02/15 [phone]

## 2015-01-02 NOTE — ED Notes (Signed)
Pt reports that the ativan has helped calm her down significantly, and she feels much better at this time.  RN explained that the doctor will confer with her once CT results have been analyzed. No needs at this time.

## 2015-01-02 NOTE — ED Provider Notes (Signed)
CSN: [id number]     Arrival date & time 01/02/15  1235 History   First MD Initiated Contact with Patient 01/02/15 1317     Chief Complaint  Patient presents with  . Shortness of Breath  . Breast Cancer  . Anxiety     (Consider location/radiation/quality/duration/timing/severity/associated sxs/prior Treatment) HPI Comments:  Patient with history of breast cancer with metastasis to lung and ribs bilaterally. For the past 3 days she's been having worsening shortness of breath and bilateral rib pain. She is wondering whether this is related to anxiety or not. She's been taking Xanax intermittently but the difficulty breathing returns. Patient very anxious and tearful and difficult to obtain a history from. No hypoxia. Completed radiation in March. She is not getting chemotherapy. She had a pericardial effusion that restrained in January during her initial diagnosis. She is wondering what her shortness of breath is coming from.  Patient is a 59 y.o. female presenting with anxiety. The history is provided by the patient and a relative. The history is limited by the condition of the patient.  Anxiety Associated symptoms include shortness of breath. Pertinent negatives include no chest pain, no abdominal pain and no headaches.    Past Medical History  Diagnosis Date  . Breast cancer   . Breast cancer    Past Surgical History  Procedure Laterality Date  . Mastectomy Left     2010  . Eye surgery      cataract surgery, wears contact left eye  . Subxyphoid pericardial window N/A 05/05/2014    Procedure: SUBXYPHOID PERICARDIAL WINDOW;  Surgeon: Gaye Pollack, MD;  Location: [facility] OR;  Service: Thoracic;  Laterality: N/A;  . Flexible bronchoscopy N/A 05/05/2014    Procedure: FLEXIBLE BRONCHOSCOPY;  Surgeon: Gaye Pollack, MD;  Location: [facility] OR;  Service: Thoracic;  Laterality: N/A;  . Video bronchoscopy with endobronchial ultrasound N/A 05/05/2014    Procedure: VIDEO BRONCHOSCOPY WITH ENDOBRONCHIAL  ULTRASOUND;  Surgeon: Gaye Pollack, MD;  Location: [facility] OR;  Service: Thoracic;  Laterality: N/A;   Family History  Problem Relation Age of Onset  . Cancer Paternal Aunt     leukemia  . Cancer Father 25    prostate cancer    Social History  Substance Use Topics  . Smoking status: Never Smoker   . Smokeless tobacco: Never Used  . Alcohol Use: 0.0 oz/week    0 Standard drinks or equivalent per week     Comment: occ   OB History    No data available     Review of Systems  Constitutional: Positive for activity change and appetite change. Negative for fever.  HENT: Negative for congestion and rhinorrhea.   Eyes: Negative for visual disturbance.  Respiratory: Positive for chest tightness and shortness of breath.   Cardiovascular: Negative for chest pain and leg swelling.  Gastrointestinal: Negative for nausea, vomiting and abdominal pain.  Genitourinary: Negative for dysuria, hematuria, vaginal bleeding and vaginal discharge.  Musculoskeletal: Negative for myalgias and arthralgias.  Skin: Negative for rash.  Neurological: Negative for dizziness, weakness, light-headedness and headaches.  Psychiatric/Behavioral: The patient is nervous/anxious.   A complete 10 system review of systems was obtained and all systems are negative except as noted in the HPI and PMH.      Allergies  Iopamidol; Contrast media; Prednisone; and Zyrtec  Home Medications   Prior to Admission medications   Medication Sig Start Date End Date Taking? Authorizing Provider  ALPRAZolam (XANAX) 0.5 MG tablet TAKE 1 TABLET EVERY  8 HOURS AS NEEDED ANXIETY 12/24/14  Yes Truitt Merle, MD  naproxen sodium (ANAPROX) 220 MG tablet Take 220 mg by mouth 2 (two) times daily as needed (pain).   Yes Historical Provider, MD  oxyCODONE (OXY IR/ROXICODONE) 5 MG immediate release tablet Take 1-2 tablets (5-10 mg total) by mouth every 4 (four) hours as needed for severe pain. Patient not taking: Reported on 10/21/2014 06/29/14   Gery Pray, MD  palbociclib [facility]) 125 MG capsule Take 1 capsule (125 mg total) by mouth daily with breakfast. Take whole with food. Take daily x 21 days then 1 week break. Patient not taking: Reported on 10/21/2014 09/14/14   Truitt Merle, MD  prochlorperazine (COMPAZINE) 10 MG tablet Take 1 tablet (10 mg total) by mouth every 6 (six) hours as needed for nausea or vomiting. Patient not taking: Reported on 08/31/2014 08/25/14   Susanne Borders, NP  tamoxifen (NOLVADEX) 20 MG tablet Take 1 tablet (20 mg total) by mouth daily. Patient not taking: Reported on 12/01/2014 11/13/14   Truitt Merle, MD   BP 134/87 mmHg  Pulse 98  Temp(Src) 97.8 F (36.6 C) (Oral)  Resp 24  SpO2 100% Physical Exam  Constitutional: She is oriented to person, place, and time. She appears well-developed and well-nourished. She appears distressed.  Anxious and tearful  HENT:  Head: Normocephalic and atraumatic.  Mouth/Throat: Oropharynx is clear and moist. No oropharyngeal exudate.  Eyes: Conjunctivae and EOM are normal. Pupils are equal, round, and reactive to light.  Neck: Normal range of motion. Neck supple.  No meningismus.  Cardiovascular: Normal rate, regular rhythm, normal heart sounds and intact distal pulses.   No murmur heard. Pulmonary/Chest: Effort normal and breath sounds normal. No respiratory distress. She exhibits tenderness.   No rash. Tenderness to bilateral ribs.  No wheezing.  Abdominal: Soft. There is no tenderness. There is no rebound and no guarding.  Musculoskeletal: Normal range of motion. She exhibits no edema or tenderness.  Neurological: She is alert and oriented to person, place, and time. No cranial nerve deficit. She exhibits normal muscle tone. Coordination normal.  No ataxia on finger to nose bilaterally. No pronator drift. 5/5 strength throughout. CN 2-12 intact. Negative Romberg. Equal grip strength. Sensation intact. Gait is normal.   Skin: Skin is warm.  Psychiatric: She has a normal mood and  affect. Her behavior is normal.  Nursing note and vitals reviewed.   ED Course  Procedures (including critical care time) Labs Review Labs Reviewed  CBC WITH DIFFERENTIAL/PLATELET  BASIC METABOLIC PANEL  TROPONIN I  BRAIN NATRIURETIC PEPTIDE    Imaging Review Dg Chest 2 View  01/02/2015   CLINICAL DATA:  59 year old female with shortness of breath and known breast cancer metastatic the lungs status post radiation therapy.  EXAM: CHEST  2 VIEW  COMPARISON:  Most recent prior chest x-ray 11/17/2014; prior chest CT 12/09/2014  FINDINGS: Stable cardiac and mediastinal contours. Similar appearance of spiculated architectural distortion centered in the right hila with cicatrization of the right middle and central inferior right upper lobes. No definite new airspace consolidation, pulmonary nodule or mass. No acute osseous abnormality.  Negative for pleural effusion or pneumothorax.  IMPRESSION: Stable chest x-ray without compelling evidence of acute cardiopulmonary process.   Electronically Signed   By: Jacqulynn Cadet M.D.   On: 01/02/2015 14:14   I have personally reviewed and evaluated these images and lab results as part of my medical decision-making.   EKG Interpretation   Date/Time:  Saturday  January 02 2015 12:56:59 EDT Ventricular Rate:  89 PR Interval:  126 QRS Duration: 86 QT Interval:  385 QTC Calculation: 468 R Axis:   84 Text Interpretation:  Sinus rhythm Biatrial enlargement Borderline right  axis deviation Nonspecific ST abnormality Confirmed by Wyvonnia Dusky  MD,  Williard Keller [phone]) on 01/02/2015 1:19:48 PM      MDM   Final diagnoses:  None    rib pain, shortness of breath, history of breast cancer with metastases. No hypoxia.   EKG is stable. Chest x-ray stable. Patient with no wheezing on exam. No hypoxia.   Significant component of anxiety. Much improved after Ativan. Given patient's history of cancer, pleuritic chest pain and shortness of breath she will need  imaging to evaluate for pulmonary embolism.   Chart review shows contrast allergy but she has received contrast many times in the past. She does not like the warm sensation and gives her. She denies any difficulty breathing or swallowing.  Low suspicions for ACS. EKG unchanged. Troponin negative. Ongoing symptoms x 3days. CTPE pending at time of sign out to Dr. Mingo Amber.   Ezequiel Essex, MD 01/02/15 1750

## 2015-01-05 ENCOUNTER — Telehealth: Payer: Self-pay | Admitting: *Deleted

## 2015-01-05 ENCOUNTER — Other Ambulatory Visit: Payer: Self-pay | Admitting: Hematology

## 2015-01-05 ENCOUNTER — Telehealth: Payer: Self-pay | Admitting: Hematology

## 2015-01-05 DIAGNOSIS — C7800 Secondary malignant neoplasm of unspecified lung: Principal | ICD-10-CM

## 2015-01-05 DIAGNOSIS — C50919 Malignant neoplasm of unspecified site of unspecified female breast: Secondary | ICD-10-CM

## 2015-01-05 NOTE — Telephone Encounter (Signed)
Voicemail from patient reporting "Scary s.o.b over the weekend that ended her in ED with small amount fluid in rt. Lung and more fluid around heart."  ED released her with instructions to F/U with doctor.  Next scheduled F/U is 01-11-2015.  Return number [phone].

## 2015-01-05 NOTE — Telephone Encounter (Signed)
Called pt & informed of order for ECHO.  She states that she would like to move MD appt to sooner but would like to get ECHO 1st.  Informed pt to wait for appt for ECHO & to call back if she hasn't heard from anyone in next few days.

## 2015-01-05 NOTE — Telephone Encounter (Signed)
Please let her know that we will schedule an ECHO to look into her pericardial effusion before her next appointment on 9/12. If she wishes, we can move up her appointment with me. I will order echo  Thanks  Krista Blue

## 2015-01-05 NOTE — Telephone Encounter (Signed)
per pof to sch pt ECHO-sent Linda email to pre-cert-will sch and call pt after reply

## 2015-01-08 ENCOUNTER — Other Ambulatory Visit: Payer: Self-pay | Admitting: Hematology

## 2015-01-09 ENCOUNTER — Emergency Department (HOSPITAL_COMMUNITY)
Admission: EM | Admit: 2015-01-09 | Discharge: 2015-01-09 | Disposition: A | Discharge status: Home / Self Care | Payer: Medicaid Other | Attending: Emergency Medicine | Admitting: Emergency Medicine

## 2015-01-09 ENCOUNTER — Emergency Department (HOSPITAL_COMMUNITY): Payer: Medicaid Other

## 2015-01-09 ENCOUNTER — Encounter (HOSPITAL_COMMUNITY): Payer: Self-pay | Admitting: *Deleted

## 2015-01-09 DIAGNOSIS — Z853 Personal history of malignant neoplasm of breast: Secondary | ICD-10-CM | POA: Insufficient documentation

## 2015-01-09 DIAGNOSIS — R0600 Dyspnea, unspecified: Secondary | ICD-10-CM | POA: Diagnosis not present

## 2015-01-09 DIAGNOSIS — F419 Anxiety disorder, unspecified: Secondary | ICD-10-CM | POA: Diagnosis not present

## 2015-01-09 DIAGNOSIS — R0602 Shortness of breath: Secondary | ICD-10-CM | POA: Diagnosis present

## 2015-01-09 DIAGNOSIS — Z79899 Other long term (current) drug therapy: Secondary | ICD-10-CM | POA: Insufficient documentation

## 2015-01-09 HISTORY — DX: Other nonspecific abnormal finding of lung field: R91.8

## 2015-01-09 LAB — BASIC METABOLIC PANEL
Anion gap: 8 (ref 5–15)
BUN: 20 mg/dL (ref 6–20)
CHLORIDE: 105 mmol/L (ref 101–111)
CO2: 28 mmol/L (ref 22–32)
Calcium: 9.7 mg/dL (ref 8.9–10.3)
Creatinine, Ser: 0.83 mg/dL (ref 0.44–1.00)
GFR calc non Af Amer: >60 mL/min (ref >60)
Glucose, Bld: 84 mg/dL (ref 65–99)
Potassium: 4.3 mmol/L (ref 3.5–5.1)
SODIUM: 141 mmol/L (ref 135–145)

## 2015-01-09 LAB — CBC
HCT: 39.8 % (ref 36.0–46.0)
HEMOGLOBIN: 13.5 g/dL (ref 12.0–15.0)
MCH: 28.4 pg (ref 26.0–34.0)
MCHC: 33.9 g/dL (ref 30.0–36.0)
MCV: 83.8 fL (ref 78.0–100.0)
Platelets: 173 10*3/uL (ref 150–400)
RBC: 4.75 MIL/uL (ref 3.87–5.11)
RDW: 13.7 % (ref 11.5–15.5)
WBC: 5 10*3/uL (ref 4.0–10.5)

## 2015-01-09 LAB — TROPONIN I

## 2015-01-09 MED ORDER — LORAZEPAM 2 MG/ML IJ SOLN
0.5000 mg | Freq: Once | INTRAMUSCULAR | Status: AC
  Administered 2015-01-09: 0.5 mg via INTRAVENOUS
  Filled 2015-01-09: qty 1

## 2015-01-09 MED ORDER — LORAZEPAM 2 MG/ML IJ SOLN: 1.0000 mg | Freq: Once | INTRAMUSCULAR | Status: DC

## 2015-01-09 NOTE — ED Notes (Signed)
Bed: MV78 Expected date:  Expected time:  Means of arrival:  Comments: Anxiety  Vs lung mass

## 2015-01-09 NOTE — ED Notes (Signed)
EMS reports breast cancer, feeling anxious and SHOB, can not take deep breaths, pt has #18 LA/C. Pt recently dx with lung mass, lungs clear

## 2015-01-09 NOTE — ED Provider Notes (Signed)
CSN: [id number]     Arrival date & time 01/09/15  1308 History   First MD Initiated Contact with Patient 01/09/15 1336     Chief Complaint  Patient presents with  . Shortness of Breath  . Anxiety     HPI Patient has a history of recurrent breast cancer now staged at stage IV.  She is currently receiving chemotherapy from Dr. Burr Medico of oncology.  She presents the emergency department today complaining of anxiousness and shortness of breath.  She was found this time to have endobronchial metastatic spread of her breast cancer for which she received palliative radiation in February of this year and is now receiving the chemotherapy.  She states she lives at home alone and has no one there.  She reports that time she feels short of breath and at times she feels anxious and she has difficulty differentiating between the 2.  She is without significant complaint at this time.  She reports earlier today she was having shortness of breath.  She denies productive cough or fever or chills.  No unilateral leg swelling or arm swelling.  She denies a history of DVT or pulmonary embolism.  She denies significant pleuritic chest pain at this time.  For anxiety she's been taking Xanax which she reports does help somewhat.  She is scheduled to see her oncologist on Monday as a recent CT scan demonstrated a small amount of right-sided pleural effusion as well as a very trace amount of pericardial effusion.  Plan was for an outpatient echocardiogram and repeat evaluation by the oncologist.  She states as of yet.  Echocardiogram has not been scheduled.  She has family that lives in [place] is considering moving there however she does not want a separate from her medical team.  She does admit freely however that she is lonely and scared to be managing this and dealing with this on her own.  She thinks she would do better with family nearby.   Past Medical History  Diagnosis Date  . Breast cancer   . Breast cancer   .  Lung mass    Past Surgical History  Procedure Laterality Date  . Mastectomy Left     2010  . Eye surgery      cataract surgery, wears contact left eye  . Subxyphoid pericardial window N/A 05/05/2014    Procedure: SUBXYPHOID PERICARDIAL WINDOW;  Surgeon: Gaye Pollack, MD;  Location: [facility] OR;  Service: Thoracic;  Laterality: N/A;  . Flexible bronchoscopy N/A 05/05/2014    Procedure: FLEXIBLE BRONCHOSCOPY;  Surgeon: Gaye Pollack, MD;  Location: [facility] OR;  Service: Thoracic;  Laterality: N/A;  . Video bronchoscopy with endobronchial ultrasound N/A 05/05/2014    Procedure: VIDEO BRONCHOSCOPY WITH ENDOBRONCHIAL ULTRASOUND;  Surgeon: Gaye Pollack, MD;  Location: [facility] OR;  Service: Thoracic;  Laterality: N/A;   Family History  Problem Relation Age of Onset  . Cancer Paternal Aunt     leukemia  . Cancer Father 109    prostate cancer    Social History  Substance Use Topics  . Smoking status: Never Smoker   . Smokeless tobacco: Never Used  . Alcohol Use: 0.0 oz/week    0 Standard drinks or equivalent per week     Comment: occ   OB History    No data available     Review of Systems  All other systems reviewed and are negative.     Allergies  Iopamidol; Contrast media; Prednisone; and Zyrtec  Home  Medications   Prior to Admission medications   Medication Sig Start Date End Date Taking? Authorizing Provider  ALPRAZolam Duanne Moron) 0.5 MG tablet TAKE 1 TABLET EVERY 8 HOURS AS NEEDED ANXIETY 12/24/14  Yes Truitt Merle, MD  calcium carbonate (OS-CAL - DOSED IN MG OF ELEMENTAL CALCIUM) 1250 (500 CA) MG tablet Take 1 tablet by mouth daily with breakfast.   Yes Historical Provider, MD  Cholecalciferol (VITAMIN D3) 5000 UNITS CAPS Take 1 capsule by mouth daily with supper.   Yes Historical Provider, MD  naproxen sodium (ANAPROX) 220 MG tablet Take 220 mg by mouth 2 (two) times daily as needed (pain).   Yes Historical Provider, MD  oxyCODONE (OXY IR/ROXICODONE) 5 MG immediate release tablet Take 1-2  tablets (5-10 mg total) by mouth every 4 (four) hours as needed for severe pain. Patient not taking: Reported on 10/21/2014 06/29/14   Gery Pray, MD  palbociclib [facility]) 125 MG capsule Take 1 capsule (125 mg total) by mouth daily with breakfast. Take whole with food. Take daily x 21 days then 1 week break. Patient not taking: Reported on 10/21/2014 09/14/14   Truitt Merle, MD  prochlorperazine (COMPAZINE) 10 MG tablet Take 1 tablet (10 mg total) by mouth every 6 (six) hours as needed for nausea or vomiting. Patient not taking: Reported on 08/31/2014 08/25/14   Susanne Borders, NP  tamoxifen (NOLVADEX) 20 MG tablet Take 1 tablet (20 mg total) by mouth daily. Patient not taking: Reported on 12/01/2014 11/13/14   Truitt Merle, MD   BP 129/76 mmHg  Pulse 84  Temp(Src) 97.5 F (36.4 C) (Oral)  Resp 16  SpO2 100% Physical Exam  Constitutional: She is oriented to person, place, and time. She appears well-developed and well-nourished. No distress.  HENT:  Head: Normocephalic and atraumatic.  Eyes: EOM are normal.  Neck: Normal range of motion.  Cardiovascular: Normal rate, regular rhythm and normal heart sounds.   Pulmonary/Chest: Effort normal and breath sounds normal.  Abdominal: Soft. She exhibits no distension. There is no tenderness.  Musculoskeletal: Normal range of motion.  Neurological: She is alert and oriented to person, place, and time.  Skin: Skin is warm and dry.  Psychiatric: She has a normal mood and affect. Judgment normal.  Nursing note and vitals reviewed.   ED Course  Procedures (including critical care time) Labs Review Labs Reviewed  CBC  BASIC METABOLIC PANEL  TROPONIN I    Imaging Review Dg Chest 2 View  01/09/2015   CLINICAL DATA:  Shortness of breath.  EXAM: CHEST  2 VIEW  COMPARISON:  January 02, 2015.  FINDINGS: The heart size and mediastinal contours are within normal limits. No pneumothorax or significant pleural effusion is noted. Left lung is clear. Stable right  hilar and basilar scarring is noted with tenting of the right diaphragm. The visualized skeletal structures are unremarkable.  IMPRESSION: Stable right hilar and basilar scarring is noted. No acute cardiopulmonary abnormality seen.   Electronically Signed   By: Marijo Conception, M.D.   On: 01/09/2015 16:07   I have personally reviewed and evaluated these images and lab results as part of my medical decision-making.   EKG Interpretation   Date/Time:  Saturday January 09 2015 13:17:58 EDT Ventricular Rate:  86 PR Interval:  129 QRS Duration: 85 QT Interval:  403 QTC Calculation: 482 R Axis:   77 Text Interpretation:  Sinus rhythm Left atrial enlargement Borderline T  abnormalities, lateral leads Baseline wander in lead(s) V2 V5 V6 No  significant change was found Confirmed by Alexsus Papadopoulos  MD, Lennette Bihari ([id number]) on  01/09/2015 2:20:20 PM      MDM   Final diagnoses:  Dyspnea  Anxiety    Labs chest x-ray and EKG without significant abnormality.  Patient's vital signs are stable.  She's not hypoxic.  She is not to get.  I do not think she needs another CT scan to further evaluate.  My suspicion for pulmonary embolism is very low.  I do not believe that she has symptomatic pleural effusion or pericardial effusion at this time.  She is scheduled for close follow-up and this is a reasonable plan for this patient.  I think the patient would do better if she was closer to family.  Anxiety is different places usual medicines she is scared to be at home by herself.  She'll benefit from more family and community support.  I've asked the patient to return to the emergency department for any new or worsening symptoms.  The patient feels much better.  She was given a IV dose of benzodiazepine in the emergency department states that significantly helped her and she feels much better.  She is very comfortable with the plan.    Jola Schmidt, MD 01/10/15 [phone]

## 2015-01-11 ENCOUNTER — Other Ambulatory Visit: Payer: Self-pay | Admitting: Hematology

## 2015-01-11 ENCOUNTER — Encounter: Payer: Self-pay | Admitting: Hematology

## 2015-01-11 ENCOUNTER — Ambulatory Visit (HOSPITAL_BASED_OUTPATIENT_CLINIC_OR_DEPARTMENT_OTHER): Payer: Medicaid Other | Admitting: Hematology

## 2015-01-11 ENCOUNTER — Other Ambulatory Visit: Payer: Medicaid Other

## 2015-01-11 ENCOUNTER — Telehealth: Payer: Self-pay | Admitting: Hematology

## 2015-01-11 VITALS — BP 116/78 | HR 85 | Temp 97.6°F | Resp 17 | Ht 63.0 in | Wt 103.2 lb

## 2015-01-11 DIAGNOSIS — C50919 Malignant neoplasm of unspecified site of unspecified female breast: Secondary | ICD-10-CM

## 2015-01-11 DIAGNOSIS — F329 Major depressive disorder, single episode, unspecified: Secondary | ICD-10-CM

## 2015-01-11 DIAGNOSIS — F419 Anxiety disorder, unspecified: Secondary | ICD-10-CM

## 2015-01-11 DIAGNOSIS — C7800 Secondary malignant neoplasm of unspecified lung: Secondary | ICD-10-CM

## 2015-01-11 DIAGNOSIS — C7951 Secondary malignant neoplasm of bone: Secondary | ICD-10-CM

## 2015-01-11 DIAGNOSIS — C50912 Malignant neoplasm of unspecified site of left female breast: Secondary | ICD-10-CM

## 2015-01-11 MED ORDER — ALPRAZOLAM 0.5 MG PO TABS: ORAL_TABLET | ORAL | Status: DC

## 2015-01-11 MED ORDER — ALPRAZOLAM 0.5 MG PO TABS: 0.5000 mg | ORAL_TABLET | Freq: Three times a day (TID) | ORAL | Status: DC | PRN

## 2015-01-11 MED ORDER — LORAZEPAM 1 MG PO TABS: 1.0000 mg | ORAL_TABLET | Freq: Three times a day (TID) | ORAL | Status: DC | PRN

## 2015-01-11 NOTE — Telephone Encounter (Signed)
Gave adn printed appt sched and avs fo rpt for Sept ..Marland KitchenI sent msg to Tia Masker for [place] for echo

## 2015-01-11 NOTE — Progress Notes (Signed)
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 Deep surgical margin were positive, pT2N1, tumor size 3.7 x 3.0 x 2.2 cm, grade 3, 2 out of 10 lymph nodes were positive for tumor cells. Patient declined adjuvant chemotherapy, radiation and endocrin therapy   04/30/2014 Progression She presented with worsening cough and dyspnea. CT on 05/01/2015 showed right hilar mass with compression of the SVC and right upper lobe pulmonary artery and obstruction of the right middle lobe bronchus. Moderate pericardial effusion   05/05/2014 Pathology Results Endobronchial biopsy of right lower lobe showed poorly differentiated adenocarcinoma, estrogen receptor positive, CK AE1/AE3 positive. Negative for CK 7, CK 20, TTF-1, S100,etc.  HER2(-). Pericardium biopsy negative.   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 CT abdomen and pelvis is negative for mets.    08/17/2014 -  Anti-estrogen oral therapy Letrozole 58m daily and palbociclib 1252mdaily 3 weeks on 1 week off, starting 08/18/2014, palbo was held for second cycle due to AEs (dizziness, fatigue and nose bleeding), Letrozole was held also due to AEs.    09/30/2014 -  Anti-estrogen oral therapy She subsequently tried anastrozole and tamoxifen, had a severe side effects, such as dizziness, fatigue, mood swings, after the first few doses, could not tolerate and stopped.   12/09/2014 Imaging CT chest abdomen and pelvis showed radiation changes  in the right perihilar region was decreased in size of a right middle lobe mass, scatter right upper lobe pulmonary nodules, stable overall.   12/09/2014 Imaging Bone scan showed stable mild increased uptake in the lateral left fifth and a right sixth ribs, stable overall.     HISTORY OF PRESENTING ILLNESS:  Patricia Trivett39.o. female is here because of recently diagnosed metastatic recurrent breast cancer.  She was diagnosed with pT2 N1M0, stage IIB, left breast cancer in August 2011. [facility], and declined adjuvant chemotherapy and endocrine therapy. She has not followed with physician regularly after that and does not have annual mammogram.  She started having dry cough and dyspnea on exertion in May 2016. She had multiple ED visit since then and was treated with multiple courses of antibiotics for bronchitis, which really did not help. In December 2015, she called the bowel pulmonary clinic and was evaluated for worsening cough and dyspnea. A CT of chest was obtained which showed a right hilar mass which cause compression on SVC and right upper lobe pulmonary artery and obstruction of right middle lobe. Moderate pericardial effusion was noticed. She underwent bronchoscopy and pericardial window on 05/01/2014. Biopsy of the right endobronchial lesion showed poorly differentiated adenoma, consistent with breast primary. Pericardial window biopsy and cytology was negative for malignancy.  She started palliative radiation on 06/10/2014, and completed on 06/29/2014, a total of 14 sessions. She tolerated well initially, but developed severe substernal chest pain and dysphagia at the last week of radiation. She  still has quite a bit chest pain, is taking Bumex and oxycodone, which helps only some. She is able to eat soft food diet and adequate.  She lost about 7 pounds during the radiation. Her dyspnea and cough has significantly improved after radiation.  INTERIM HISTORY: She returns for follow up. She had multiple panic attack in the past few weeks, most time she was able to talk herself through it but she did end up in the hospital emergency room twice on September 3 and September 10. She was released to home after some imaging test and IV Ativan. She otherwise feels the same, no significant dyspnea on exertion, no other new pain, his ex-husband and daughter are still trying to find a place for her to live in [place].   MEDICAL HISTORY:  Past Medical History  Diagnosis Date  . Breast cancer   . Breast cancer   . Lung mass     SURGICAL HISTORY: Past Surgical History  Procedure Laterality Date  . Mastectomy Left     2010  . Eye surgery      cataract surgery, wears contact left eye  . Subxyphoid pericardial window N/A 05/05/2014    Procedure: SUBXYPHOID PERICARDIAL WINDOW;  Surgeon: Gaye Pollack, MD;  Location: [facility];  Service: Thoracic;  Laterality: N/A;  . Flexible bronchoscopy N/A 05/05/2014    Procedure: FLEXIBLE BRONCHOSCOPY;  Surgeon: Gaye Pollack, MD;  Location: [place];  Service: Thoracic;  Laterality: N/A;  . Video bronchoscopy with endobronchial ultrasound N/A 05/05/2014    Procedure: VIDEO BRONCHOSCOPY WITH ENDOBRONCHIAL ULTRASOUND;  Surgeon: Gaye Pollack, MD;  Location: [facility];  Service: Thoracic;  Laterality: N/A;    SOCIAL HISTORY: Social History   Social History  . Marital Status: Single    Spouse Name: N/A  . Number of Children: N/A  . Years of Education: N/A   Occupational History  . Works PT reception    Social History Main Topics  . Smoking status: Never Smoker   . Smokeless tobacco: Never Used  . Alcohol Use: 0.0 oz/week    0 Standard drinks or equivalent per week     Comment: occ  . Drug Use: No  . Sexual Activity: Not Currently   Other Topics Concern  . Not on file   Social History  Narrative    FAMILY HISTORY: Family History  Problem Relation Age of Onset  . Cancer Paternal Aunt     leukemia  . Cancer Father 27    prostate cancer     ALLERGIES:  is allergic to iopamidol; contrast media; prednisone; and zyrtec.  MEDICATIONS:  Current Outpatient Prescriptions  Medication Sig Dispense Refill  . ALPRAZolam (XANAX) 0.5 MG tablet TAKE 1 TABLET EVERY 8 HOURS AS NEEDED ANXIETY 60 tablet 0  . calcium carbonate (OS-CAL - DOSED IN MG OF ELEMENTAL CALCIUM) 1250 (500 CA) MG tablet Take 1 tablet by mouth daily with breakfast.    . Cholecalciferol (VITAMIN D3) 5000 UNITS CAPS Take 1 capsule by mouth daily with supper.    . naproxen sodium (ANAPROX) 220 MG tablet Take 220 mg by mouth 2 (two) times daily as needed (pain).    Marland Kitchen ALPRAZolam (XANAX) 0.5 MG tablet Take 1 tablet (0.5 mg total) by mouth 3 (three) times daily as needed for anxiety. 60 tablet 0  . LORazepam (ATIVAN) 1 MG tablet Take 1 tablet (1 mg total) by mouth every 8 (eight) hours as needed (panic attacks). 20 tablet 0  . oxyCODONE (OXY IR/ROXICODONE)  5 MG immediate release tablet Take 1-2 tablets (5-10 mg total) by mouth every 4 (four) hours as needed for severe pain. (Patient not taking: Reported on 10/21/2014) 30 tablet 0  . prochlorperazine (COMPAZINE) 10 MG tablet Take 1 tablet (10 mg total) by mouth every 6 (six) hours as needed for nausea or vomiting. (Patient not taking: Reported on 08/31/2014) 30 tablet 0   No current facility-administered medications for this visit.    REVIEW OF SYSTEMS:   Constitutional: Denies fevers, chills or abnormal night sweats. (+) fatigue  Eyes: Denies blurriness of vision, double vision or watery eyes Ears, nose, mouth, throat, and face: Denies mucositis or sore throat Respiratory:cough and dyspnea has significantly improved after radiation, no wheezes Cardiovascular: (+) mid chest pain, Denies palpitation, or lower extremity swelling Gastrointestinal:  Denies nausea, heartburn or  change in bowel habits Skin: Denies abnormal skin rashes Lymphatics: Denies new lymphadenopathy or easy bruising Neurological:Denies numbness, tingling or new weaknesses Behavioral/Psych: Mood is stable, no new changes  All other systems were reviewed with the patient and are negative.  PHYSICAL EXAMINATION: ECOG PERFORMANCE STATUS: 1  Filed Vitals:   01/11/15 1037  BP: 116/78  Pulse: 85  Temp: 97.6 F (36.4 C)  Resp: 17   Filed Weights   01/11/15 1037  Weight: 103 lb 3.2 oz (46.811 kg)    GENERAL:alert, no distress and comfortable SKIN: skin color, texture, turgor are normal, no rashes or significant lesions EYES: normal, conjunctiva are pink and non-injected, sclera clear OROPHARYNX:no exudate, no erythema and lips, buccal mucosa, and tongue normal  NECK: supple, thyroid normal size, non-tender, without nodularity LYMPH:  no palpable lymphadenopathy in the cervical, axillary or inguinal LUNGS: clear to auscultation and percussion with normal breathing effort HEART: regular rate & rhythm and no murmurs and no lower extremity edema ABDOMEN:abdomen soft, non-tender and normal bowel sounds Musculoskeletal:no cyanosis of digits and no clubbing  PSYCH: alert & oriented x 3 with fluent speech NEURO: no focal motor/sensory deficits  LABORATORY DATA:  I have reviewed the data as listed CBC Latest Ref Rng 01/09/2015 01/02/2015 12/08/2014  WBC 4.0 - 10.5 K/uL 5.0 5.2 4.4  Hemoglobin 12.0 - 15.0 g/dL 13.5 14.7 14.0  Hematocrit 36.0 - 46.0 % 39.8 42.8 41.3  Platelets 150 - 400 K/uL 173 185 143(L)    CMP Latest Ref Rng 01/09/2015 01/02/2015 11/17/2014  Glucose 65 - 99 mg/dL 84 77 94  BUN 6 - 20 mg/dL _0 Creatinine 0.44 - 1.00 mg/dL 0.83 0.80 0.82  Sodium 135 - 145 mmol/L 141 140 140  Potassium 3.5 - 5.1 mmol/L 4.3 4.0 4.0  Chloride 101 - 111 mmol/L 105 104 105  CO2 22 - 32 mmol/L _1 Calcium 8.9 - 10.3 mg/dL 9.7 9.6 10.1  Total Protein 6.4 - 8.3 g/dL - - -  Total  Bilirubin 0.20 - 1.20 mg/dL - - -  Alkaline Phos 40 - 150 U/L - - -  AST 5 - 34 U/L - - -  ALT 0 - 55 U/L - - -       PATHOLOGY REPORT: Diagnosis 05/05/2014 1. Pericardium, biopsy - BENIGN MESOTHELIAL LINED FIBROADIPOSE TISSUE. 2. Endobronchial biopsy, right middle lobe - POORLY DIFFERENTIATED CARCINOMA - SEE COMMENT. 3. Endobronchial biopsy, right lower lobe - POORLY DIFFERENTIATED CARCINOMA. - SEE COMMENT. 1 of 2 FINAL for ASUCENA, GALER ([id number]) Microscopic Comment 2. , 3. The malignant cells are positive for cytokeratin AE1/AE3 and estrogen receptor. They are negative for cytokeratin 7,  cytokeratin 20, S-100, GCDFP, CD45, chromogranin, cytokeratin 5/6, synaptophysin, TTF-1. This immunohistochemical profile is not specific. However, given the patient's history, the positive staining for estrogen receptor is most suggestive of metastatic primary breast carcinoma. A HER-2 will be attempted and the results reportedly separately. (JBK:ecj 05/11/2014)  Results: HER-2/NEU BY CISH - NO AMPLIFICATION OF HER-2 DETECTED. RESULT RATIO OF HER2: CEP 17 SIGNALS 0.94 AVERAGE HER2 COPY NUMBER PER CELL 1.50  RADIOGRAPHIC STUDIES: I have personally reviewed the radiological images as listed and agreed with the findings in the report.  CT chest 05/01/2015  IMPRESSION: 1. Negative for pulmonary embolus. 2. Right hilar mass with compression of the SVC and right upper lobe pulmonary artery and obstruction of the right middle lobe bronchus. Right middle lobe mass and postobstructive collapse. Findings are highly worrisome for primary bronchogenic carcinoma. 3. Possible satellite right lower lobe nodule. 4. Moderate pericardial effusion. 5. Scattered small nodular and ground-glass opacities in the right upper lobe, likely infectious or inflammatory in etiology.  CT chest w contrast 07/03/2014 IMPRESSION: Interval decrease in sizes of RIGHT upper lobe mass, RIGHT hilar adenopathy and  mediastinal adenopathy.  RIGHT lower lobe pulmonary nodule decreased in size with a single RIGHT apex nodule minimally increased in size since previous study.   Electronically Signed   By: Lavonia Dana M.D.   On: 07/03/2014 10:48   CT abdomen and pelvis with contrast 12/09/2014 IMPRESSION: 1. Evolutionary changes of radiation therapy in the right perihilar region with decrease in size of a right middle lobe metastasis. 2. Scattered right upper lobe pulmonary nodules, some decreased in size, at least 1 is larger. 3. Small loculated right pleural effusion. 4. Focal anterior pericardial fluid, new. 5. No acute or metastatic findings in the abdomen or pelvis.  Bone scan 12/09/2014 IMPRESSION: 1. There is stable mildly increased uptake in the lateral aspects of the left fifth and right sixth ribs. This is stable. No other abnormal rib activity is observed. Review of the bone windows from a chest CT scan of today's date reveals no rib lesions. 2. There are no findings elsewhere in the skeleton suspicious for metastatic disease.  CT chest 01/02/2015 IMPRESSION: 1. No evidence of acute pulmonary embolus. 2. Chronic post treatment related architectural distortion in the right lung. Areas of abnormal right lung opacity appears stable since August except for increased nodularity along the right major fissure, see #3. 3. Increased but still small layering right pleural effusion. This constellation is suspicious for progressive pleural metastases.   ASSESSMENT & PLAN:  59 year old female with past medical history of stage IIB left breast cancer, status post mastectomy and axillary lymph node dissection in 2011. Now presented with metastatic disease to lungs and lymph nodes.  1. Metastatic breast cancer to lung,  thoracic lymph nodes and left rib, ER+, HER2(-) -Although endobronchial metastasis is a unusual presentation of metastatic breast cancer, her tumor immunostain studies were supportive  for breast primary. She never smoked, and immunostain does not support primary lung cancer. -We discussed the natural history of her cancer, unfortunately this is incurable at this stage, but treatable and we do have many treatment options.  -She has had good response to palliative radiation with significant symptom improvement. -PET scan was denied by her insurance. Bone scan showed a possible left lateral rib metastasis. CT of the abdomen was negative for other metastasis. -She has tried letrozole, anastrozole, and tamoxifen, but could not tolerate. She is not willing to try fulvestrant injection at this point.  -Palliative chemotherapy  versus observation was discussed with her again, she wants to continue observation and supportive care for now.  2. Frequent panic attack -She is on the next 0.5 mg every 6-8 hours as needed, she takes about 3 tablets a day -I gave her a new prescription of Ativan 1 mg, one to 2 tablets every 8 hours as needed for panic attack.  3. 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 -She has a mild dyspnea on exertion, overall her symptoms has significant improved after radiation. -Restaging CT scan in March 2016 showed significant improvement of her lung lesion, right hilar and mediastinal adenopathy. -Repeat bronchoscopy if needed.  4.  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 -May consider Xgeva monthly. Due to her unusual reaction to medications, I have not started her Delton See yet.  4. Anxiety and depression, coping and social support -Her anxiety and depression got worse lately  -Continue as xanax as needed. She declined antidepressant medication  -Emotional support and encouragement  Plan: -She is scheduled to have a echocardiogram to evaluate pericardial effusion -I given her a new prescription of Ativan today for panic attack -I'll see her back in 2 weeks for follow-up and  symptom management.  All questions were answered. The patient knows to call the clinic with any problems, questions or concerns. I spent 25 minutes counseling the patient face to face. The total time spent in the appointment was 30 minutes and more than 50% was on counseling.     Truitt Merle, MD 01/11/2015 6:38 PM

## 2015-01-12 ENCOUNTER — Telehealth: Payer: Self-pay | Admitting: Hematology

## 2015-01-12 NOTE — Telephone Encounter (Signed)
s.w. pt and advised on echo....pt ok and aware °

## 2015-01-19 ENCOUNTER — Ambulatory Visit (HOSPITAL_COMMUNITY)
Admission: RE | Admit: 2015-01-19 | Discharge: 2015-01-19 | Disposition: A | Discharge status: Home / Self Care | Payer: Medicaid Other | Source: Ambulatory Visit | Attending: Hematology | Admitting: Hematology

## 2015-01-19 DIAGNOSIS — C50919 Malignant neoplasm of unspecified site of unspecified female breast: Secondary | ICD-10-CM | POA: Diagnosis present

## 2015-01-19 DIAGNOSIS — C7800 Secondary malignant neoplasm of unspecified lung: Secondary | ICD-10-CM | POA: Insufficient documentation

## 2015-01-19 DIAGNOSIS — R0600 Dyspnea, unspecified: Secondary | ICD-10-CM | POA: Insufficient documentation

## 2015-01-19 NOTE — Progress Notes (Signed)
  Echocardiogram 2D Echocardiogram has been performed.  Darlina Sicilian M 01/19/2015, 10:48 AM

## 2015-01-26 ENCOUNTER — Ambulatory Visit (HOSPITAL_BASED_OUTPATIENT_CLINIC_OR_DEPARTMENT_OTHER): Payer: Medicaid Other

## 2015-01-26 ENCOUNTER — Telehealth: Payer: Self-pay | Admitting: Hematology

## 2015-01-26 ENCOUNTER — Encounter: Payer: Self-pay | Admitting: Hematology

## 2015-01-26 ENCOUNTER — Ambulatory Visit (HOSPITAL_BASED_OUTPATIENT_CLINIC_OR_DEPARTMENT_OTHER): Payer: Medicaid Other | Admitting: Hematology

## 2015-01-26 VITALS — BP 105/78 | HR 91 | Temp 98.0°F | Resp 18 | Ht 63.0 in | Wt 102.9 lb

## 2015-01-26 DIAGNOSIS — Z23 Encounter for immunization: Secondary | ICD-10-CM

## 2015-01-26 DIAGNOSIS — C7800 Secondary malignant neoplasm of unspecified lung: Secondary | ICD-10-CM

## 2015-01-26 DIAGNOSIS — C50912 Malignant neoplasm of unspecified site of left female breast: Secondary | ICD-10-CM

## 2015-01-26 DIAGNOSIS — C50919 Malignant neoplasm of unspecified site of unspecified female breast: Secondary | ICD-10-CM

## 2015-01-26 DIAGNOSIS — C7802 Secondary malignant neoplasm of left lung: Principal | ICD-10-CM

## 2015-01-26 MED ORDER — INFLUENZA VAC SPLIT QUAD 0.5 ML IM SUSY
0.5000 mL | PREFILLED_SYRINGE | Freq: Once | INTRAMUSCULAR | Status: AC
  Administered 2015-01-26: 0.5 mL via INTRAMUSCULAR
  Filled 2015-01-26: qty 0.5

## 2015-01-26 NOTE — Progress Notes (Signed)
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 Deep surgical margin were positive, pT2N1, tumor size 3.7 x 3.0 x 2.2 cm, grade 3, 2 out of 10 lymph nodes were positive for tumor cells. Patient declined adjuvant chemotherapy, radiation and endocrin therapy   04/30/2014 Progression She presented with worsening cough and dyspnea. CT on 05/01/2015 showed right hilar mass with compression of the SVC and right upper lobe pulmonary artery and obstruction of the right middle lobe bronchus. Moderate pericardial effusion   05/05/2014 Pathology Results Endobronchial biopsy of right lower lobe showed poorly differentiated adenocarcinoma, estrogen receptor positive, CK AE1/AE3 positive. Negative for CK 7, CK 20, TTF-1, S100,etc.  HER2(-). Pericardium biopsy negative.   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 CT abdomen and pelvis is negative for mets.    08/17/2014 -  Anti-estrogen oral therapy Letrozole 33m daily and palbociclib 1279mdaily 3 weeks on 1 week off, starting 08/18/2014, palbo was held for second cycle due to AEs (dizziness, fatigue and nose bleeding), Letrozole was held also due to AEs.    09/30/2014 -  Anti-estrogen oral therapy She subsequently tried anastrozole and tamoxifen, had a severe side effects, such as dizziness, fatigue, mood swings, after the first few doses, could not tolerate and stopped.   12/09/2014 Imaging CT chest abdomen and pelvis showed radiation changes  in the right perihilar region was decreased in size of a right middle lobe mass, scatter right upper lobe pulmonary nodules, stable overall.   12/09/2014 Imaging Bone scan showed stable mild increased uptake in the lateral left fifth and a right sixth ribs, stable overall.     HISTORY OF PRESENTING ILLNESS:  Pat[age] female is here because of recently diagnosed metastatic recurrent breast cancer.  She was diagnosed with pT2 N1M0, stage IIB, left breast cancer in August 2011. She [facility] surgery, and declined adjuvant chemotherapy and endocrine therapy. She has not followed with physician regularly after that and does not have annual mammogram.  She started having dry cough and dyspnea on exertion in May 2016. She had multiple ED visit since then and was treated with multiple courses of antibiotics for bronchitis, which really did not help. In December 2015, she called the bowel pulmonary clinic and was evaluated for worsening cough and dyspnea. A CT of chest was obtained which showed a right hilar mass which cause compression on SVC and right upper lobe pulmonary artery and obstruction of right middle lobe. Moderate pericardial effusion was noticed. She underwent bronchoscopy and pericardial window on 05/01/2014. Biopsy of the right endobronchial lesion showed poorly differentiated adenoma, consistent with breast primary. Pericardial window biopsy and cytology was negative for malignancy.  She started palliative radiation on 06/10/2014, and completed on 06/29/2014, a total of 14 sessions. She tolerated well initially, but developed severe substernal chest pain and dysphagia at the last week of radiation. She  still has quite a bit chest pain, is taking Bumex and oxycodone, which helps only some. She is able to eat soft food diet and adequate.  She lost about 7 pounds during the radiation. Her dyspnea and cough has significantly improved after radiation.  INTERIM HISTORY: She returns for follow up. She is clinically stable. She still has intermittent anxiety attack, she has been on the next around-the-clock, most time she was able to talk herself through, and she did not go to the emergency room since I saw her last time. She has Ativan at home, but has not used it. She does have intermittent dyspnea, nonexertional, more prominent when she sits or feel anxious. She is able to ambulatory without any difficulty, no significant pain, nausea or other symptoms.   MEDICAL HISTORY:  Past Medical History  Diagnosis Date  . Breast cancer   . Breast cancer   . Lung mass     SURGICAL HISTORY: Past Surgical History  Procedure Laterality Date  . Mastectomy Left     2010  . Eye surgery      cataract surgery, wears contact left eye  . Subxyphoid pericardial window N/A 05/05/2014    Procedure: SUBXYPHOID PERICARDIAL WINDOW;  Surgeon: Gaye Pollack, MD;  Location: [facility] OR;  Service: Thoracic;  Laterality: N/A;  . Flexible bronchoscopy N/A 05/05/2014    Procedure: FLEXIBLE BRONCHOSCOPY;  Surgeon: Gaye Pollack, MD;  Location: [facility];  Service: Thoracic;  Laterality: N/A;  . Video bronchoscopy with endobronchial ultrasound N/A 05/05/2014    Procedure: VIDEO BRONCHOSCOPY WITH ENDOBRONCHIAL ULTRASOUND;  Surgeon: Gaye Pollack, MD;  Location: [facility] OR;  Service: Thoracic;  Laterality: N/A;    SOCIAL HISTORY: Social History   Social History  . Marital Status: Single    Spouse Name: N/A  . Number of Children: N/A  . Years of Education: N/A   Occupational History  . Works PT reception    Social History Main Topics  . Smoking status: Never Smoker   . Smokeless tobacco: Never Used  . Alcohol Use: 0.0 oz/week    0 Standard drinks or equivalent per week     Comment: occ  . Drug Use: No  . Sexual Activity: Not Currently   Other Topics Concern  .  Not on file   Social History Narrative    FAMILY HISTORY: Family History  Problem Relation Age of Onset  . Cancer Paternal Aunt     leukemia  . Cancer Father 67    prostate cancer     ALLERGIES:  is allergic to iopamidol; contrast media; prednisone; and zyrtec.  MEDICATIONS:  Current Outpatient Prescriptions  Medication Sig Dispense Refill  . ALPRAZolam (XANAX) 0.5 MG tablet Take 1 tablet (0.5 mg total) by mouth 3 (three) times daily as needed for anxiety. 30 tablet 0  . calcium carbonate (OS-CAL - DOSED IN MG OF ELEMENTAL CALCIUM) 1250 (500 CA) MG tablet Take 1 tablet by mouth daily with breakfast.    . Cholecalciferol (VITAMIN D3) 5000 UNITS CAPS Take 1 capsule by mouth daily with supper.    . naproxen sodium (ANAPROX) 220 MG tablet Take 220 mg by mouth 2 (two) times daily as needed (pain).    . LORazepam (ATIVAN) 1 MG tablet Take 1 tablet (1 mg total) by mouth every 8 (eight) hours as needed (panic attacks). (Patient not taking: Reported on 01/26/2015) 20 tablet 0  . oxyCODONE (OXY IR/ROXICODONE) 5 MG immediate release tablet Take 1-2 tablets (5-10 mg total) by mouth  every 4 (four) hours as needed for severe pain. (Patient not taking: Reported on 10/21/2014) 30 tablet 0  . prochlorperazine (COMPAZINE) 10 MG tablet Take 1 tablet (10 mg total) by mouth every 6 (six) hours as needed for nausea or vomiting. (Patient not taking: Reported on 08/31/2014) 30 tablet 0   Current Facility-Administered Medications  Medication Dose Route Frequency Provider Last Rate Last Dose  . Influenza vac split quadrivalent PF (FLUARIX) injection 0.5 mL  0.5 mL Intramuscular Once Truitt Merle, MD        REVIEW OF SYSTEMS:   Constitutional: Denies fevers, chills or abnormal night sweats. (+) fatigue  Eyes: Denies blurriness of vision, double vision or watery eyes Ears, nose, mouth, throat, and face: Denies mucositis or sore throat Respiratory:cough and dyspnea has significantly improved after radiation, no  wheezes Cardiovascular: (+) mid chest pain, Denies palpitation, or lower extremity swelling Gastrointestinal:  Denies nausea, heartburn or change in bowel habits Skin: Denies abnormal skin rashes Lymphatics: Denies new lymphadenopathy or easy bruising Neurological:Denies numbness, tingling or new weaknesses Behavioral/Psych: Mood is stable, no new changes  All other systems were reviewed with the patient and are negative.  PHYSICAL EXAMINATION: ECOG PERFORMANCE STATUS: 1  Filed Vitals:   01/26/15 1024  BP: 105/78  Pulse: 91  Temp: 98 F (36.7 C)  Resp: 18   Filed Weights   01/26/15 1024  Weight: 102 lb 14.4 oz (46.675 kg)    GENERAL:alert, no distress and comfortable SKIN: skin color, texture, turgor are normal, no rashes or significant lesions EYES: normal, conjunctiva are pink and non-injected, sclera clear OROPHARYNX:no exudate, no erythema and lips, buccal mucosa, and tongue normal  NECK: supple, thyroid normal size, non-tender, without nodularity LYMPH:  no palpable lymphadenopathy in the cervical, axillary or inguinal LUNGS: clear to auscultation and percussion with normal breathing effort HEART: regular rate & rhythm and no murmurs and no lower extremity edema ABDOMEN:abdomen soft, non-tender and normal bowel sounds Musculoskeletal:no cyanosis of digits and no clubbing  PSYCH: alert & oriented x 3 with fluent speech NEURO: no focal motor/sensory deficits  LABORATORY DATA:  I have reviewed the data as listed CBC Latest Ref Rng 01/09/2015 01/02/2015 12/08/2014  WBC 4.0 - 10.5 K/uL 5.0 5.2 4.4  Hemoglobin 12.0 - 15.0 g/dL 13.5 14.7 14.0  Hematocrit 36.0 - 46.0 % 39.8 42.8 41.3  Platelets 150 - 400 K/uL 173 185 143(L)    CMP Latest Ref Rng 01/09/2015 01/02/2015 11/17/2014  Glucose 65 - 99 mg/dL 84 77 94  BUN 6 - 20 mg/dL 20 15 11   Creatinine 0.44 - 1.00 mg/dL 0.83 0.80 0.82  Sodium 135 - 145 mmol/L 141 140 140  Potassium 3.5 - 5.1 mmol/L 4.3 4.0 4.0  Chloride 101 - 111  mmol/L 105 104 105  CO2 22 - 32 mmol/L 28 28 25   Calcium 8.9 - 10.3 mg/dL 9.7 9.6 10.1  Total Protein 6.4 - 8.3 g/dL - - -  Total Bilirubin 0.20 - 1.20 mg/dL - - -  Alkaline Phos 40 - 150 U/L - - -  AST 5 - 34 U/L - - -  ALT 0 - 55 U/L - - -       PATHOLOGY REPORT: Diagnosis 05/05/2014 1. Pericardium, biopsy - BENIGN MESOTHELIAL LINED FIBROADIPOSE TISSUE. 2. Endobronchial biopsy, right middle lobe - POORLY DIFFERENTIATED CARCINOMA - SEE COMMENT. 3. Endobronchial biopsy, right lower lobe - POORLY DIFFERENTIATED CARCINOMA. - SEE COMMENT. 1 of 2 FINAL for GRETHEL, ZENK ([id number]) Microscopic Comment 2. , 3. The  malignant cells are positive for cytokeratin AE1/AE3 and estrogen receptor. They are negative for cytokeratin 7, cytokeratin 20, S-100, GCDFP, CD45, chromogranin, cytokeratin 5/6, synaptophysin, TTF-1. This immunohistochemical profile is not specific. However, given the patient's history, the positive staining for estrogen receptor is most suggestive of metastatic primary breast carcinoma. A HER-2 will be attempted and the results reportedly separately. (JBK:ecj 05/11/2014)  Results: HER-2/NEU BY CISH - NO AMPLIFICATION OF HER-2 DETECTED. RESULT RATIO OF HER2: CEP 17 SIGNALS 0.94 AVERAGE HER2 COPY NUMBER PER CELL 1.50  RADIOGRAPHIC STUDIES: I have personally reviewed the radiological images as listed and agreed with the findings in the report.  CT chest 05/01/2015  IMPRESSION: 1. Negative for pulmonary embolus. 2. Right hilar mass with compression of the SVC and right upper lobe pulmonary artery and obstruction of the right middle lobe bronchus. Right middle lobe mass and postobstructive collapse. Findings are highly worrisome for primary bronchogenic carcinoma. 3. Possible satellite right lower lobe nodule. 4. Moderate pericardial effusion. 5. Scattered small nodular and ground-glass opacities in the right upper lobe, likely infectious or inflammatory in  etiology.  CT chest w contrast 07/03/2014 IMPRESSION: Interval decrease in sizes of RIGHT upper lobe mass, RIGHT hilar adenopathy and mediastinal adenopathy.  RIGHT lower lobe pulmonary nodule decreased in size with a single RIGHT apex nodule minimally increased in size since previous study.   Electronically Signed   By: Lavonia Dana M.D.   On: 07/03/2014 10:48   CT abdomen and pelvis with contrast 12/09/2014 IMPRESSION: 1. Evolutionary changes of radiation therapy in the right perihilar region with decrease in size of a right middle lobe metastasis. 2. Scattered right upper lobe pulmonary nodules, some decreased in size, at least 1 is larger. 3. Small loculated right pleural effusion. 4. Focal anterior pericardial fluid, new. 5. No acute or metastatic findings in the abdomen or pelvis.  Bone scan 12/09/2014 IMPRESSION: 1. There is stable mildly increased uptake in the lateral aspects of the left fifth and right sixth ribs. This is stable. No other abnormal rib activity is observed. Review of the bone windows from a chest CT scan of today's date reveals no rib lesions. 2. There are no findings elsewhere in the skeleton suspicious for metastatic disease.  CT chest 01/02/2015 IMPRESSION: 1. No evidence of acute pulmonary embolus. 2. Chronic post treatment related architectural distortion in the right lung. Areas of abnormal right lung opacity appears stable since August except for increased nodularity along the right major fissure, see #3. 3. Increased but still small layering right pleural effusion. This constellation is suspicious for progressive pleural metastases.  Echo 01/19/2015 Study Conclusions  - Left ventricle: The cavity size was normal. Wall thickness was normal. Systolic function was normal. The estimated ejection fraction was in the range of 55% to 60%. Wall motion was normal; there were no regional wall motion abnormalities. -Pericardium: There was no  pericardial effusion.  ASSESSMENT & PLAN:  59 year old female with past medical history of stage IIB left breast cancer, status post mastectomy and axillary lymph node dissection in 2011. Now presented with metastatic disease to lungs and lymph nodes.  1. 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 She never smoked, and immunostain does not support primary lung cancer. -We discussed the natural history of her cancer, unfortunately this is incurable at this stage, but treatable and we do have  many treatment options.  -She has had good response to palliative radiation with significant symptom improvement. -PET scan was denied by her insurance. Bone scan showed a possible left lateral rib metastasis. CT of the abdomen was negative for other metastasis. -She has tried letrozole, anastrozole, and tamoxifen, but could not tolerate.  -We reviewed her recent CT chest again, she has slightly increased pleural effusion and pulmonary nodules, likely slow disease progression.  -The treatment options including chemotherapy or fulvestrant plus hyperdense was discussed with her again. She is willing to try fulvestrant injection now, but wants to wait for her ex-husband to recover from his recent flu, and he can be with her when she starts the injection.   -I'll tentatively schedule her first fulvestrant injection with low dose 250 mg in 2 weeks. I'll probably start her Ibrance with her second fulvestrant injection   2. Frequent panic attack -She is on the next 0.5 mg every 6-8 hours as needed, she takes about 3 tablets a day -she has Ativan 1 mg, one to 2 tablets every 8 hours as needed for panic attack.  3. 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 -She has a mild dyspnea on exertion,  overall her symptoms has significant improved after radiation. -Restaging CT scan in March 2016 showed significant improvement of her lung lesion, right hilar and mediastinal adenopathy. -Repeat bronchoscopy if needed.  4.  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 -May consider Xgeva monthly. Due to her unusual reaction to medications, I have not started her Delton See yet.  4. Anxiety and depression, coping and social support -Her anxiety and depression got worse lately  -Continue as xanax as needed. She declined antidepressant medication  -Emotional support and encouragement  Plan: -Return to clinic in 2 weeks with first dose of fulvestrant injection -For shot today  All questions were answered. The patient knows to call the clinic with any problems, questions or concerns. I spent 25 minutes counseling the patient face to face. The total time spent in the appointment was 30 minutes and more than 50% was on counseling.     Truitt Merle, MD 01/26/2015 11:26 AM

## 2015-01-26 NOTE — Telephone Encounter (Signed)
per pof to sch pt appt-gave pt copy of avs °

## 2015-01-30 ENCOUNTER — Other Ambulatory Visit: Payer: Self-pay | Admitting: Hematology

## 2015-02-01 ENCOUNTER — Other Ambulatory Visit: Payer: Self-pay | Admitting: *Deleted

## 2015-02-01 ENCOUNTER — Encounter: Payer: Self-pay | Admitting: *Deleted

## 2015-02-05 ENCOUNTER — Telehealth: Payer: Self-pay | Admitting: *Deleted

## 2015-02-05 NOTE — Telephone Encounter (Signed)
Received a panic vm call from pt stating that she is feeling very panicy & wants to know when she can take her ativan after taking xanax.  She has taken xanax @ 30 min ago & panic attack getting worse.  Attempted to call pt back & received vm.  Left message that she could take ativan 1 hour after taking xanax if no response.  Discussed with Dr Burr Medico & she states that she can take it any time if panic attack is worse. Called pt back & left this message.

## 2015-02-08 ENCOUNTER — Other Ambulatory Visit: Payer: Self-pay | Admitting: Hematology

## 2015-02-09 ENCOUNTER — Telehealth: Payer: Self-pay | Admitting: *Deleted

## 2015-02-09 ENCOUNTER — Telehealth: Payer: Self-pay | Admitting: Hematology

## 2015-02-09 NOTE — Telephone Encounter (Signed)
Per 10/11 pof moved 10/12 appointments to 10/20. Left message for patient and mailed schedule.

## 2015-02-09 NOTE — Telephone Encounter (Signed)
Voicemail from patient reporting "I'm sceduled tomorrow 02-10-2015 for injection (Faslodex).  I am to be in [place] on Saturday and need to know if injection should be rescheduled for next week.  I do not have a driver.  1. Will I be okay to drive on my own?  2. Will I be okay to drive after the shot?  3. Will I feel well enough to go to [facility] after receiving this injection?  Return number [phone]."

## 2015-02-09 NOTE — Telephone Encounter (Signed)
I called her back and will reschedule her appointments to 10/20.  Truitt Merle  02/09/2015

## 2015-02-10 ENCOUNTER — Ambulatory Visit: Payer: Medicaid Other

## 2015-02-10 ENCOUNTER — Encounter: Payer: Medicaid Other | Admitting: Hematology

## 2015-02-10 ENCOUNTER — Other Ambulatory Visit: Payer: Medicaid Other

## 2015-02-10 ENCOUNTER — Telehealth: Payer: Self-pay | Admitting: *Deleted

## 2015-02-10 DIAGNOSIS — C50919 Malignant neoplasm of unspecified site of unspecified female breast: Secondary | ICD-10-CM

## 2015-02-10 DIAGNOSIS — C7800 Secondary malignant neoplasm of unspecified lung: Principal | ICD-10-CM

## 2015-02-10 NOTE — Telephone Encounter (Signed)
Received vm call from pt stating that she is coughing an awful lot & her back has been hurting for 3 days & 2 aleve didn't help & she wonders if she has pneumonia.  She also states that her throat hurts & she just doesn't feel right.   Returned call & she states no fever but chilled sometimes & cough productive of clear mucous.  Discussed with Selena Lesser NP & she suggested that pt have a 2 view CXR & see her in am.  Req pt come @ 8:30 am for CXR @ [facility] & see Selena Lesser @ 9:30 am.  She expressed understanding & was very appreciative of return call.  POF to schedulers & order placed.

## 2015-02-11 ENCOUNTER — Encounter: Payer: Self-pay | Admitting: Nurse Practitioner

## 2015-02-11 ENCOUNTER — Ambulatory Visit (HOSPITAL_COMMUNITY)
Admission: RE | Admit: 2015-02-11 | Discharge: 2015-02-11 | Disposition: A | Discharge status: Home / Self Care | Payer: Medicaid Other | Source: Ambulatory Visit | Attending: Nurse Practitioner | Admitting: Nurse Practitioner

## 2015-02-11 ENCOUNTER — Ambulatory Visit (HOSPITAL_BASED_OUTPATIENT_CLINIC_OR_DEPARTMENT_OTHER): Payer: Medicaid Other | Admitting: Nurse Practitioner

## 2015-02-11 ENCOUNTER — Ambulatory Visit (HOSPITAL_BASED_OUTPATIENT_CLINIC_OR_DEPARTMENT_OTHER): Payer: Medicaid Other

## 2015-02-11 VITALS — BP 102/63 | HR 81 | Temp 98.1°F | Resp 18 | Ht 63.0 in | Wt 104.8 lb

## 2015-02-11 DIAGNOSIS — R05 Cough: Secondary | ICD-10-CM

## 2015-02-11 DIAGNOSIS — J90 Pleural effusion, not elsewhere classified: Secondary | ICD-10-CM | POA: Insufficient documentation

## 2015-02-11 DIAGNOSIS — C7800 Secondary malignant neoplasm of unspecified lung: Secondary | ICD-10-CM

## 2015-02-11 DIAGNOSIS — F419 Anxiety disorder, unspecified: Secondary | ICD-10-CM

## 2015-02-11 DIAGNOSIS — J984 Other disorders of lung: Secondary | ICD-10-CM | POA: Diagnosis not present

## 2015-02-11 DIAGNOSIS — J9811 Atelectasis: Secondary | ICD-10-CM | POA: Insufficient documentation

## 2015-02-11 DIAGNOSIS — C7951 Secondary malignant neoplasm of bone: Secondary | ICD-10-CM

## 2015-02-11 DIAGNOSIS — J069 Acute upper respiratory infection, unspecified: Secondary | ICD-10-CM

## 2015-02-11 DIAGNOSIS — C50919 Malignant neoplasm of unspecified site of unspecified female breast: Secondary | ICD-10-CM | POA: Diagnosis present

## 2015-02-11 DIAGNOSIS — R0602 Shortness of breath: Secondary | ICD-10-CM | POA: Diagnosis present

## 2015-02-11 DIAGNOSIS — R059 Cough, unspecified: Secondary | ICD-10-CM

## 2015-02-11 LAB — CBC WITH DIFFERENTIAL/PLATELET
BASO%: 0.4 % (ref 0.0–2.0)
Basophils Absolute: 0 10*3/uL (ref 0.0–0.1)
EOS%: 1.2 % (ref 0.0–7.0)
Eosinophils Absolute: 0.1 10*3/uL (ref 0.0–0.5)
HEMATOCRIT: 40.3 % (ref 34.8–46.6)
HEMOGLOBIN: 13.7 g/dL (ref 11.6–15.9)
LYMPH#: 0.5 10*3/uL — AB (ref 0.9–3.3)
LYMPH%: 12.1 % — ABNORMAL LOW (ref 14.0–49.7)
MCH: 28.5 pg (ref 25.1–34.0)
MCHC: 33.9 g/dL (ref 31.5–36.0)
MCV: 84 fL (ref 79.5–101.0)
MONO#: 0.5 10*3/uL (ref 0.1–0.9)
MONO%: 12 % (ref 0.0–14.0)
NEUT#: 3.2 10*3/uL (ref 1.5–6.5)
NEUT%: 74.3 % (ref 38.4–76.8)
Platelets: 212 10*3/uL (ref 145–400)
RBC: 4.79 10*6/uL (ref 3.70–5.45)
RDW: 13.9 % (ref 11.2–14.5)
WBC: 4.3 10*3/uL (ref 3.9–10.3)

## 2015-02-11 NOTE — Assessment & Plan Note (Signed)
Patient is scheduled to initiate Faslodex injections on 02/18/2015.  She will also initiate Ibrance oral therapy shortly thereafter.  Patient is scheduled to return on 02/18/2015 for labs, follow up visit, and her first injection.

## 2015-02-11 NOTE — Progress Notes (Signed)
SYMPTOM MANAGEMENT CLINIC   HPI: Patricia Carter 59 y.o. female diagnosed with breast cancer; with both lung and bone metastasis.  Scheduled to initiate Faslodex injections and Ibrance oral therapy this month.    Patient reports clear runny nose, mild sore throat, and a nonproductive cough on occasion only.  She denies any recent fevers or chills.  Patient was noted to have a small right pleural effusion with her last scan results.  HPI  ROS  Past Medical History  Diagnosis Date  . Breast cancer ([place])   . Breast cancer ([place])   . Lung mass     Past Surgical History  Procedure Laterality Date  . Mastectomy Left     2010  . Eye surgery      cataract surgery, wears contact left eye  . Subxyphoid pericardial window N/A 05/05/2014    Procedure: SUBXYPHOID PERICARDIAL WINDOW;  Surgeon: Gaye Pollack, MD;  Location: [facility] OR;  Service: Thoracic;  Laterality: N/A;  . Flexible bronchoscopy N/A 05/05/2014    Procedure: FLEXIBLE BRONCHOSCOPY;  Surgeon: Gaye Pollack, MD;  Location: [place];  Service: Thoracic;  Laterality: N/A;  . Video bronchoscopy with endobronchial ultrasound N/A 05/05/2014    Procedure: VIDEO BRONCHOSCOPY WITH ENDOBRONCHIAL ULTRASOUND;  Surgeon: Gaye Pollack, MD;  Location: [place];  Service: Thoracic;  Laterality: N/A;    has Dyspnea; Lung mass; Acute pericardial effusion; Breast cancer metastasized to lung [facility]); Nausea without vomiting; Neoplastic malignant related fatigue; Anxiety; and URI (upper respiratory infection) on her problem list.    is allergic to iopamidol; contrast media; prednisone; and zyrtec.    Medication List       This list is accurate as of: 02/11/15 11:20 AM.  Always use your most recent med list.               ALPRAZolam 0.5 MG tablet  Commonly known as:  XANAX  TAKE 1 TABLET BY MOUTH 3 TIMES DAILY AS NEEDED FOR ANXIETY     calcium carbonate 1250 (500 CA) MG tablet  Commonly known as:  OS-CAL - dosed in mg of elemental calcium  Take 1  tablet by mouth daily with breakfast.     LORazepam 1 MG tablet  Commonly known as:  ATIVAN  Take 1 tablet (1 mg total) by mouth every 8 (eight) hours as needed (panic attacks).     naproxen sodium 220 MG tablet  Commonly known as:  ANAPROX  Take 220 mg by mouth 2 (two) times daily as needed (pain).     oxyCODONE 5 MG immediate release tablet  Commonly known as:  Oxy IR/ROXICODONE  Take 1-2 tablets (5-10 mg total) by mouth every 4 (four) hours as needed for severe pain.     prochlorperazine 10 MG tablet  Commonly known as:  COMPAZINE  Take 1 tablet (10 mg total) by mouth every 6 (six) hours as needed for nausea or vomiting.     Vitamin D3 5000 UNITS Caps  Take 1 capsule by mouth daily with supper.         PHYSICAL EXAMINATION  Oncology Vitals 02/11/2015 01/26/2015 01/11/2015 01/09/2015 01/09/2015 01/09/2015 01/02/2015  Height 160 cm 160 cm 160 cm - - - -  Weight 47.537 kg 46.675 kg 46.811 kg - - - -  Weight (lbs) 104 lbs 13 oz 102 lbs 14 oz 103 lbs 3 oz - - - -  BMI (kg/m2) 18.56 kg/m2 18.23 kg/m2 18.28 kg/m2 - - - -  Temp 98.1 98 97.6  97.9 - 97.5 -  Pulse 81 91 85 77 75 84 81  Resp 18 18 17 12 16 16 21   SpO2 100 100 100 97 100 100 96  BSA (m2) 1.45 m2 1.44 m2 1.44 m2 - - - -   BP Readings from Last 3 Encounters:  02/11/15 102/63  01/26/15 105/78  01/11/15 116/78    Physical Exam  Constitutional: She is oriented to person, place, and time and well-developed, well-nourished, and in no distress.  HENT:  Head: Normocephalic and atraumatic.  Mouth/Throat: Oropharynx is clear and moist. No oropharyngeal exudate.  No nasal congestion or facial tenderness with palpation.  Eyes: Conjunctivae and EOM are normal. Pupils are equal, round, and reactive to light. Right eye exhibits no discharge. Left eye exhibits no discharge. No scleral icterus.  Neck: Normal range of motion. Neck supple. No JVD present. No tracheal deviation present. No thyromegaly present.  Cardiovascular: Normal  rate, regular rhythm, normal heart sounds and intact distal pulses.   Pulmonary/Chest: Effort normal and breath sounds normal. No respiratory distress. She has no wheezes. She has no rales. She exhibits no tenderness.  Abdominal: Soft. Bowel sounds are normal. She exhibits no distension and no mass. There is no tenderness. There is no rebound and no guarding.  Musculoskeletal: Normal range of motion. She exhibits no edema or tenderness.  Lymphadenopathy:    She has no cervical adenopathy.  Neurological: She is alert and oriented to person, place, and time. Gait normal.  Skin: Skin is warm and dry. No rash noted. No erythema. No pallor.  Psychiatric:  Slightly anxious.  Nursing note and vitals reviewed.   LABORATORY DATA:. Appointment on 02/11/2015  Component Date Value Ref Range Status  . WBC 02/11/2015 4.3  3.9 - 10.3 10e3/uL Final  . NEUT# 02/11/2015 3.2  1.5 - 6.5 10e3/uL Final  . HGB 02/11/2015 13.7  11.6 - 15.9 g/dL Final  . HCT 02/11/2015 40.3  34.8 - 46.6 % Final  . Platelets 02/11/2015 212  145 - 400 10e3/uL Final  . MCV 02/11/2015 84.0  79.5 - 101.0 fL Final  . MCH 02/11/2015 28.5  25.1 - 34.0 pg Final  . MCHC 02/11/2015 33.9  31.5 - 36.0 g/dL Final  . RBC 02/11/2015 4.79  3.70 - 5.45 10e6/uL Final  . RDW 02/11/2015 13.9  11.2 - 14.5 % Final  . lymph# 02/11/2015 0.5* 0.9 - 3.3 10e3/uL Final  . MONO# 02/11/2015 0.5  0.1 - 0.9 10e3/uL Final  . Eosinophils Absolute 02/11/2015 0.1  0.0 - 0.5 10e3/uL Final  . Basophils Absolute 02/11/2015 0.0  0.0 - 0.1 10e3/uL Final  . NEUT% 02/11/2015 74.3  38.4 - 76.8 % Final  . LYMPH% 02/11/2015 12.1* 14.0 - 49.7 % Final  . MONO% 02/11/2015 12.0  0.0 - 14.0 % Final  . EOS% 02/11/2015 1.2  0.0 - 7.0 % Final  . BASO% 02/11/2015 0.4  0.0 - 2.0 % Final     RADIOGRAPHIC STUDIES: Dg Chest 2 View  02/11/2015  CLINICAL DATA:  Shortness of breath. Chest pain. History of breast cancer. EXAM: CHEST  2 VIEW COMPARISON:  01/09/2015 chest x-ray and  01/02/2015 chest CT FINDINGS: The cardiac silhouette, mediastinal and hilar contours are stable. Stable extensive scarring changes in the right perihilar region. Persistent right-sided pleural effusion and overlying atelectasis. The left lung remains relatively clear. IMPRESSION: Stable scarring changes involving the right lung likely due to radiation therapy. Persistent small right effusion and overlying atelectasis. Electronically Signed   By: Mamie Nick.  Gallerani M.D.   On: 02/11/2015 08:55    ASSESSMENT/PLAN:    Breast cancer metastasized to lung Patient is scheduled to initiate Faslodex injections on 02/18/2015.  She will also initiate Ibrance oral therapy shortly thereafter.  Patient is scheduled to return on 02/18/2015 for labs, follow up visit, and her first injection.  Anxiety Patient has a history of chronic anxiety and panic attacks.  Patient states she continues with anxiety; and typically takes Xanax on an every 8 hour basis.  She reports that her daughter and son-in-law are building a house in [place], [place]; with the plan for her to move in with them as soon as her house is done.  She states that she feels more anxious when she is living alone.  Patient states she drove herself to the [place] today; and it causes increased anxiety when she has to drive in the city.  URI (upper respiratory infection) Patient reports clear runny nose, mild sore throat, and a nonproductive cough on occasion only.  She denies any recent fevers or chills.  Patient was noted to have a small right pleural effusion with her last scan results.  On exam.  Patient with clear breath sounds bilaterally.  There is no cough or wheeze noted.  There is also no shortness of breath or distress.  Patient was afebrile while at the [place]; and O2 sat was 100% on room air.  Chest x-ray obtained today revealed a persistent small right pleural effusion; but otherwise normal.  Patient states that she is  intolerant of almost any over-the-counter cold/cough medications; and prefers to withhold from any prescription medication at this point.  She prefers to try ginger, women, and honey tea.   Advised patient to let us know for systems persist or worsen; or if she develops any fevers or chills.  Patient stated understanding of all instructions; and was in agreement with this plan of care. The patient knows to call the clinic with any problems, questions or concerns.   Review/collaboration with Dr. Burr Medico regarding all aspects of patient's visit today.   Total time spent with patient was 25 minutes;  with greater than 75 percent of that time spent in face to face counseling regarding patient's symptoms,  and coordination of care and follow up.  Disclaimer:This dictation was prepared with Dragon/digital dictation along with [employer]. Any transcriptional errors that result from this process are unintentional.  Drue Second, NP 02/11/2015

## 2015-02-11 NOTE — Assessment & Plan Note (Signed)
Patient has a history of chronic anxiety and panic attacks.  Patient states she continues with anxiety; and typically takes Xanax on an every 8 hour basis.  She reports that her daughter and son-in-law are building a house in [place], [place]; with the plan for her to move in with them as soon as her house is done.  She states that she feels more anxious when she is living alone.  Patient states she drove herself to the [place] today; and it causes increased anxiety when she has to drive in the city.

## 2015-02-11 NOTE — Assessment & Plan Note (Signed)
Patient reports clear runny nose, mild sore throat, and a nonproductive cough on occasion only.  She denies any recent fevers or chills.  Patient was noted to have a small right pleural effusion with her last scan results.  On exam.  Patient with clear breath sounds bilaterally.  There is no cough or wheeze noted.  There is also no shortness of breath or distress.  Patient was afebrile while at the [place]; and O2 sat was 100% on room air.  Chest x-ray obtained today revealed a persistent small right pleural effusion; but otherwise normal.  Patient states that she is intolerant of almost any over-the-counter cold/cough medications; and prefers to withhold from any prescription medication at this point.  She prefers to try ginger, women, and honey tea.   Advised patient to let us know for systems persist or worsen; or if she develops any fevers or chills.

## 2015-02-16 NOTE — Progress Notes (Signed)
This encounter was created in error - please disregard.

## 2015-02-17 ENCOUNTER — Other Ambulatory Visit: Payer: Self-pay | Admitting: *Deleted

## 2015-02-17 ENCOUNTER — Telehealth: Payer: Self-pay | Admitting: *Deleted

## 2015-02-17 NOTE — Telephone Encounter (Signed)
Patient currently has a toothache and would like to get checked out by dentist. Patient states that since she starts falsodex tomorrow and would like confirmation that she can be seen by dentist. Message sent to RN Thu/MD Burr Medico

## 2015-02-17 NOTE — Telephone Encounter (Signed)
Yes, dental visit and treatment is fine, please let her know, thanks.  Truitt Merle  02/17/2015

## 2015-02-18 ENCOUNTER — Telehealth: Payer: Self-pay | Admitting: Hematology

## 2015-02-18 ENCOUNTER — Ambulatory Visit (HOSPITAL_BASED_OUTPATIENT_CLINIC_OR_DEPARTMENT_OTHER): Payer: Medicaid Other | Admitting: Hematology

## 2015-02-18 ENCOUNTER — Ambulatory Visit (HOSPITAL_BASED_OUTPATIENT_CLINIC_OR_DEPARTMENT_OTHER): Payer: Medicaid Other

## 2015-02-18 ENCOUNTER — Other Ambulatory Visit (HOSPITAL_BASED_OUTPATIENT_CLINIC_OR_DEPARTMENT_OTHER): Payer: Medicaid Other

## 2015-02-18 ENCOUNTER — Encounter: Payer: Self-pay | Admitting: Hematology

## 2015-02-18 VITALS — BP 106/68 | HR 86 | Temp 98.6°F | Resp 18 | Ht 63.0 in | Wt 105.1 lb

## 2015-02-18 DIAGNOSIS — F419 Anxiety disorder, unspecified: Secondary | ICD-10-CM

## 2015-02-18 DIAGNOSIS — C50919 Malignant neoplasm of unspecified site of unspecified female breast: Secondary | ICD-10-CM | POA: Diagnosis not present

## 2015-02-18 DIAGNOSIS — C7800 Secondary malignant neoplasm of unspecified lung: Secondary | ICD-10-CM | POA: Diagnosis not present

## 2015-02-18 DIAGNOSIS — G4700 Insomnia, unspecified: Secondary | ICD-10-CM

## 2015-02-18 DIAGNOSIS — C50912 Malignant neoplasm of unspecified site of left female breast: Secondary | ICD-10-CM

## 2015-02-18 DIAGNOSIS — F329 Major depressive disorder, single episode, unspecified: Secondary | ICD-10-CM | POA: Diagnosis not present

## 2015-02-18 DIAGNOSIS — F410 Panic disorder [episodic paroxysmal anxiety] without agoraphobia: Secondary | ICD-10-CM

## 2015-02-18 DIAGNOSIS — C7951 Secondary malignant neoplasm of bone: Secondary | ICD-10-CM | POA: Diagnosis not present

## 2015-02-18 DIAGNOSIS — Z5111 Encounter for antineoplastic chemotherapy: Secondary | ICD-10-CM | POA: Diagnosis not present

## 2015-02-18 DIAGNOSIS — C771 Secondary and unspecified malignant neoplasm of intrathoracic lymph nodes: Secondary | ICD-10-CM | POA: Diagnosis not present

## 2015-02-18 LAB — COMPREHENSIVE METABOLIC PANEL (CC13)
ALBUMIN: 4.1 g/dL (ref 3.5–5.0)
ALK PHOS: 86 U/L (ref 40–150)
ALT: 11 U/L (ref 0–55)
ANION GAP: 7 meq/L (ref 3–11)
AST: 21 U/L (ref 5–34)
BUN: 16.3 mg/dL (ref 7.0–26.0)
CALCIUM: 9.7 mg/dL (ref 8.4–10.4)
CO2: 29 mEq/L (ref 22–29)
Chloride: 105 mEq/L (ref 98–109)
Creatinine: 0.9 mg/dL (ref 0.6–1.1)
EGFR: 68 mL/min/{1.73_m2} — AB (ref >90)
Glucose: 80 mg/dl (ref 70–140)
POTASSIUM: 4.6 meq/L (ref 3.5–5.1)
Sodium: 142 mEq/L (ref 136–145)
Total Bilirubin: 0.33 mg/dL (ref 0.20–1.20)
Total Protein: 6.8 g/dL (ref 6.4–8.3)

## 2015-02-18 LAB — CBC WITH DIFFERENTIAL/PLATELET
BASO%: 0.5 % (ref 0.0–2.0)
BASOS ABS: 0 10*3/uL (ref 0.0–0.1)
EOS ABS: 0 10*3/uL (ref 0.0–0.5)
EOS%: 1 % (ref 0.0–7.0)
HEMATOCRIT: 39.8 % (ref 34.8–46.6)
HEMOGLOBIN: 13.6 g/dL (ref 11.6–15.9)
LYMPH#: 0.6 10*3/uL — AB (ref 0.9–3.3)
LYMPH%: 12.7 % — ABNORMAL LOW (ref 14.0–49.7)
MCH: 28.8 pg (ref 25.1–34.0)
MCHC: 34.1 g/dL (ref 31.5–36.0)
MCV: 84.5 fL (ref 79.5–101.0)
MONO#: 0.6 10*3/uL (ref 0.1–0.9)
MONO%: 12.2 % (ref 0.0–14.0)
NEUT#: 3.4 10*3/uL (ref 1.5–6.5)
NEUT%: 73.6 % (ref 38.4–76.8)
Platelets: 221 10*3/uL (ref 145–400)
RBC: 4.71 10*6/uL (ref 3.70–5.45)
RDW: 14.1 % (ref 11.2–14.5)
WBC: 4.6 10*3/uL (ref 3.9–10.3)

## 2015-02-18 MED ORDER — FULVESTRANT 250 MG/5ML IM SOLN
250.0000 mg | INTRAMUSCULAR | Status: DC
  Administered 2015-02-18: 250 mg via INTRAMUSCULAR
  Filled 2015-02-18: qty 5

## 2015-02-18 NOTE — Progress Notes (Signed)
[place]  Telephone:([phone]) [phone] Fax:(336) [phone]  Clinic Follow Up Note   Patient Care Team: Pcp Not In System as PCP - [facility] 02/18/2015  CHIEF COMPLAINTS:  Recurrent breast cancer    Breast cancer metastasized to lung [facility])   12/23/2009 Surgery left mastectomy with axillary node dissection. Deep surgical margin were positive, pT2N1, tumor size 3.7 x 3.0 x 2.2 cm, grade 3, 2 out of 10 lymph nodes were positive for tumor cells. Patient declined adjuvant chemotherapy, radiation and endocrin therapy   04/30/2014 Progression She presented with worsening cough and dyspnea. CT on 05/01/2015 showed right hilar mass with compression of the SVC and right upper lobe pulmonary artery and obstruction of the right middle lobe bronchus. Moderate pericardial effusion   05/05/2014 Pathology Results Endobronchial biopsy of right lower lobe showed poorly differentiated adenocarcinoma, estrogen receptor positive, CK AE1/AE3 positive. Negative for CK 7, CK 20, TTF-1, S100,etc.  HER2(-). Pericardium biopsy negative.   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 CT abdomen and pelvis is negative for mets.    08/17/2014 -  Anti-estrogen oral therapy Letrozole 66m daily and palbociclib 1289mdaily 3 weeks on 1 week off, starting 08/18/2014, palbo was held for second cycle due to AEs (dizziness, fatigue and nose bleeding), Letrozole was held also due to AEs.    09/30/2014 -  Anti-estrogen oral therapy She subsequently tried anastrozole and tamoxifen, had a severe side effects, such as dizziness, fatigue, mood swings, after the first few doses, could not tolerate and stopped.   12/09/2014 Imaging CT chest abdomen and pelvis showed radiation  changes in the right perihilar region was decreased in size of a right middle lobe mass, scatter right upper lobe pulmonary nodules, stable overall.   12/09/2014 Imaging Bone scan showed stable mild increased uptake in the lateral left fifth and a right sixth ribs, stable overall.     HISTORY OF PRESENTING ILLNESS:  Patricia Ruben32.o. female is here because of recently diagnosed metastatic recurrent breast cancer.  She was diagnosed with pT2 N1M0, stage IIB, left breast cancer in August 2011. [facility] surgery, and declined adjuvant chemotherapy and endocrine therapy. She has not followed with physician regularly after that and does not have annual mammogram.  She started having dry cough and dyspnea on exertion in May 2016. She had multiple ED visit since then and was treated with multiple courses of antibiotics for bronchitis, which really did not help. In December 2015, she called the bowel pulmonary clinic and was evaluated for worsening cough and dyspnea. A CT of chest was obtained which showed a right hilar mass which cause compression on SVC and right upper lobe pulmonary artery and obstruction of right middle lobe. Moderate pericardial effusion was noticed. She underwent bronchoscopy and pericardial window on 05/01/2014. Biopsy of the right endobronchial lesion showed poorly differentiated adenoma, consistent with breast primary. Pericardial window biopsy and cytology was negative for malignancy.  She started palliative radiation on 06/10/2014, and completed on 06/29/2014, a total of 14 sessions. She tolerated well initially, but developed severe substernal chest pain and dysphagia at the last week of radiation.  She still has quite a bit chest pain, is taking Bumex and oxycodone, which helps only some. She is able to eat soft food diet and  adequate. She lost about 7 pounds during the radiation. Her dyspnea and cough has significantly improved after radiation.  CURRENT TREATMENT: Fulvestrant 500 mg monthly, started with first 250 mg im on 02/18/15  INTERIM HISTORY: She returns for follow up and first fulvestrant injection. Her anxiety is slightly worse daily, she has difficult time to sleep at night. She also noticed orthopnea, she uses multiple pillows, she can sleep well at a certain angle and position. She is requesting if she can get a hospital bed at home. She is quite nervous about her her first injection today. She came in with a friend, who is can stay with her at home today. She also notices slightly worsening of right upper molar pain, and wants me to refer her to a dentist. No other new complaints  MEDICAL HISTORY:  Past Medical History  Diagnosis Date  . Breast cancer ([place])   . Breast cancer ([place])   . Lung mass     SURGICAL HISTORY: Past Surgical History  Procedure Laterality Date  . Mastectomy Left     2010  . Eye surgery      cataract surgery, wears contact left eye  . Subxyphoid pericardial window N/A 05/05/2014    Procedure: SUBXYPHOID PERICARDIAL WINDOW;  Surgeon: Gaye Pollack, MD;  Location: [facility] OR;  Service: Thoracic;  Laterality: N/A;  . Flexible bronchoscopy N/A 05/05/2014    Procedure: FLEXIBLE BRONCHOSCOPY;  Surgeon: Gaye Pollack, MD;  Location: [place];  Service: Thoracic;  Laterality: N/A;  . Video bronchoscopy with endobronchial ultrasound N/A 05/05/2014    Procedure: VIDEO BRONCHOSCOPY WITH ENDOBRONCHIAL ULTRASOUND;  Surgeon: Gaye Pollack, MD;  Location: [place] OR;  Service: Thoracic;  Laterality: N/A;    SOCIAL HISTORY: Social History   Social History  . Marital Status: Single    Spouse Name: N/A  . Number of Children: N/A  . Years of Education: N/A   Occupational History  . Works PT reception    Social History Main Topics  . Smoking status: Never Smoker   . Smokeless tobacco: Never Used    . Alcohol Use: 0.0 oz/week    0 Standard drinks or equivalent per week     Comment: occ  . Drug Use: No  . Sexual Activity: Not Currently   Other Topics Concern  . Not on file   Social History Narrative    FAMILY HISTORY: Family History  Problem Relation Age of Onset  . Cancer Paternal Aunt     leukemia  . Cancer Father 91    prostate cancer     ALLERGIES:  is allergic to iopamidol; contrast media; prednisone; and zyrtec.  MEDICATIONS:  Current Outpatient Prescriptions  Medication Sig Dispense Refill  . ALPRAZolam (XANAX) 0.5 MG tablet TAKE 1 TABLET BY MOUTH 3 TIMES DAILY AS NEEDED FOR ANXIETY 60 tablet 0  . calcium carbonate (OS-CAL - DOSED IN MG OF ELEMENTAL CALCIUM) 1250 (500 CA) MG tablet Take 1 tablet by mouth daily with breakfast.    . Cholecalciferol (VITAMIN D3) 5000 UNITS CAPS Take 1 capsule by mouth daily with supper.    Marland Kitchen LORazepam (ATIVAN) 1 MG tablet Take 1 tablet (1 mg total) by mouth every 8 (eight) hours as needed (panic attacks). 20 tablet 0  . naproxen sodium (ANAPROX) 220 MG tablet Take 220 mg by mouth 2 (two) times  daily as needed (pain).    Marland Kitchen oxyCODONE (OXY IR/ROXICODONE) 5 MG immediate release tablet Take 1-2 tablets (5-10 mg total) by mouth every 4 (four) hours as needed for severe pain. 30 tablet 0  . prochlorperazine (COMPAZINE) 10 MG tablet Take 1 tablet (10 mg total) by mouth every 6 (six) hours as needed for nausea or vomiting. 30 tablet 0   No current facility-administered medications for this visit.   Facility-Administered Medications Ordered in Other Visits  Medication Dose Route Frequency Provider Last Rate Last Dose  . fulvestrant (FASLODEX) injection 250 mg  250 mg Intramuscular Q14 Days Truitt Merle, MD        REVIEW OF SYSTEMS:   Constitutional: Denies fevers, chills or abnormal night sweats. (+) fatigue  Eyes: Denies blurriness of vision, double vision or watery eyes Ears, nose, mouth, throat, and face: Denies mucositis or sore  throat Respiratory:cough and dyspnea has significantly improved after radiation, no wheezes Cardiovascular: (+) mid chest pain, Denies palpitation, or lower extremity swelling Gastrointestinal:  Denies nausea, heartburn or change in bowel habits Skin: Denies abnormal skin rashes Lymphatics: Denies new lymphadenopathy or easy bruising Neurological:Denies numbness, tingling or new weaknesses Behavioral/Psych: Mood is stable, no new changes  All other systems were reviewed with the patient and are negative.  PHYSICAL EXAMINATION: ECOG PERFORMANCE STATUS: 1  Filed Vitals:   02/18/15 1301  BP: 106/68  Pulse: 86  Temp: 98.6 F (37 C)  Resp: 18   Filed Weights   02/18/15 1301  Weight: 105 lb 1.6 oz (47.673 kg)    GENERAL:alert, no distress and comfortable SKIN: skin color, texture, turgor are normal, no rashes or significant lesions EYES: normal, conjunctiva are pink and non-injected, sclera clear OROPHARYNX:no exudate, no erythema and lips, buccal mucosa, and tongue normal  NECK: supple, thyroid normal size, non-tender, without nodularity LYMPH:  no palpable lymphadenopathy in the cervical, axillary or inguinal LUNGS: clear to auscultation and percussion with normal breathing effort HEART: regular rate & rhythm and no murmurs and no lower extremity edema ABDOMEN:abdomen soft, non-tender and normal bowel sounds Musculoskeletal:no cyanosis of digits and no clubbing  PSYCH: alert & oriented x 3 with fluent speech NEURO: no focal motor/sensory deficits  LABORATORY DATA:  I have reviewed the data as listed CBC Latest Ref Rng 02/18/2015 02/11/2015 01/09/2015  WBC 3.9 - 10.3 10e3/uL 4.6 4.3 5.0  Hemoglobin 11.6 - 15.9 g/dL 13.6 13.7 13.5  Hematocrit 34.8 - 46.6 % 39.8 40.3 39.8  Platelets 145 - 400 10e3/uL 221 212 173    CMP Latest Ref Rng 02/18/2015 01/09/2015 01/02/2015  Glucose 70 - 140 mg/dl 80 84 77  BUN 7.0 - 26.0 mg/dL 16._0 Creatinine 0.6 - 1.1 mg/dL 0.9 0.83 0.80   Sodium 136 - 145 mEq/L 142 141 140  Potassium 3.5 - 5.1 mEq/L 4.6 4.3 4.0  Chloride 101 - 111 mmol/L - 105 104  CO2 22 - 29 mEq/L _1 Calcium 8.4 - 10.4 mg/dL 9.7 9.7 9.6  Total Protein 6.4 - 8.3 g/dL 6.8 - -  Total Bilirubin 0.20 - 1.20 mg/dL 0.33 - -  Alkaline Phos 40 - 150 U/L 86 - -  AST 5 - 34 U/L 21 - -  ALT 0 - 55 U/L 11 - -       PATHOLOGY REPORT: Diagnosis 05/05/2014 1. Pericardium, biopsy - BENIGN MESOTHELIAL LINED FIBROADIPOSE TISSUE. 2. Endobronchial biopsy, right middle lobe - POORLY DIFFERENTIATED CARCINOMA - SEE COMMENT. 3. Endobronchial biopsy, right lower lobe -  POORLY DIFFERENTIATED CARCINOMA. - SEE COMMENT. 1 of 2 FINAL for MCKINLEIGH, SCHUCHART ([id number]) Microscopic Comment 2. , 3. The malignant cells are positive for cytokeratin AE1/AE3 and estrogen receptor. They are negative for cytokeratin 7, cytokeratin 20, S-100, GCDFP, CD45, chromogranin, cytokeratin 5/6, synaptophysin, TTF-1. This immunohistochemical profile is not specific. However, given the patient's history, the positive staining for estrogen receptor is most suggestive of metastatic primary breast carcinoma. A HER-2 will be attempted and the results reportedly separately. (JBK:ecj 05/11/2014)  Results: HER-2/NEU BY CISH - NO AMPLIFICATION OF HER-2 DETECTED. RESULT RATIO OF HER2: CEP 17 SIGNALS 0.94 AVERAGE HER2 COPY NUMBER PER CELL 1.50  RADIOGRAPHIC STUDIES: I have personally reviewed the radiological images as listed and agreed with the findings in the report.  CT chest 05/01/2015  IMPRESSION: 1. Negative for pulmonary embolus. 2. Right hilar mass with compression of the SVC and right upper lobe pulmonary artery and obstruction of the right middle lobe bronchus. Right middle lobe mass and postobstructive collapse. Findings are highly worrisome for primary bronchogenic carcinoma. 3. Possible satellite right lower lobe nodule. 4. Moderate pericardial effusion. 5. Scattered small  nodular and ground-glass opacities in the right upper lobe, likely infectious or inflammatory in etiology.  CT chest w contrast 07/03/2014 IMPRESSION: Interval decrease in sizes of RIGHT upper lobe mass, RIGHT hilar adenopathy and mediastinal adenopathy.  RIGHT lower lobe pulmonary nodule decreased in size with a single RIGHT apex nodule minimally increased in size since previous study.   Electronically Signed   By: Lavonia Dana M.D.   On: 07/03/2014 10:48   CT abdomen and pelvis with contrast 12/09/2014 IMPRESSION: 1. Evolutionary changes of radiation therapy in the right perihilar region with decrease in size of a right middle lobe metastasis. 2. Scattered right upper lobe pulmonary nodules, some decreased in size, at least 1 is larger. 3. Small loculated right pleural effusion. 4. Focal anterior pericardial fluid, new. 5. No acute or metastatic findings in the abdomen or pelvis.  Bone scan 12/09/2014 IMPRESSION: 1. There is stable mildly increased uptake in the lateral aspects of the left fifth and right sixth ribs. This is stable. No other abnormal rib activity is observed. Review of the bone windows from a chest CT scan of today's date reveals no rib lesions. 2. There are no findings elsewhere in the skeleton suspicious for metastatic disease.  CT chest 01/02/2015 IMPRESSION: 1. No evidence of acute pulmonary embolus. 2. Chronic post treatment related architectural distortion in the right lung. Areas of abnormal right lung opacity appears stable since August except for increased nodularity along the right major fissure, see #3. 3. Increased but still small layering right pleural effusion. This constellation is suspicious for progressive pleural metastases.  Echo 01/19/2015 Study Conclusions  - Left ventricle: The cavity size was normal. Wall thickness was normal. Systolic function was normal. The estimated ejection fraction was in the range of 55% to 60%. Wall motion  was normal; there were no regional wall motion abnormalities. -Pericardium: There was no pericardial effusion.  ASSESSMENT & PLAN:  59 year old female with past medical history of stage IIB left breast cancer, status post mastectomy and axillary lymph node dissection in 2011. Now presented with metastatic disease to lungs and lymph nodes.  1. 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 She never smoked, and immunostain does not support primary lung cancer. -We  discussed the natural history of her cancer, unfortunately this is incurable at this stage, but treatable and we do have many treatment options.  -She has had good response to palliative radiation with significant symptom improvement. -PET scan was denied by her insurance. Bone scan showed a possible left lateral rib metastasis. CT of the abdomen was negative for other metastasis. -She has tried letrozole, anastrozole, and tamoxifen, but could not tolerate.  -We reviewed her recent CT chest again, she has slightly increased pleural effusion and pulmonary nodules, likely slow disease progression.  -The treatment options including chemotherapy or fulvestrant plus hyperdense was discussed with her again. She is willing to try fulvestrant injection now, potential side effects was reviewed with her again. She will start today, with low dose to 250 mg injection - I'll probably start her Ibrance with her second month of fulvestrant injection   2. Frequent panic attack -She is on the next 0.5 mg every 6-8 hours as needed, she takes about 3 tablets a day -she has Ativan 1 mg, one to 2 tablets every 8 hours as needed for panic attack.  3. 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 -She has a mild dyspnea on exertion, overall  her symptoms has significant improved after radiation. -Restaging CT scan in March 2016 showed significant improvement of her lung lesion, right hilar and mediastinal adenopathy. -Repeat bronchoscopy if needed.  4.  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 -May consider Xgeva monthly. Due to her unusual reaction to medications and recent dental issue, I have not started her Delton See yet.  5. Anxiety and depression, coping and social support -Her anxiety and depression got worse lately  -Continue as xanax as needed. She declined antidepressant medication  -Emotional support and encouragement  6. Right upper molar dental issue -I'll refer her to Dr. Enrique Sack.   7. Orthopnea -Possibly related to her endobronchial lesion in the pulmonary metastasis -I'll consult our social worker to see if her insurance will cover a hospital bed.  8. Insomnia -She will use Ativan 1 mg at bedtime as needed  Plan: -Return to clinic in 2 weeks with second dose of fulvestrant injection. If she tolerates today's injection well, we'll go up the dose to 500 mg next time  -Dental referral -Social worker referral for hospital bed  All questions were answered. The patient knows to call the clinic with any problems, questions or concerns. I spent 25 minutes counseling the patient face to face. The total time spent in the appointment was 30 minutes and more than 50% was on counseling.     Truitt Merle, MD 02/18/2015 1:58 PM

## 2015-02-18 NOTE — Telephone Encounter (Signed)
Gave adn printed appt sched and avs fo rpt for NOV °

## 2015-02-18 NOTE — Patient Instructions (Signed)

## 2015-02-24 ENCOUNTER — Other Ambulatory Visit: Payer: Self-pay | Admitting: *Deleted

## 2015-02-24 ENCOUNTER — Telehealth (HOSPITAL_COMMUNITY): Payer: Self-pay

## 2015-02-24 DIAGNOSIS — C50919 Malignant neoplasm of unspecified site of unspecified female breast: Secondary | ICD-10-CM

## 2015-02-24 DIAGNOSIS — C7800 Secondary malignant neoplasm of unspecified lung: Principal | ICD-10-CM

## 2015-02-24 NOTE — Telephone Encounter (Signed)
02/23/15            Called and left msg. for patient to call Dental Medicine to schedule Dental Consult w/Dr. Enrique Sack.  LRI

## 2015-03-03 ENCOUNTER — Other Ambulatory Visit (HOSPITAL_COMMUNITY): Payer: Medicaid Other | Admitting: Dentistry

## 2015-03-03 ENCOUNTER — Telehealth (HOSPITAL_COMMUNITY): Payer: Self-pay

## 2015-03-03 NOTE — Telephone Encounter (Signed)
03/03/15             Patient called on day of appt. for Dental Consult w/Dr. Enrique Sack @ 12:00 on 03/03/15 to cancel due to other medical  issues.  Patient stated she would call back at a later date to reschedule.  LRI

## 2015-03-04 ENCOUNTER — Other Ambulatory Visit: Payer: Self-pay | Admitting: Hematology

## 2015-03-05 ENCOUNTER — Encounter: Payer: Self-pay | Admitting: Hematology

## 2015-03-05 ENCOUNTER — Ambulatory Visit (HOSPITAL_BASED_OUTPATIENT_CLINIC_OR_DEPARTMENT_OTHER): Payer: Medicaid Other | Admitting: Hematology

## 2015-03-05 ENCOUNTER — Other Ambulatory Visit (HOSPITAL_BASED_OUTPATIENT_CLINIC_OR_DEPARTMENT_OTHER): Payer: Medicaid Other

## 2015-03-05 ENCOUNTER — Telehealth: Payer: Self-pay | Admitting: Hematology

## 2015-03-05 ENCOUNTER — Ambulatory Visit (HOSPITAL_BASED_OUTPATIENT_CLINIC_OR_DEPARTMENT_OTHER): Payer: Medicaid Other

## 2015-03-05 VITALS — BP 131/77 | HR 82 | Temp 97.9°F | Resp 18 | Ht 63.0 in | Wt 104.3 lb

## 2015-03-05 DIAGNOSIS — G4700 Insomnia, unspecified: Secondary | ICD-10-CM | POA: Diagnosis not present

## 2015-03-05 DIAGNOSIS — Z5111 Encounter for antineoplastic chemotherapy: Secondary | ICD-10-CM | POA: Diagnosis not present

## 2015-03-05 DIAGNOSIS — C50919 Malignant neoplasm of unspecified site of unspecified female breast: Secondary | ICD-10-CM

## 2015-03-05 DIAGNOSIS — C7800 Secondary malignant neoplasm of unspecified lung: Secondary | ICD-10-CM

## 2015-03-05 DIAGNOSIS — C7951 Secondary malignant neoplasm of bone: Secondary | ICD-10-CM | POA: Diagnosis not present

## 2015-03-05 DIAGNOSIS — F419 Anxiety disorder, unspecified: Secondary | ICD-10-CM

## 2015-03-05 DIAGNOSIS — R0601 Orthopnea: Secondary | ICD-10-CM

## 2015-03-05 DIAGNOSIS — C771 Secondary and unspecified malignant neoplasm of intrathoracic lymph nodes: Secondary | ICD-10-CM

## 2015-03-05 DIAGNOSIS — C50912 Malignant neoplasm of unspecified site of left female breast: Secondary | ICD-10-CM

## 2015-03-05 DIAGNOSIS — F410 Panic disorder [episodic paroxysmal anxiety] without agoraphobia: Secondary | ICD-10-CM

## 2015-03-05 DIAGNOSIS — R530 Neoplastic (malignant) related fatigue: Secondary | ICD-10-CM

## 2015-03-05 LAB — CBC WITH DIFFERENTIAL/PLATELET
BASO%: 0.5 % (ref 0.0–2.0)
Basophils Absolute: 0 10*3/uL (ref 0.0–0.1)
EOS%: 0.9 % (ref 0.0–7.0)
Eosinophils Absolute: 0 10*3/uL (ref 0.0–0.5)
HEMATOCRIT: 39.8 % (ref 34.8–46.6)
HEMOGLOBIN: 13.6 g/dL (ref 11.6–15.9)
LYMPH#: 0.6 10*3/uL — AB (ref 0.9–3.3)
LYMPH%: 12.9 % — ABNORMAL LOW (ref 14.0–49.7)
MCH: 28.8 pg (ref 25.1–34.0)
MCHC: 34.1 g/dL (ref 31.5–36.0)
MCV: 84.4 fL (ref 79.5–101.0)
MONO#: 0.5 10*3/uL (ref 0.1–0.9)
MONO%: 11.5 % (ref 0.0–14.0)
NEUT#: 3.4 10*3/uL (ref 1.5–6.5)
NEUT%: 74.2 % (ref 38.4–76.8)
Platelets: 232 10*3/uL (ref 145–400)
RBC: 4.72 10*6/uL (ref 3.70–5.45)
RDW: 13.5 % (ref 11.2–14.5)
WBC: 4.6 10*3/uL (ref 3.9–10.3)

## 2015-03-05 LAB — COMPREHENSIVE METABOLIC PANEL (CC13)
ALT: 14 U/L (ref 0–55)
AST: 23 U/L (ref 5–34)
Albumin: 4.3 g/dL (ref 3.5–5.0)
Alkaline Phosphatase: 85 U/L (ref 40–150)
Anion Gap: 9 mEq/L (ref 3–11)
BUN: 16.3 mg/dL (ref 7.0–26.0)
CHLORIDE: 106 meq/L (ref 98–109)
CO2: 28 meq/L (ref 22–29)
Calcium: 10.1 mg/dL (ref 8.4–10.4)
Creatinine: 1 mg/dL (ref 0.6–1.1)
EGFR: 65 mL/min/{1.73_m2} — AB (ref >90)
GLUCOSE: 92 mg/dL (ref 70–140)
POTASSIUM: 4.2 meq/L (ref 3.5–5.1)
SODIUM: 143 meq/L (ref 136–145)
TOTAL PROTEIN: 7.1 g/dL (ref 6.4–8.3)
Total Bilirubin: 0.35 mg/dL (ref 0.20–1.20)

## 2015-03-05 MED ORDER — ALPRAZOLAM 0.5 MG PO TABS: 0.5000 mg | ORAL_TABLET | Freq: Three times a day (TID) | ORAL | Status: DC | PRN

## 2015-03-05 MED ORDER — FULVESTRANT 250 MG/5ML IM SOLN
250.0000 mg | INTRAMUSCULAR | Status: DC
  Administered 2015-03-05: 250 mg via INTRAMUSCULAR
  Filled 2015-03-05: qty 5

## 2015-03-05 MED ORDER — LORAZEPAM 1 MG PO TABS: ORAL_TABLET | ORAL | Status: DC

## 2015-03-05 MED ORDER — OMEPRAZOLE 20 MG PO CPDR: 20.0000 mg | DELAYED_RELEASE_CAPSULE | Freq: Every day | ORAL | Status: DC

## 2015-03-05 NOTE — Telephone Encounter (Signed)
Gave patient avs report and appointments for November.  °

## 2015-03-05 NOTE — Progress Notes (Signed)
[place]  [phone] [phone]) [phone]  Clinic Follow Up Note   Patient Care Team: Pcp Not In System as PCP - [facility] 03/05/2015  CHIEF COMPLAINTS:  Recurrent breast cancer    Breast cancer metastasized to lung [facility])   12/23/2009 Surgery left mastectomy with axillary node dissection. Deep surgical margin were positive, pT2N1, tumor size 3.7 x 3.0 x 2.2 cm, grade 3, 2 out of 10 lymph nodes were positive for tumor cells. Patient declined adjuvant chemotherapy, radiation and endocrin therapy   04/30/2014 Progression She presented with worsening cough and dyspnea. CT on 05/01/2015 showed right hilar mass with compression of the SVC and right upper lobe pulmonary artery and obstruction of the right middle lobe bronchus. Moderate pericardial effusion   05/05/2014 Pathology Results Endobronchial biopsy of right lower lobe showed poorly differentiated adenocarcinoma, estrogen receptor positive, CK AE1/AE3 positive. Negative for CK 7, CK 20, TTF-1, S100,etc.  HER2(-). Pericardium biopsy negative.   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 CT abdomen and pelvis is negative for mets.    08/17/2014 -  Anti-estrogen oral therapy Letrozole 61m daily and palbociclib 6132mdaily 3 weeks on 1 week off, starting 08/18/2014, palbo was held for second cycle due to AEs (dizziness, fatigue and nose bleeding), Letrozole was held also due to AEs.    09/30/2014 -  Anti-estrogen oral therapy She subsequently tried anastrozole and tamoxifen, had a severe side effects, such as dizziness, fatigue, mood swings, after the first few doses, could not tolerate and stopped.   12/09/2014 Imaging CT chest abdomen and pelvis showed radiation  changes in the right perihilar region was decreased in size of a right middle lobe mass, scatter right upper lobe pulmonary nodules, stable overall.   12/09/2014 Imaging Bone scan showed stable mild increased uptake in the lateral left fifth and a right sixth ribs, stable overall.     HISTORY OF PRESENTING ILLNESS:  KaStevana Dufner59.o. female is here because of recently diagnosed metastatic recurrent breast cancer.  She was diagnosed with pT2 N1M0, stage IIB, left breast cancer in August 2011. [facility] onc[facility] after surgery, and declined adjuvant chemotherapy and endocrine therapy. She has not followed with physician regularly after that and does not have annual mammogram.  She started having dry cough and dyspnea on exertion in May 2016. She had multiple ED visit since then and was treated with multiple courses of antibiotics for bronchitis, which really did not help. In December 2015, she called the bowel pulmonary clinic and was evaluated for worsening cough and dyspnea. A CT of chest was obtained which showed a right hilar mass which cause compression on SVC and right upper lobe pulmonary artery and obstruction of right middle lobe. Moderate pericardial effusion was noticed. She underwent bronchoscopy and pericardial window on 05/01/2014. Biopsy of the right endobronchial lesion showed poorly differentiated adenoma, consistent with breast primary. Pericardial window biopsy and cytology was negative for malignancy.  She started palliative radiation on 06/10/2014, and completed on 06/29/2014, a total of 14 sessions. She tolerated well initially, but developed severe substernal chest pain and dysphagia at the last week of radiation.  She still has quite a bit chest pain, is taking Bumex and oxycodone, which helps only some. She is able to eat soft food diet and  adequate. She lost about 7 pounds during the radiation. Her dyspnea and cough has significantly improved after radiation.  CURRENT TREATMENT: Fulvestrant 500 mg monthly, started with first 250 mg im on 02/18/15 and 03/05/15   INTERIM HISTORY: She returns for follow up and second fulvestrant injection. She developed moderate to severe gastric discomfort and fatigue for 4 days after the first fulvestrant injection, improved gradually, and resolved a week ago. She has been feeling very well this week. She did not have mood swing, hot flash, or other side effects. She received hospital bed through the homecare service, and her orthopenia resolved.  MEDICAL HISTORY:  Past Medical History  Diagnosis Date  . Breast cancer ([place])   . Breast cancer ([place])   . Lung mass     SURGICAL HISTORY: Past Surgical History  Procedure Laterality Date  . Mastectomy Left     2010  . Eye surgery      cataract surgery, wears contact left eye  . Subxyphoid pericardial window N/A 05/05/2014    Procedure: SUBXYPHOID PERICARDIAL WINDOW;  Surgeon: Gaye Pollack, MD;  Location: [facility] OR;  Service: Thoracic;  Laterality: N/A;  . Flexible bronchoscopy N/A 05/05/2014    Procedure: FLEXIBLE BRONCHOSCOPY;  Surgeon: Gaye Pollack, MD;  Location: [place];  Service: Thoracic;  Laterality: N/A;  . Video bronchoscopy with endobronchial ultrasound N/A 05/05/2014    Procedure: VIDEO BRONCHOSCOPY WITH ENDOBRONCHIAL ULTRASOUND;  Surgeon: Gaye Pollack, MD;  Location: [place] OR;  Service: Thoracic;  Laterality: N/A;    SOCIAL HISTORY: Social History   Social History  . Marital Status: Single    Spouse Name: N/A  . Number of Children: N/A  . Years of Education: N/A   Occupational History  . Works PT reception    Social History Main Topics  . Smoking status: Never Smoker   . Smokeless tobacco: Never Used  . Alcohol Use: 0.0 oz/week    0 Standard drinks or equivalent per week     Comment: occ  . Drug Use: No  . Sexual Activity:  Not Currently   Other Topics Concern  . Not on file   Social History Narrative    FAMILY HISTORY: Family History  Problem Relation Age of Onset  . Cancer Paternal Aunt     leukemia  . Cancer Father 40    prostate cancer     ALLERGIES:  is allergic to iopamidol; contrast media; prednisone; and zyrtec.  MEDICATIONS:  Current Outpatient Prescriptions  Medication Sig Dispense Refill  . ALPRAZolam (XANAX) 0.5 MG tablet Take 1 tablet (0.5 mg total) by mouth 3 (three) times daily as needed for anxiety. 60 tablet 0  . calcium carbonate (OS-CAL - DOSED IN MG OF ELEMENTAL CALCIUM) 1250 (500 CA) MG tablet Take 1 tablet by mouth daily with breakfast.    . Cholecalciferol (VITAMIN D3) 5000 UNITS CAPS Take 1 capsule by mouth daily with supper.    Marland Kitchen LORazepam (ATIVAN) 1 MG tablet TAKE 1 TABLET EVERY 8 HOURS AS NEEDED PANIC ATTACKS 20 tablet 0  . naproxen sodium (ANAPROX) 220 MG tablet Take 220 mg by mouth 2 (two) times daily as needed (pain).    Marland Kitchen omeprazole (PRILOSEC) 20 MG capsule Take 1 capsule (20 mg total) by mouth daily. 30 capsule 1  . oxyCODONE (OXY IR/ROXICODONE) 5 MG immediate release tablet Take  1-2 tablets (5-10 mg total) by mouth every 4 (four) hours as needed for severe pain. (Patient not taking: Reported on 03/05/2015) 30 tablet 0  . prochlorperazine (COMPAZINE) 10 MG tablet Take 1 tablet (10 mg total) by mouth every 6 (six) hours as needed for nausea or vomiting. (Patient not taking: Reported on 03/05/2015) 30 tablet 0   No current facility-administered medications for this visit.    REVIEW OF SYSTEMS:   Constitutional: Denies fevers, chills or abnormal night sweats. (+) fatigue  Eyes: Denies blurriness of vision, double vision or watery eyes Ears, nose, mouth, throat, and face: Denies mucositis or sore throat Respiratory:cough and dyspnea has significantly improved after radiation, no wheezes Cardiovascular: (+) mid chest pain, Denies palpitation, or lower extremity  swelling Gastrointestinal:  Denies nausea, heartburn or change in bowel habits Skin: Denies abnormal skin rashes Lymphatics: Denies new lymphadenopathy or easy bruising Neurological:Denies numbness, tingling or new weaknesses Behavioral/Psych: Mood is stable, no new changes  All other systems were reviewed with the patient and are negative.  PHYSICAL EXAMINATION: ECOG PERFORMANCE STATUS: 1  Filed Vitals:   03/05/15 1426  BP: 131/77  Pulse: 82  Temp: 97.9 F (36.6 C)  Resp: 18   Filed Weights   03/05/15 1426  Weight: 104 lb 4.8 oz (47.31 kg)    GENERAL:alert, no distress and comfortable SKIN: skin color, texture, turgor are normal, no rashes or significant lesions EYES: normal, conjunctiva are pink and non-injected, sclera clear OROPHARYNX:no exudate, no erythema and lips, buccal mucosa, and tongue normal  NECK: supple, thyroid normal size, non-tender, without nodularity LYMPH:  no palpable lymphadenopathy in the cervical, axillary or inguinal LUNGS: clear to auscultation and percussion with normal breathing effort HEART: regular rate & rhythm and no murmurs and no lower extremity edema ABDOMEN:abdomen soft, non-tender and normal bowel sounds Musculoskeletal:no cyanosis of digits and no clubbing  PSYCH: alert & oriented x 3 with fluent speech NEURO: no focal motor/sensory deficits  LABORATORY DATA:  I have reviewed the data as listed CBC Latest Ref Rng 03/05/2015 02/18/2015 02/11/2015  WBC 3.9 - 10.3 10e3/uL 4.6 4.6 4.3  Hemoglobin 11.6 - 15.9 g/dL 13.6 13.6 13.7  Hematocrit 34.8 - 46.6 % 39.8 39.8 40.3  Platelets 145 - 400 10e3/uL 232 221 212    CMP Latest Ref Rng 03/05/2015 02/18/2015 01/09/2015  Glucose 70 - 140 mg/dl 92 80 84  BUN 7.0 - 26.0 mg/dL 16.3 16.3 20  Creatinine 0.6 - 1.1 mg/dL 1.0 0.9 0.83  Sodium 136 - 145 mEq/L 143 142 141  Potassium 3.5 - 5.1 mEq/L 4.2 4.6 4.3  Chloride 101 - 111 mmol/L - - 105  CO2 22 - 29 mEq/L 28 29 28   Calcium 8.4 - 10.4 mg/dL  10.1 9.7 9.7  Total Protein 6.4 - 8.3 g/dL 7.1 6.8 -  Total Bilirubin 0.20 - 1.20 mg/dL 0.35 0.33 -  Alkaline Phos 40 - 150 U/L 85 86 -  AST 5 - 34 U/L 23 21 -  ALT 0 - 55 U/L 14 11 -       PATHOLOGY REPORT: Diagnosis 05/05/2014 1. Pericardium, biopsy - BENIGN MESOTHELIAL LINED FIBROADIPOSE TISSUE. 2. Endobronchial biopsy, right middle lobe - POORLY DIFFERENTIATED CARCINOMA - SEE COMMENT. 3. Endobronchial biopsy, right lower lobe - POORLY DIFFERENTIATED CARCINOMA. - SEE COMMENT. 1 of 2 FINAL for SOLOMIYA, PASCALE ([id number]) Microscopic Comment 2. , 3. The malignant cells are positive for cytokeratin AE1/AE3 and estrogen receptor. They are negative for cytokeratin 7, cytokeratin 20, S-100, GCDFP, CD45, chromogranin,  cytokeratin 5/6, synaptophysin, TTF-1. This immunohistochemical profile is not specific. However, given the patient's history, the positive staining for estrogen receptor is most suggestive of metastatic primary breast carcinoma. A HER-2 will be attempted and the results reportedly separately. (JBK:ecj 05/11/2014)  Results: HER-2/NEU BY CISH - NO AMPLIFICATION OF HER-2 DETECTED. RESULT RATIO OF HER2: CEP 17 SIGNALS 0.94 AVERAGE HER2 COPY NUMBER PER CELL 1.50  RADIOGRAPHIC STUDIES: I have personally reviewed the radiological images as listed and agreed with the findings in the report.  CT chest 05/01/2015  IMPRESSION: 1. Negative for pulmonary embolus. 2. Right hilar mass with compression of the SVC and right upper lobe pulmonary artery and obstruction of the right middle lobe bronchus. Right middle lobe mass and postobstructive collapse. Findings are highly worrisome for primary bronchogenic carcinoma. 3. Possible satellite right lower lobe nodule. 4. Moderate pericardial effusion. 5. Scattered small nodular and ground-glass opacities in the right upper lobe, likely infectious or inflammatory in etiology.  CT chest w contrast 07/03/2014 IMPRESSION: Interval  decrease in sizes of RIGHT upper lobe mass, RIGHT hilar adenopathy and mediastinal adenopathy.  RIGHT lower lobe pulmonary nodule decreased in size with a single RIGHT apex nodule minimally increased in size since previous study.   Electronically Signed   By: Lavonia Dana M.D.   On: 07/03/2014 10:48   CT abdomen and pelvis with contrast 12/09/2014 IMPRESSION: 1. Evolutionary changes of radiation therapy in the right perihilar region with decrease in size of a right middle lobe metastasis. 2. Scattered right upper lobe pulmonary nodules, some decreased in size, at least 1 is larger. 3. Small loculated right pleural effusion. 4. Focal anterior pericardial fluid, new. 5. No acute or metastatic findings in the abdomen or pelvis.  Bone scan 12/09/2014 IMPRESSION: 1. There is stable mildly increased uptake in the lateral aspects of the left fifth and right sixth ribs. This is stable. No other abnormal rib activity is observed. Review of the bone windows from a chest CT scan of today's date reveals no rib lesions. 2. There are no findings elsewhere in the skeleton suspicious for metastatic disease.  CT chest 01/02/2015 IMPRESSION: 1. No evidence of acute pulmonary embolus. 2. Chronic post treatment related architectural distortion in the right lung. Areas of abnormal right lung opacity appears stable since August except for increased nodularity along the right major fissure, see #3. 3. Increased but still small layering right pleural effusion. This constellation is suspicious for progressive pleural metastases.  Echo 01/19/2015 Study Conclusions  - Left ventricle: The cavity size was normal. Wall thickness was normal. Systolic function was normal. The estimated ejection fraction was in the range of 55% to 60%. Wall motion was normal; there were no regional wall motion abnormalities. -Pericardium: There was no pericardial effusion.  ASSESSMENT & PLAN:  59 year old female with  past medical history of stage IIB left breast cancer, status post mastectomy and axillary lymph node dissection in 2011. Now presented with metastatic disease to lungs and lymph nodes.  1. 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 She never smoked, and immunostain does not support primary lung cancer. -We discussed the natural history of her cancer, unfortunately this is incurable at this stage, but treatable and we do have many treatment options.  -She has had good response to palliative radiation with significant symptom improvement. -PET scan was denied by her  insurance. Bone scan showed a possible left lateral rib metastasis. CT of the abdomen was negative for other metastasis. -She has tried letrozole, anastrozole, and tamoxifen, but could not tolerate.  -We reviewed her recent CT chest again, she has slightly increased pleural effusion and pulmonary nodules, likely slow disease progression.  -The treatment options including chemotherapy or fulvestrant plus Ibrance was discussed with her again. She is willing to try fulvestrant injection now, she tolerated the first low-dose moderately well, did have some side effects, she is reluctant to get full dose today. Will give 250 mg injection today. -She will return in 2 weeks to try the full dose of 500 mg fulvestrant injection. She will ask someone to come with her next time.  -She had severe gastric pain after the fulvestrant injection last time, I gave her a prescription of omeprazole 20 mg once daily for 5 days after injection. -lab result reviewed with her   2. Frequent panic attack -She is on the next 0.5 mg every 6-8 hours as needed, she takes about 3 tablets a day -she has Ativan 1 mg, one to 2 tablets every 8 hours as needed for panic attack. -I  refilled her meds   3. 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 -She has a mild dyspnea on exertion, overall her symptoms has significant improved after radiation. -Restaging CT scan in March 2016 showed significant improvement of her lung lesion, right hilar and mediastinal adenopathy. -Repeat bronchoscopy if needed.  4.  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 -May consider Xgeva monthly. Due to her unusual reaction to medications and recent dental issue, I have not started her Delton See yet.  5. Anxiety and depression, coping and social support -Her anxiety and depression is stable  -Continue as xanax as needed. She declined antidepressant medication  -Emotional support and encouragement  6. Right upper molar dental issue -I'll refer her to Dr. Enrique Sack.   7. Orthopnea -Possibly related to her endobronchial lesion in the pulmonary metastasis -I'll consult our social worker to see if her insurance will cover a hospital bed.  8. Insomnia -She will use Ativan 1 mg at bedtime as needed  Plan: -Return to clinic in 2 weeks with third dose of fulvestrant injection. If she tolerates today's injection well, we'll go up to full dose to 500 mg next time    All questions were answered. The patient knows to call the clinic with any problems, questions or concerns. I spent 25 minutes counseling the patient face to face. The total time spent in the appointment was 30 minutes and more than 50% was on counseling.     Truitt Merle, MD 03/05/2015 3:06 PM

## 2015-03-08 ENCOUNTER — Telehealth (HOSPITAL_COMMUNITY): Payer: Self-pay

## 2015-03-08 NOTE — Telephone Encounter (Signed)
03/08/15    Called and left another msg. for patient to call Dental Medicine and schedule Dental Consult w/Dr. Enrique Sack.  LRI

## 2015-03-18 ENCOUNTER — Other Ambulatory Visit: Payer: Medicaid Other

## 2015-03-18 ENCOUNTER — Telehealth: Payer: Self-pay | Admitting: Hematology

## 2015-03-18 ENCOUNTER — Ambulatory Visit: Payer: Medicaid Other

## 2015-03-18 ENCOUNTER — Ambulatory Visit (HOSPITAL_BASED_OUTPATIENT_CLINIC_OR_DEPARTMENT_OTHER): Payer: Medicaid Other | Admitting: Hematology

## 2015-03-18 ENCOUNTER — Encounter: Payer: Self-pay | Admitting: Hematology

## 2015-03-18 VITALS — BP 116/71 | HR 88 | Temp 97.6°F | Resp 18 | Ht 63.0 in | Wt 105.7 lb

## 2015-03-18 DIAGNOSIS — C7800 Secondary malignant neoplasm of unspecified lung: Secondary | ICD-10-CM

## 2015-03-18 DIAGNOSIS — C50919 Malignant neoplasm of unspecified site of unspecified female breast: Secondary | ICD-10-CM

## 2015-03-18 MED ORDER — LORAZEPAM 1 MG PO TABS: ORAL_TABLET | ORAL | Status: DC

## 2015-03-18 NOTE — Progress Notes (Signed)
[name]  [phone] Fax:(336) [phone]  Clinic Follow Up Note   Patient Care Team: Pcp Not In System as PCP - [facility] 03/18/2015  CHIEF COMPLAINTS:  Recurrent breast cancer    Breast cancer metastasized to lung [facility])   12/23/2009 Surgery left mastectomy with axillary node dissection. Deep surgical margin were positive, pT2N1, tumor size 3.7 x 3.0 x 2.2 cm, grade 3, 2 out of 10 lymph nodes were positive for tumor cells. Patient declined adjuvant chemotherapy, radiation and endocrin therapy   04/30/2014 Progression She presented with worsening cough and dyspnea. CT on 05/01/2015 showed right hilar mass with compression of the SVC and right upper lobe pulmonary artery and obstruction of the right middle lobe bronchus. Moderate pericardial effusion   05/05/2014 Pathology Results Endobronchial biopsy of right lower lobe showed poorly differentiated adenocarcinoma, estrogen receptor positive, CK AE1/AE3 positive. Negative for CK 7, CK 20, TTF-1, S100,etc.  HER2(-). Pericardium biopsy negative.   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 CT abdomen and pelvis is negative for mets.    08/17/2014 -  Anti-estrogen oral therapy Letrozole 5m daily and palbociclib 1267mdaily 3 weeks on 1 week off, starting 08/18/2014, palbo was held for second cycle due to AEs (dizziness, fatigue and nose bleeding), Letrozole was held also due to AEs.    09/30/2014 -  Anti-estrogen oral therapy She subsequently tried anastrozole and tamoxifen, had a severe side effects, such as dizziness, fatigue, mood swings, after the first few doses, could not tolerate and stopped.   12/09/2014 Imaging CT chest abdomen and pelvis showed radiation  changes in the right perihilar region was decreased in size of a right middle lobe mass, scatter right upper lobe pulmonary nodules, stable overall.   12/09/2014 Imaging Bone scan showed stable mild increased uptake in the lateral left fifth and a right sixth ribs, stable overall.     HISTORY OF PRESENTING ILLNESS:  Patricia Verrill59o. female is here because of recently diagnosed metastatic recurrent breast cancer.  She was diagnosed with pT2 N1M0, stage IIB, left breast cancer in August 2011. [facility], and declined adjuvant chemotherapy and endocrine therapy. She has not followed with physician regularly after that and does not have annual mammogram.  She started having dry cough and dyspnea on exertion in May 2016. She had multiple ED visit since then and was treated with multiple courses of antibiotics for bronchitis, which really did not help. In December 2015, she called the bowel pulmonary clinic and was evaluated for worsening cough and dyspnea. A CT of chest was obtained which showed a right hilar mass which cause compression on SVC and right upper lobe pulmonary artery and obstruction of right middle lobe. Moderate pericardial effusion was noticed. She underwent bronchoscopy and pericardial window on 05/01/2014. Biopsy of the right endobronchial lesion showed poorly differentiated adenoma, consistent with breast primary. Pericardial window biopsy and cytology was negative for malignancy.  She started palliative radiation on 06/10/2014, and completed on 06/29/2014, a total of 14 sessions. She tolerated well initially, but developed severe substernal chest pain and dysphagia at the last week of radiation.  She still has quite a bit chest pain, is taking Bumex and oxycodone, which helps only some. She is able to eat soft food diet and  adequate. She lost about 7 pounds during the radiation. Her dyspnea and cough has significantly improved after radiation.  CURRENT TREATMENT: Fulvestrant 500 mg monthly, started with first 250 mg im on 02/18/15 and 03/05/15   INTERIM HISTORY: She returns for follow up and third fulvestrant injection. She is accompanied by her ex-husband to the clinic today. She had much more side effects from the second injection, with gastric pain, anorexia dizziness and insomnia. She has been much more depressed lately, feels hopeless, and she broke into tears many times during her office visit today. Xanax does not help much, and she has been using ativan more ofen than before.   MEDICAL HISTORY:  Past Medical History  Diagnosis Date  . Breast cancer ([place])   . Breast cancer ([place])   . Lung mass     SURGICAL HISTORY: Past Surgical History  Procedure Laterality Date  . Mastectomy Left     2010  . Eye surgery      cataract surgery, wears contact left eye  . Subxyphoid pericardial window N/A 05/05/2014    Procedure: SUBXYPHOID PERICARDIAL WINDOW;  Surgeon: Gaye Pollack, MD;  Location: [facility] OR;  Service: Thoracic;  Laterality: N/A;  . Flexible bronchoscopy N/A 05/05/2014    Procedure: FLEXIBLE BRONCHOSCOPY;  Surgeon: Gaye Pollack, MD;  Location: [place];  Service: Thoracic;  Laterality: N/A;  . Video bronchoscopy with endobronchial ultrasound N/A 05/05/2014    Procedure: VIDEO BRONCHOSCOPY WITH ENDOBRONCHIAL ULTRASOUND;  Surgeon: Gaye Pollack, MD;  Location: [facility] OR;  Service: Thoracic;  Laterality: N/A;    SOCIAL HISTORY: Social History   Social History  . Marital Status:  divorced     Spouse Name: N/A  . Number of Children:  she has one daughter who lives in [place]   . Years of Education: N/A   Occupational History  . Works PT reception    Social History Main Topics  . Smoking status: Never Smoker   . Smokeless tobacco: Never Used  . Alcohol Use: 0.0 oz/week    0 Standard drinks or equivalent  per week     Comment: occ  . Drug Use: No  . Sexual Activity: Not Currently   Other Topics Concern  . Not on file   Social History Narrative    FAMILY HISTORY: Family History  Problem Relation Age of Onset  . Cancer Paternal Aunt     leukemia  . Cancer Father 52    prostate cancer     ALLERGIES:  is allergic to iopamidol; contrast media; prednisone; and zyrtec.  MEDICATIONS:  Current Outpatient Prescriptions  Medication Sig Dispense Refill  . ALPRAZolam (XANAX) 0.5 MG tablet Take 1 tablet (0.5 mg total) by mouth 3 (three) times daily as needed for anxiety. 60 tablet 0  . Cholecalciferol (VITAMIN D3) 5000 UNITS CAPS Take 1 capsule by mouth daily with supper.    Marland Kitchen LORazepam (ATIVAN) 1 MG tablet TAKE 1 TABLET EVERY 8 HOURS AS NEEDED PANIC ATTACKS 30 tablet 0  . naproxen sodium (ANAPROX) 220 MG tablet Take 220 mg by mouth 2 (two) times daily as needed (pain).    . calcium carbonate (OS-CAL - DOSED IN MG OF ELEMENTAL CALCIUM) 1250 (500 CA) MG tablet Take 1 tablet by mouth daily with breakfast.    . omeprazole (PRILOSEC) 20 MG capsule Take 1 capsule (20  mg total) by mouth daily. (Patient not taking: Reported on 03/18/2015) 30 capsule 1  . oxyCODONE (OXY IR/ROXICODONE) 5 MG immediate release tablet Take 1-2 tablets (5-10 mg total) by mouth every 4 (four) hours as needed for severe pain. (Patient not taking: Reported on 03/05/2015) 30 tablet 0  . prochlorperazine (COMPAZINE) 10 MG tablet Take 1 tablet (10 mg total) by mouth every 6 (six) hours as needed for nausea or vomiting. (Patient not taking: Reported on 03/05/2015) 30 tablet 0   No current facility-administered medications for this visit.    REVIEW OF SYSTEMS:   Constitutional: Denies fevers, chills or abnormal night sweats. (+) fatigue  Eyes: Denies blurriness of vision, double vision or watery eyes Ears, nose, mouth, throat, and face: Denies mucositis or sore throat Respiratory:cough and dyspnea has significantly improved after  radiation, no wheezes Cardiovascular: (+) mid chest pain, Denies palpitation, or lower extremity swelling Gastrointestinal:  Denies nausea, heartburn or change in bowel habits Skin: Denies abnormal skin rashes Lymphatics: Denies new lymphadenopathy or easy bruising Neurological:Denies numbness, tingling or new weaknesses Behavioral/Psych: Mood is stable, no new changes  All other systems were reviewed with the patient and are negative.  PHYSICAL EXAMINATION: ECOG PERFORMANCE STATUS: 2  Filed Vitals:   03/18/15 1111  BP: 116/71  Pulse: 88  Temp: 97.6 F (36.4 C)  Resp: 18   Filed Weights   03/18/15 1111  Weight: 105 lb 11.2 oz (47.945 kg)    GENERAL:alert, quite distressed and in tears  SKIN: skin color, texture, turgor are normal, no rashes or significant lesions EYES: normal, conjunctiva are pink and non-injected, sclera clear OROPHARYNX:no exudate, no erythema and lips, buccal mucosa, and tongue normal  NECK: supple, thyroid normal size, non-tender, without nodularity LYMPH:  no palpable lymphadenopathy in the cervical, axillary or inguinal LUNGS: clear to auscultation and percussion with normal breathing effort HEART: regular rate & rhythm and no murmurs and no lower extremity edema ABDOMEN:abdomen soft, non-tender and normal bowel sounds Musculoskeletal:no cyanosis of digits and no clubbing  PSYCH: alert & oriented x 3 with fluent speech NEURO: no focal motor/sensory deficits  LABORATORY DATA:  I have reviewed the data as listed CBC Latest Ref Rng 03/05/2015 02/18/2015 02/11/2015  WBC 3.9 - 10.3 10e3/uL 4.6 4.6 4.3  Hemoglobin 11.6 - 15.9 g/dL 13.6 13.6 13.7  Hematocrit 34.8 - 46.6 % 39.8 39.8 40.3  Platelets 145 - 400 10e3/uL 232 221 212    CMP Latest Ref Rng 03/05/2015 02/18/2015 01/09/2015  Glucose 70 - 140 mg/dl 92 80 84  BUN 7.0 - 26.0 mg/dL 16.3 16.3 20  Creatinine 0.6 - 1.1 mg/dL 1.0 0.9 0.83  Sodium 136 - 145 mEq/L 143 142 141  Potassium 3.5 - 5.1 mEq/L  4.2 4.6 4.3  Chloride 101 - 111 mmol/L - - 105  CO2 22 - 29 mEq/L 28 29 28   Calcium 8.4 - 10.4 mg/dL 10.1 9.7 9.7  Total Protein 6.4 - 8.3 g/dL 7.1 6.8 -  Total Bilirubin 0.20 - 1.20 mg/dL 0.35 0.33 -  Alkaline Phos 40 - 150 U/L 85 86 -  AST 5 - 34 U/L 23 21 -  ALT 0 - 55 U/L 14 11 -       PATHOLOGY REPORT: Diagnosis 05/05/2014 1. Pericardium, biopsy - BENIGN MESOTHELIAL LINED FIBROADIPOSE TISSUE. 2. Endobronchial biopsy, right middle lobe - POORLY DIFFERENTIATED CARCINOMA - SEE COMMENT. 3. Endobronchial biopsy, right lower lobe - POORLY DIFFERENTIATED CARCINOMA. - SEE COMMENT. 1 of 2 FINAL for GIANI, WINTHER ([id number]) Microscopic  Comment 2. , 3. The malignant cells are positive for cytokeratin AE1/AE3 and estrogen receptor. They are negative for cytokeratin 7, cytokeratin 20, S-100, GCDFP, CD45, chromogranin, cytokeratin 5/6, synaptophysin, TTF-1. This immunohistochemical profile is not specific. However, given the patient's history, the positive staining for estrogen receptor is most suggestive of metastatic primary breast carcinoma. A HER-2 will be attempted and the results reportedly separately. (JBK:ecj 05/11/2014)  Results: HER-2/NEU BY CISH - NO AMPLIFICATION OF HER-2 DETECTED. RESULT RATIO OF HER2: CEP 17 SIGNALS 0.94 AVERAGE HER2 COPY NUMBER PER CELL 1.50  RADIOGRAPHIC STUDIES: I have personally reviewed the radiological images as listed and agreed with the findings in the report.  CT chest 05/01/2015  IMPRESSION: 1. Negative for pulmonary embolus. 2. Right hilar mass with compression of the SVC and right upper lobe pulmonary artery and obstruction of the right middle lobe bronchus. Right middle lobe mass and postobstructive collapse. Findings are highly worrisome for primary bronchogenic carcinoma. 3. Possible satellite right lower lobe nodule. 4. Moderate pericardial effusion. 5. Scattered small nodular and ground-glass opacities in the right upper lobe,  likely infectious or inflammatory in etiology.  CT chest w contrast 07/03/2014 IMPRESSION: Interval decrease in sizes of RIGHT upper lobe mass, RIGHT hilar adenopathy and mediastinal adenopathy.  RIGHT lower lobe pulmonary nodule decreased in size with a single RIGHT apex nodule minimally increased in size since previous study.   Electronically Signed   By: Lavonia Dana M.D.   On: 07/03/2014 10:48   CT abdomen and pelvis with contrast 12/09/2014 IMPRESSION: 1. Evolutionary changes of radiation therapy in the right perihilar region with decrease in size of a right middle lobe metastasis. 2. Scattered right upper lobe pulmonary nodules, some decreased in size, at least 1 is larger. 3. Small loculated right pleural effusion. 4. Focal anterior pericardial fluid, new. 5. No acute or metastatic findings in the abdomen or pelvis.  Bone scan 12/09/2014 IMPRESSION: 1. There is stable mildly increased uptake in the lateral aspects of the left fifth and right sixth ribs. This is stable. No other abnormal rib activity is observed. Review of the bone windows from a chest CT scan of today's date reveals no rib lesions. 2. There are no findings elsewhere in the skeleton suspicious for metastatic disease.  CT chest 01/02/2015 IMPRESSION: 1. No evidence of acute pulmonary embolus. 2. Chronic post treatment related architectural distortion in the right lung. Areas of abnormal right lung opacity appears stable since August except for increased nodularity along the right major fissure, see #3. 3. Increased but still small layering right pleural effusion. This constellation is suspicious for progressive pleural metastases.  Echo 01/19/2015 Study Conclusions  - Left ventricle: The cavity size was normal. Wall thickness was normal. Systolic function was normal. The estimated ejection fraction was in the range of 55% to 60%. Wall motion was normal; there were no regional wall motion  abnormalities. -Pericardium: There was no pericardial effusion.  ASSESSMENT & PLAN:  59 year old female with past medical history of stage IIB left breast cancer, status post mastectomy and axillary lymph node dissection in 2011. Now presented with metastatic disease to lungs and lymph nodes.  1. 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 She never smoked, and immunostain does not support primary lung cancer. -We discussed the natural history of her cancer, unfortunately this is incurable at this stage, but  treatable and we do have many treatment options.  -She has had good response to palliative radiation with significant symptom improvement. -PET scan was denied by her insurance. Bone scan showed a possible left lateral rib metastasis. CT of the abdomen was negative for other metastasis. -She has tried letrozole, anastrozole, and tamoxifen, but could not tolerate.  -We reviewed her recent CT chest again, she has slightly increased pleural effusion and pulmonary nodules, likely slow disease progression.  -She has received 2 for estrogen injection every 2 weeks, at a low dose 250 mg, however she again experienced severe side effects, especially after second injection. Her anxiety and depression got much worse due to the side effects. -I recommend her to stop fulvestrant injection, I'll give her time to recover. I do not think she can tolerate or handle the side effects from chemotherapy, we'll hold on her treatment for now.  2. Depression and anxiety -Much worse lately due to the side effects from fulvestrant -She denies suicidal ideas. -I recommend her to see a psychiatrist. She wants to talk to her pastor and friends in the church first. -Emotional support and encouragement -She declined  antidepressant medication   3. Frequent panic attack -She is on Xanax 0.5 mg every 6-8 hours as needed, she takes about 3 tablets a day -she has Ativan 1 mg, one to 2 tablets every 8 hours as needed for panic attack. -I refilled her meds   4. 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 -She has a mild dyspnea on exertion, overall her symptoms has significant improved after radiation. -Restaging CT scan in March 2016 showed significant improvement of her lung lesion, right hilar and mediastinal adenopathy. -Repeat bronchoscopy if needed.  5.  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 -May consider Xgeva monthly. Due to her unusual reaction to medications and recent dental issue, I have not started her Delton See yet.  6. Right upper molar dental issue -I have refered her to Dr. Enrique Sack.   7. Orthopnea -Possibly related to her endobronchial lesion in the pulmonary metastasis -She has received a hospital bed through home care service   8. Insomnia -She will use Ativan 1 mg at bedtime as needed  Plan: -Return to clinic in 5-6  Weeks for folllow up and lab  -psych referral if she agrees    All questions were answered. The patient knows to call the clinic with any problems, questions or concerns. I spent 25 minutes counseling the patient face to face. The total time spent in the appointment was 30 minutes and more than 50% was on counseling.     Truitt Merle, MD 03/18/2015 10:15 PM

## 2015-03-18 NOTE — Telephone Encounter (Signed)
per pof to sch pt appt-gave pt copy of avs °

## 2015-03-18 NOTE — Telephone Encounter (Signed)
per pDr Burr Medico to CX lab appt-cx

## 2015-03-19 ENCOUNTER — Telehealth: Payer: Self-pay | Admitting: *Deleted

## 2015-03-19 NOTE — Telephone Encounter (Signed)
Pt called and left message re:  Pt has agreed to see a psychologist to help her deal with the cancer illness.  Pt would like to have a referral from Dr. Burr Medico to see a psychiatrist. Pt's  Phone    [phone].

## 2015-03-23 ENCOUNTER — Telehealth: Payer: Self-pay | Admitting: *Deleted

## 2015-03-23 ENCOUNTER — Telehealth (HOSPITAL_COMMUNITY): Payer: Self-pay

## 2015-03-23 ENCOUNTER — Other Ambulatory Visit: Payer: Self-pay | Admitting: *Deleted

## 2015-03-23 DIAGNOSIS — C50919 Malignant neoplasm of unspecified site of unspecified female breast: Secondary | ICD-10-CM

## 2015-03-23 DIAGNOSIS — C7800 Secondary malignant neoplasm of unspecified lung: Principal | ICD-10-CM

## 2015-03-23 NOTE — Telephone Encounter (Signed)
Received call from pt stating that she would like speak with Dr Burr Medico because she has some questions.  She states her next appt is 12/16.  She is upset & crying.  I asked if I could relay the questions & she agreed.  She has a lot of concerns & wants to know if she is going to be on meds all her life.  She talked about extreme trouble breathing & none of the meds she has taken has seemed to help her breathing.  She states "I don't understand what is happening to me, Am I ever going to be able to breath". "Xanax helps for @ 1 hour but the rest of time my chest feels tight.  I don't sleep at night.  I have trouble with dizziness & sometimes feel like throwing up.  I just need to understand.  I am afraid"  We discussed referral to psychiatrist & we also discussed moving closer to her family.  She states that she wants to move closer to daughter & they are working on this & has actually seen some plans for a house to be built so that she can move in with daughter.   Message relayed to Dr Burr Medico.

## 2015-03-23 NOTE — Telephone Encounter (Signed)
Spoke with [facility] and made referral for pt to see a psychiatrist.   Referrral made in EPIC.  Pt will be contacted by [facility] with appt. Behavioral Health   Phone    [phone].

## 2015-03-24 NOTE — Telephone Encounter (Signed)
TC from patient asking to speak to Dr. Burr Medico. Advised patient that Dr. Burr Medico is out of the office until Monday, 03/29/15.  Pt wanted to know what the plan was for her anxiety issues. Advised her that a referral had been made to [place]. It appears in EPIC that someone tried to call her from [facility] yesterday but was unable to leave a VM.   # to [facility] given to patient.  Also, patient states that she was able to set up her VM last night.

## 2015-03-29 ENCOUNTER — Telehealth: Payer: Self-pay | Admitting: *Deleted

## 2015-03-29 NOTE — Telephone Encounter (Signed)
Patricia Carter [phone]  Estha called to see if she could get a new pt appt. A referral was sent to [facility], but she lives closer to [place], so she would like to come here.

## 2015-03-29 NOTE — Telephone Encounter (Signed)
Pt is schedule for an appt w/ [facility] on 04/12/15. Called and confirmed appt with pt.

## 2015-03-29 NOTE — Telephone Encounter (Signed)
Pt called and left message requesting to talk to Dr. Burr Medico about her treatment plan.  Pt stated she did not know what to do about her treatment, how pt will react to treatment.  Stated she could come in to talk with Dr. Burr Medico if needed.  Pt also would like to know if there is any housing assistance for pt going through treatments since it will be a while before pt could be relocated near her family. Message sent to Abby Potash, Education officer, museum.  Spoke with [facility] @ [place].  Pt has phone number to the clinic in [place] - closer to pt - to call for appt.  Sunday Spillers stated once pt has appt with Behavioral Health in [place], collaborative nurse will be notified of pt's appt. Pt's   Phone    [phone].

## 2015-03-29 NOTE — Telephone Encounter (Signed)
I called pt back and encouraged her to make her appointment with [facility] asap, she did try around noon today and left a message. I will discuss her treatment options again when I see her back on 12/16. I do not think she can tolerate any therapy if her anxiety is not controlled.  Abby Potash, pt wants to know if her medicaid will cover her stay in a independent living facility or any house assistance she can get. Could you give her a call?  Thanks.  Truitt Merle 03/29/2015

## 2015-03-30 ENCOUNTER — Encounter: Payer: Self-pay | Admitting: *Deleted

## 2015-03-30 NOTE — Progress Notes (Signed)
[place] Work  Clinical Social Work was referred by Futures trader for assessment of psychosocial needs.  Clinical Social Worker contacted patient at home to offer support and assess for needs.  Patient expressed concern for living alone.  Patient stated she has "panic attacks" which make it difficult for her to breathe, and this creates a fear of living/being alone.  CSw and patient discussed emotional support and appropriate interventions.  Patient stated she had an appointment at [facility] on December 10th.  Patient also expressed interest in Assisted living due to her fear of living alone.  CSW and patient discussed ALF and admission criteria.  CSW also discussed home health assessments and PCS services through Medicaid.  Patient was agreeable to Home health referral and PCS referral.  CSW shared information with RN and request for Home Health assessment.  CSW will facilitate PCS referral.  CSW encourage patient to call with additional needs or concerns.    Johnnye Lana, MSW, LCSW, OSW-C Clinical Social Worker [facility] [phone]

## 2015-04-01 ENCOUNTER — Other Ambulatory Visit: Payer: Self-pay | Admitting: Hematology

## 2015-04-02 ENCOUNTER — Encounter: Payer: Self-pay | Admitting: *Deleted

## 2015-04-02 ENCOUNTER — Other Ambulatory Visit: Payer: Self-pay | Admitting: *Deleted

## 2015-04-02 DIAGNOSIS — C7800 Secondary malignant neoplasm of unspecified lung: Principal | ICD-10-CM

## 2015-04-02 DIAGNOSIS — C50919 Malignant neoplasm of unspecified site of unspecified female breast: Secondary | ICD-10-CM

## 2015-04-02 NOTE — Progress Notes (Signed)
[place] Work  Clinical Social Work was referred by nurse for assistance with support/additional care in the home for pt. CSW provided medical team with PCS form for completion on 03/30/15. CSW team made several attempts to see if form was ready for submission. Per RN, form not completed to date. CSW educated medical team that referral can take weeks to get completed once submitted. RN states they will inform CSW team when form ready to submit.  Clinical Social Work interventions: Set designer coordination Loren Racer, [place] Worker [place]  [place] Phone: [phone] Fax: [phone]

## 2015-04-05 ENCOUNTER — Telehealth: Payer: Self-pay | Admitting: Internal Medicine

## 2015-04-05 ENCOUNTER — Other Ambulatory Visit: Payer: Self-pay | Admitting: *Deleted

## 2015-04-05 DIAGNOSIS — C50919 Malignant neoplasm of unspecified site of unspecified female breast: Secondary | ICD-10-CM

## 2015-04-05 MED ORDER — ALPRAZOLAM 0.5 MG PO TABS: 0.5000 mg | ORAL_TABLET | Freq: Three times a day (TID) | ORAL | Status: DC | PRN

## 2015-04-05 NOTE — Telephone Encounter (Signed)
Called spoke with pt. Aware of recs below. She verbalized understanding and needed nothing further

## 2015-04-05 NOTE — Telephone Encounter (Signed)
These are all stable changes related to the breast cancer we know she has had  and she should address these at her next visit with her treating physician (Dr Burr Medico, who was aware of these changes and documented them in his last note) .  Nothing to discuss over the phone from pulmonary perspective but I would be happy to see her prn new resp symptoms or concerns

## 2015-04-05 NOTE — Telephone Encounter (Signed)
Spoke with pt, states she's being seen by Dr. Burr Medico (oncology) but wants to speak to MW about cxr taken in ED on 02/11/15-pt concerned about the findings listed on cxr-"stable extensive scarring changes in R peripheral region", "persistent pleural effusion and overlying atelectasis".  Pt states she googled these terms and is very concerned that this has not yet been discussed with her.  Pt requesting a call from MW or our office with further clarification of this cxr results.  MW please advise.  Thanks!

## 2015-04-09 ENCOUNTER — Telehealth: Payer: Self-pay | Admitting: *Deleted

## 2015-04-09 ENCOUNTER — Encounter: Payer: Self-pay | Admitting: *Deleted

## 2015-04-09 NOTE — Telephone Encounter (Signed)
Received message from Ermila Braner @ [facility] wanting to inform Dr. Burr Medico re:  Services requested for home health aide were declined by [facility] due to nature of pt's needs.  Rosaura stated maybe once pt sees psychiatrist,  There might be home health aide from the psychiatry program that pt might be qualified. [facility]   Phone    [phone].

## 2015-04-09 NOTE — Progress Notes (Signed)
[place] Work  Clinical Social Work received [employer] referral form from medical team and faxed to [employer] for processing. CSW phoned pt and left message stating this referral was completed today and that Janeece Riggers would be contacting her to set up appointment with her to assess needs. CSW left CSW contact information with pt in case she had any questions as well.   Clinical Social Work interventions: Resource coordination  Loren Racer, [place] Worker [place]  [place] Phone: [phone] Fax: [phone]

## 2015-04-12 ENCOUNTER — Ambulatory Visit (INDEPENDENT_AMBULATORY_CARE_PROVIDER_SITE_OTHER): Payer: Medicaid Other | Admitting: Licensed Clinical Social Worker

## 2015-04-12 DIAGNOSIS — F4323 Adjustment disorder with mixed anxiety and depressed mood: Secondary | ICD-10-CM

## 2015-04-13 ENCOUNTER — Telehealth: Payer: Self-pay

## 2015-04-13 ENCOUNTER — Telehealth: Payer: Self-pay | Admitting: Hematology

## 2015-04-13 ENCOUNTER — Encounter (HOSPITAL_COMMUNITY): Payer: Self-pay | Admitting: Licensed Clinical Social Worker

## 2015-04-13 ENCOUNTER — Ambulatory Visit (HOSPITAL_BASED_OUTPATIENT_CLINIC_OR_DEPARTMENT_OTHER): Payer: Medicaid Other | Admitting: Hematology

## 2015-04-13 ENCOUNTER — Encounter: Payer: Self-pay | Admitting: Hematology

## 2015-04-13 VITALS — BP 92/59 | HR 87 | Temp 97.7°F | Resp 18 | Ht 63.0 in | Wt 107.3 lb

## 2015-04-13 DIAGNOSIS — F410 Panic disorder [episodic paroxysmal anxiety] without agoraphobia: Secondary | ICD-10-CM | POA: Diagnosis not present

## 2015-04-13 DIAGNOSIS — C50919 Malignant neoplasm of unspecified site of unspecified female breast: Secondary | ICD-10-CM | POA: Diagnosis present

## 2015-04-13 DIAGNOSIS — C7951 Secondary malignant neoplasm of bone: Secondary | ICD-10-CM

## 2015-04-13 DIAGNOSIS — R0600 Dyspnea, unspecified: Secondary | ICD-10-CM | POA: Diagnosis not present

## 2015-04-13 DIAGNOSIS — F419 Anxiety disorder, unspecified: Secondary | ICD-10-CM

## 2015-04-13 DIAGNOSIS — R0789 Other chest pain: Secondary | ICD-10-CM

## 2015-04-13 DIAGNOSIS — F4323 Adjustment disorder with mixed anxiety and depressed mood: Secondary | ICD-10-CM | POA: Insufficient documentation

## 2015-04-13 DIAGNOSIS — C771 Secondary and unspecified malignant neoplasm of intrathoracic lymph nodes: Secondary | ICD-10-CM | POA: Diagnosis not present

## 2015-04-13 DIAGNOSIS — C7800 Secondary malignant neoplasm of unspecified lung: Secondary | ICD-10-CM | POA: Diagnosis not present

## 2015-04-13 NOTE — Telephone Encounter (Signed)
per pof to sch pt appt-gave pt copy of avs °
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
## 2015-04-13 NOTE — Progress Notes (Signed)
[place]  [phone]) [phone] [phone]  Clinic Follow Up Note   Patient Care Team: Pcp Not In System as PCP - [facility] Tanda Rockers, MD as Consulting Physician (Pulmonary Disease) 04/13/2015  CHIEF COMPLAINTS:  Recurrent breast cancer    Breast cancer metastasized to lung [facility])   12/23/2009 Surgery left mastectomy with axillary node dissection. Deep surgical margin were positive, pT2N1, tumor size 3.7 x 3.0 x 2.2 cm, grade 3, 2 out of 10 lymph nodes were positive for tumor cells. Patient declined adjuvant chemotherapy, radiation and endocrin therapy   04/30/2014 Progression She presented with worsening cough and dyspnea. CT on 05/01/2015 showed right hilar mass with compression of the SVC and right upper lobe pulmonary artery and obstruction of the right middle lobe bronchus. Moderate pericardial effusion   05/05/2014 Pathology Results Endobronchial biopsy of right lower lobe showed poorly differentiated adenocarcinoma, estrogen receptor positive, CK AE1/AE3 positive. Negative for CK 7, CK 20, TTF-1, S100,etc.  HER2(-). Pericardium biopsy negative.   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 CT abdomen and pelvis is negative for mets.    08/17/2014 - 09/18/2014 Anti-estrogen oral therapy Letrozole 44m daily and palbociclib 1238mdaily 3 weeks on 1 week off, starting 08/18/2014, palbo was held for second cycle due to AEs (dizziness, fatigue and nose bleeding), Letrozole was held also due to AEs.    09/30/2014 - 10/19/2014 Anti-estrogen oral therapy She subsequently tried anastrozole and tamoxifen, had a severe side effects, such as dizziness, fatigue, mood swings, after the first few doses, could not tolerate  and stopped.   12/09/2014 Imaging CT chest abdomen and pelvis showed radiation changes in the right perihilar region was decreased in size of a right middle lobe mass, scatter right upper lobe pulmonary nodules, stable overall.   12/09/2014 Imaging Bone scan showed stable mild increased uptake in the lateral left fifth and a right sixth ribs, stable overall.   02/18/2015 - 03/05/2015 Anti-estrogen oral therapy She tried fulvestrant to 50 mg injection for 2 doses, could not tolerate and stopped.      HISTORY OF PRESENTING ILLNESS:  KaEvgenia Merriman59o. female is here because of recently diagnosed metastatic recurrent breast cancer.  She was diagnosed with pT2 N1M0, stage IIB, left breast cancer in August 2011. She [facility], and declined adjuvant chemotherapy and endocrine therapy. She has not followed with physician regularly after that and does not have annual mammogram.  She started having dry cough and dyspnea on exertion in May 2016. She had multiple ED visit since then and was treated with multiple courses of antibiotics for bronchitis, which really did not help. In December 2015, she called the bowel pulmonary clinic and was evaluated for worsening cough and dyspnea. A CT of chest was obtained which showed a right hilar mass which cause compression on SVC and right upper lobe pulmonary artery and obstruction of right middle lobe. Moderate pericardial effusion was noticed. She underwent bronchoscopy and pericardial window on 05/01/2014. Biopsy of the right endobronchial lesion showed poorly differentiated adenoma, consistent with breast primary. Pericardial window biopsy and cytology was negative for malignancy.  She started palliative radiation on 06/10/2014, and completed on 06/29/2014, a total of 14 sessions. She tolerated well initially,  but developed severe substernal chest pain and dysphagia at the last week of radiation. She still has quite a bit chest pain, is taking Bumex and oxycodone, which helps only some. She is able to eat soft food diet and adequate. She lost about 7 pounds during the radiation. Her dyspnea and cough has significantly improved after radiation.  CURRENT TREATMENT: Supportive care  INTERIM HISTORY: She returns for follow up. She was scheduled to see me this Friday, but she called this morning reporting worsening chest pain and dyspnea. She is accompanied by her friend to the clinic today. She states she has been having intermittent frontal chest pain, more on the right side, lost a few minutes to a few hours, in the past months. She also reports shoulder breasts sometime, nonexertional, and resolves on its own. She is able to tolerate routine activity, but is very concerned about her chest pain and dyspnea. She saw a psychiatrist Mrs. Rica Records yesterday, and like to her, and would like to continue following her. No medication was prescribed yesterday.    MEDICAL HISTORY:  Past Medical History  Diagnosis Date  . Breast cancer ([place])   . Breast cancer ([place])   . Lung mass     SURGICAL HISTORY: Past Surgical History  Procedure Laterality Date  . Mastectomy Left     2010  . Eye surgery      cataract surgery, wears contact left eye  . Subxyphoid pericardial window N/A 05/05/2014    Procedure: SUBXYPHOID PERICARDIAL WINDOW;  Surgeon: Gaye Pollack, MD;  Location: [facility] OR;  Service: Thoracic;  Laterality: N/A;  . Flexible bronchoscopy N/A 05/05/2014    Procedure: FLEXIBLE BRONCHOSCOPY;  Surgeon: Gaye Pollack, MD;  Location: [place];  Service: Thoracic;  Laterality: N/A;  . Video bronchoscopy with endobronchial ultrasound N/A 05/05/2014    Procedure: VIDEO BRONCHOSCOPY WITH ENDOBRONCHIAL ULTRASOUND;  Surgeon: Gaye Pollack, MD;  Location: [place] OR;  Service: Thoracic;  Laterality: N/A;    SOCIAL  HISTORY: Social History   Social History  . Marital Status:  divorced     Spouse Name: N/A  . Number of Children:  she has one daughter who lives in [place]   . Years of Education: N/A   Occupational History  . Works PT reception    Social History Main Topics  . Smoking status: Never Smoker   . Smokeless tobacco: Never Used  . Alcohol Use: 0.0 oz/week    0 Standard drinks or equivalent per week     Comment: occ  . Drug Use: No  . Sexual Activity: Not Currently   Other Topics Concern  . Not on file   Social History Narrative    FAMILY HISTORY: Family History  Problem Relation Age of Onset  . Cancer Paternal Aunt     leukemia  . Cancer Father 77    prostate cancer     ALLERGIES:  is allergic to iopamidol; contrast media; prednisone; and zyrtec.  MEDICATIONS:  Current Outpatient Prescriptions  Medication Sig Dispense Refill  . ALPRAZolam (XANAX) 0.5 MG tablet Take 1 tablet (0.5 mg total) by mouth 3 (three) times daily as needed for anxiety. 60 tablet 0  . calcium carbonate (OS-CAL - DOSED IN MG OF ELEMENTAL CALCIUM) 1250 (500 CA) MG tablet Take 1 tablet by mouth daily with breakfast.    . Cholecalciferol (VITAMIN D3) 5000 UNITS CAPS  Take 1 capsule by mouth daily with supper.    Marland Kitchen LORazepam (ATIVAN) 1 MG tablet TAKE 1 TABLET EVERY 8 HOURS AS NEEDED PANIC ATTACKS 30 tablet 0  . naproxen sodium (ANAPROX) 220 MG tablet Take 220 mg by mouth 2 (two) times daily as needed (pain).    Marland Kitchen omeprazole (PRILOSEC) 20 MG capsule Take 1 capsule (20 mg total) by mouth daily. (Patient not taking: Reported on 03/18/2015) 30 capsule 1  . oxyCODONE (OXY IR/ROXICODONE) 5 MG immediate release tablet Take 1-2 tablets (5-10 mg total) by mouth every 4 (four) hours as needed for severe pain. (Patient not taking: Reported on 03/05/2015) 30 tablet 0  . prochlorperazine (COMPAZINE) 10 MG tablet Take 1 tablet (10 mg total) by mouth every 6 (six) hours as needed for nausea or vomiting. (Patient not taking:  Reported on 03/05/2015) 30 tablet 0   No current facility-administered medications for this visit.    REVIEW OF SYSTEMS:   Constitutional: Denies fevers, chills or abnormal night sweats. (+) fatigue  Eyes: Denies blurriness of vision, double vision or watery eyes Ears, nose, mouth, throat, and face: Denies mucositis or sore throat Respiratory:cough and dyspnea has significantly improved after radiation, no wheezes Cardiovascular: (+) mid chest pain, Denies palpitation, or lower extremity swelling Gastrointestinal:  Denies nausea, heartburn or change in bowel habits Skin: Denies abnormal skin rashes Lymphatics: Denies new lymphadenopathy or easy bruising Neurological:Denies numbness, tingling or new weaknesses Behavioral/Psych: Mood is stable, no new changes  All other systems were reviewed with the patient and are negative.  PHYSICAL EXAMINATION: ECOG PERFORMANCE STATUS: 2  Filed Vitals:   04/13/15 1538  BP: 92/59  Pulse: 87  Temp: 97.7 F (36.5 C)  Resp: 18   Filed Weights   04/13/15 1538  Weight: 107 lb 4.8 oz (48.671 kg)    her pulse ox remained to be 98% on room year after 2 minutes of walking   GENERAL:alert,  breathing comfortably, no tachypnea SKIN: skin color, texture, turgor are normal, no rashes or significant lesions EYES: normal, conjunctiva are pink and non-injected, sclera clear OROPHARYNX:no exudate, no erythema and lips, buccal mucosa, and tongue normal  NECK: supple, thyroid normal size, non-tender, without nodularity LYMPH:  no palpable lymphadenopathy in the cervical, axillary or inguinal LUNGS: clear to auscultation and percussion with normal breathing effort HEART: regular rate & rhythm and no murmurs and no lower extremity edema ABDOMEN:abdomen soft, non-tender and normal bowel sounds Musculoskeletal:no cyanosis of digits and no clubbing  PSYCH: alert & oriented x 3 with fluent speech NEURO: no focal motor/sensory deficits  LABORATORY DATA:  I  have reviewed the data as listed CBC Latest Ref Rng 03/05/2015 02/18/2015 02/11/2015  WBC 3.9 - 10.3 10e3/uL 4.6 4.6 4.3  Hemoglobin 11.6 - 15.9 g/dL 13.6 13.6 13.7  Hematocrit 34.8 - 46.6 % 39.8 39.8 40.3  Platelets 145 - 400 10e3/uL 232 221 212    CMP Latest Ref Rng 03/05/2015 02/18/2015 01/09/2015  Glucose 70 - 140 mg/dl 92 80 84  BUN 7.0 - 26.0 mg/dL 16.3 16.3 20  Creatinine 0.6 - 1.1 mg/dL 1.0 0.9 0.83  Sodium 136 - 145 mEq/L 143 142 141  Potassium 3.5 - 5.1 mEq/L 4.2 4.6 4.3  Chloride 101 - 111 mmol/L - - 105  CO2 22 - 29 mEq/L 28 29 28   Calcium 8.4 - 10.4 mg/dL 10.1 9.7 9.7  Total Protein 6.4 - 8.3 g/dL 7.1 6.8 -  Total Bilirubin 0.20 - 1.20 mg/dL 0.35 0.33 -  Alkaline Phos  40 - 150 U/L 85 86 -  AST 5 - 34 U/L 23 21 -  ALT 0 - 55 U/L 14 11 -       PATHOLOGY REPORT: Diagnosis 05/05/2014 1. Pericardium, biopsy - BENIGN MESOTHELIAL LINED FIBROADIPOSE TISSUE. 2. Endobronchial biopsy, right middle lobe - POORLY DIFFERENTIATED CARCINOMA - SEE COMMENT. 3. Endobronchial biopsy, right lower lobe - POORLY DIFFERENTIATED CARCINOMA. - SEE COMMENT. 1 of 2 FINAL for JEANIA, NATER ([id number]) Microscopic Comment 2. , 3. The malignant cells are positive for cytokeratin AE1/AE3 and estrogen receptor. They are negative for cytokeratin 7, cytokeratin 20, S-100, GCDFP, CD45, chromogranin, cytokeratin 5/6, synaptophysin, TTF-1. This immunohistochemical profile is not specific. However, given the patient's history, the positive staining for estrogen receptor is most suggestive of metastatic primary breast carcinoma. A HER-2 will be attempted and the results reportedly separately. (JBK:ecj 05/11/2014)  Results: HER-2/NEU BY CISH - NO AMPLIFICATION OF HER-2 DETECTED. RESULT RATIO OF HER2: CEP 17 SIGNALS 0.94 AVERAGE HER2 COPY NUMBER PER CELL 1.50  RADIOGRAPHIC STUDIES: I have personally reviewed the radiological images as listed and agreed with the findings in the report.  CT chest  05/01/2015  IMPRESSION: 1. Negative for pulmonary embolus. 2. Right hilar mass with compression of the SVC and right upper lobe pulmonary artery and obstruction of the right middle lobe bronchus. Right middle lobe mass and postobstructive collapse. Findings are highly worrisome for primary bronchogenic carcinoma. 3. Possible satellite right lower lobe nodule. 4. Moderate pericardial effusion. 5. Scattered small nodular and ground-glass opacities in the right upper lobe, likely infectious or inflammatory in etiology.  CT chest w contrast 07/03/2014 IMPRESSION: Interval decrease in sizes of RIGHT upper lobe mass, RIGHT hilar adenopathy and mediastinal adenopathy.  RIGHT lower lobe pulmonary nodule decreased in size with a single RIGHT apex nodule minimally increased in size since previous study.   Electronically Signed   By: Lavonia Dana M.D.   On: 07/03/2014 10:48   CT abdomen and pelvis with contrast 12/09/2014 IMPRESSION: 1. Evolutionary changes of radiation therapy in the right perihilar region with decrease in size of a right middle lobe metastasis. 2. Scattered right upper lobe pulmonary nodules, some decreased in size, at least 1 is larger. 3. Small loculated right pleural effusion. 4. Focal anterior pericardial fluid, new. 5. No acute or metastatic findings in the abdomen or pelvis.  Bone scan 12/09/2014 IMPRESSION: 1. There is stable mildly increased uptake in the lateral aspects of the left fifth and right sixth ribs. This is stable. No other abnormal rib activity is observed. Review of the bone windows from a chest CT scan of today's date reveals no rib lesions. 2. There are no findings elsewhere in the skeleton suspicious for metastatic disease.  CT chest 01/02/2015 IMPRESSION: 1. No evidence of acute pulmonary embolus. 2. Chronic post treatment related architectural distortion in the right lung. Areas of abnormal right lung opacity appears stable since August except  for increased nodularity along the right major fissure, see #3. 3. Increased but still small layering right pleural effusion. This constellation is suspicious for progressive pleural metastases.  Echo 01/19/2015 Study Conclusions  - Left ventricle: The cavity size was normal. Wall thickness was normal. Systolic function was normal. The estimated ejection fraction was in the range of 55% to 60%. Wall motion was normal; there were no regional wall motion abnormalities. -Pericardium: There was no pericardial effusion.  ASSESSMENT & PLAN:  59 year old female with past medical history of stage IIB left breast cancer, status post mastectomy and  axillary lymph node dissection in 2011. Now presented with metastatic disease to lungs and lymph nodes.  1. 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 She never smoked, and immunostain does not support primary lung cancer. -We discussed the natural history of her cancer, unfortunately this is incurable at this stage, but treatable and we do have many treatment options.  -She has had good response to palliative radiation with significant symptom improvement. -PET scan was denied by her insurance. Bone scan showed a possible left lateral rib metastasis. CT of the abdomen was negative for other metastasis. -She has tried letrozole, anastrozole, tamoxifen and fulvestrant, but could not tolerate.  -We reviewed her recent CT chest again, she has slightly increased pleural effusion and pulmonary nodules, likely slow disease progression.  -We discussed the other treatment option, which would be chemotherapy. Giving her intolerance to antiestrogen therapy, I think her tolerance to chemotherapy would be quite low. She is not very somatic, I recommend to continue  observation for now.  2. Chest pain and dyspnea  -Possibly related to her endobronchial metastasis, and her anxiety. -Her lung exam is normal, pulse ox on exertion is normal. I think some of these symptoms are related to her anxiety also -I recommend her to follow-up with pulmonologist Dr. Melvyn Novas, to see if she needs a repeat bronchoscopy. -Okay to continue Advil 2 tablets 1-2 times a day as needed for the chest pain  3. Depression and anxiety -She'll continue follow-up with her psychiatrist Ms Elisabeth Cara   4. Frequent panic attack -She is on Xanax 0.5 mg every 6-8 hours as needed, she takes about 3 tablets a day -she has Ativan 1 mg, one to 2 tablets every 8 hours as needed for panic attack.  5. 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 -She has a mild dyspnea on exertion, overall her symptoms has significant improved after radiation. -Restaging CT scan in March 2016 showed significant improvement of her lung lesion, right hilar and mediastinal adenopathy. -follow up with Dr. Melvyn Novas and repeat bronchoscopy if needed.  6.  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 -May consider Xgeva monthly. Due to her unusual reaction to medications and recent dental issue, I have not started her Delton See yet.  Plan: -Return to clinic in 3 Weeks for folllow up and lab  -follow up with Dr. Melvyn Novas for her dyspnea    All questions were answered. The patient knows to call the clinic with any problems, questions or concerns. I spent 25 minutes counseling the patient face to face. The total time spent in the appointment was 30 minutes and more than 50% was on counseling.     Truitt Merle, MD 04/13/2015 4:53 PM

## 2015-04-13 NOTE — Telephone Encounter (Signed)
Pt called and lvm that she is very anxious and needs reassurance about the plan of care for "the collapsed lung". She is a bit more SOB and the chest pain is worse at times. Her next appt is this Friday. S/w Dr Burr Medico and she can see pt today at 2 or 330. Called pt back and she will get someone to bring her for the 330 slot.

## 2015-04-16 ENCOUNTER — Encounter: Payer: Self-pay | Admitting: Internal Medicine

## 2015-04-16 ENCOUNTER — Other Ambulatory Visit: Payer: Medicaid Other

## 2015-04-16 ENCOUNTER — Ambulatory Visit (INDEPENDENT_AMBULATORY_CARE_PROVIDER_SITE_OTHER)
Admission: RE | Admit: 2015-04-16 | Discharge: 2015-04-16 | Disposition: A | Discharge status: Home / Self Care | Payer: Medicaid Other | Source: Ambulatory Visit | Attending: Internal Medicine | Admitting: Internal Medicine

## 2015-04-16 ENCOUNTER — Ambulatory Visit: Payer: Medicaid Other | Admitting: Hematology

## 2015-04-16 ENCOUNTER — Ambulatory Visit (INDEPENDENT_AMBULATORY_CARE_PROVIDER_SITE_OTHER): Payer: Medicaid Other | Admitting: Internal Medicine

## 2015-04-16 DIAGNOSIS — R0600 Dyspnea, unspecified: Secondary | ICD-10-CM

## 2015-04-16 MED ORDER — FAMOTIDINE 20 MG PO TABS: ORAL_TABLET | ORAL | Status: DC

## 2015-04-16 MED ORDER — PANTOPRAZOLE SODIUM 40 MG PO TBEC: 40.0000 mg | DELAYED_RELEASE_TABLET | Freq: Every day | ORAL | Status: DC

## 2015-04-16 NOTE — Progress Notes (Signed)
Subjective:     Patient ID: Patricia Carter, female   DOB: 06/10/1955,    MRN: [id number]  HPI  71 yowf never smoker s/p L mastectomy 2010 no adjuvant therapy they recommended both but didn't complete it new onset cough 08/2013 indolent onset daily since eval in [facility] dx bronchitis abx/ pred and no better and gradually worse so self referred  To pulmonary clinic 04/29/2014 with dx of met breast ca to lung      Breast cancer metastasized to lung [facility])    12/23/2009  Surgery  left mastectomy with axillary node dissection. Deep surgical margin were positive, pT2N1, tumor size 3.7 x 3.0 x 2.2 cm, grade 3, 2 out of 10 lymph nodes were positive for tumor cells. Patient declined adjuvant chemotherapy, radiation and endocrin therapy    04/30/2014  Progression  She presented with worsening cough and dyspnea. CT on 05/01/2015 showed right hilar mass with compression of the SVC and right upper lobe pulmonary artery and obstruction of the right middle lobe bronchus. Moderate pericardial effusion    05/05/2014  Pathology Results  Endobronchial biopsy of right lower lobe showed poorly differentiated adenocarcinoma, estrogen receptor positive, CK AE1/AE3 positive. Negative for CK 7, CK 20, TTF-1, S100,etc. HER2(-). Pericardium biopsy negative.    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 CT abdomen and pelvis is negative for mets.    08/17/2014 - 09/18/2014  Anti-estrogen oral therapy  Letrozole 32m daily and palbociclib 1263mdaily 3 weeks on 1 week off, starting 08/18/2014, palbo was held for second cycle due to AEs (dizziness, fatigue and nose bleeding), Letrozole was held also due to AEs.    09/30/2014 - 10/19/2014  Anti-estrogen oral therapy  She subsequently tried anastrozole  and tamoxifen, had a severe side effects, such as dizziness, fatigue, mood swings, after the first few doses, could not tolerate and stopped.    12/09/2014  Imaging  CT chest abdomen and pelvis showed radiation changes in the right perihilar region was decreased in size of a right middle lobe mass, scatter right upper lobe pulmonary nodules, stable overall.    12/09/2014  Imaging  Bone scan showed stable mild increased uptake in the lateral left fifth and a right sixth ribs, stable overall.    02/18/2015 - 03/05/2015  Anti-estrogen oral therapy  She tried fulvestrant to 50 mg injection for 2 doses, could not tolerate and stopped.            04/16/2015  f/u ov/Dinh Ayotte re: recurrent sob  Chief Complaint  Patient presents with  . Follow-up    Pt c/o dry cough, some wheeze, SOB with exertion and CP/tightness with anxiety/nervousness. Pt states that she "doesn't feel right".     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 No unusual exp hx or h/o childhood pna/ asthma or knowledge of premature birth.  Sleeping ok  without nocturnal  or early am exacerbation  of respiratory  c/o's or need for noct saba. Also denies any obvious fluctuation of symptoms with weather or environmental changes or other aggravating or alleviating factors except as outlined above   Current Medications, Allergies, Complete Past Medical History, Past Surgical History, Family History, and Social History were reviewed in [employer] record.  ROS  The following are not active complaints unless bolded sore throat, dysphagia, dental problems, itching, sneezing,  nasal congestion or excess/  purulent secretions, ear ache,   fever, chills, sweats, unintended wt loss,  hemoptysis,  orthopnea pnd or leg swelling, presyncope, palpitations, heartburn, abdominal pain, anorexia, nausea, vomiting, diarrhea  or change in bowel or urinary habits, change in stools or urine, dysuria,hematuria,  rash, arthralgias, visual complaints, headache, numbness weakness or ataxia or problems with walking or coordination,  change in mood/affect or memory.            Objective:   Physical Exam  amb wf nad  But quite anxious   Wt Readings from Last 3 Encounters:  04/16/15 110 lb (49.896 kg)  04/13/15 107 lb 4.8 oz (48.671 kg)  03/18/15 105 lb 11.2 oz (47.945 kg)    Vital signs reviewed     HEENT: nl dentition, turbinates, and orophanx. Nl external ear canals without cough reflex   NECK :  without JVD/Nodes/TM/ nl carotid upstrokes bilaterally   LUNGS: no acc muscle use,  Minimal decreased bs at right base     CV:  RRR  no s3 or murmur or increase in P2, no edema   ABD:  soft and nontender with nl excursion in the supine position. No bruits or organomegaly, bowel sounds nl  MS:  warm without deformities, calf tenderness, cyanosis or clubbing  SKIN: warm and dry without lesions    NEURO:  alert, approp, no deficits      CXR PA and Lateral:   04/16/2015 :    I personally reviewed images and agree with radiology impression as follows:   Slight interval improvement in the volume of the right pleural effusion. Persistent atelectasis or post radiation fibrotic change in the right perihilar region. There is no alveolar pneumonia nor CHF.     Assessment:

## 2015-04-16 NOTE — Patient Instructions (Addendum)
Try protonix (pantoprazole) 40 mg Take one x 30-60 min before first meal of the day and Pepcid 20 mg one bedtime until  Return  GERD (REFLUX)  is an extremely common cause of respiratory symptoms just like yours , many times with no obvious heartburn at all.    It can be treated with medication, but also with lifestyle changes including avoidance of late meals, excessive alcohol, smoking cessation, and avoid fatty foods, chocolate, peppermint, colas, red wine, and acidic juices such as orange juice.  NO MINT OR MENTHOL PRODUCTS SO NO COUGH DROPS  USE SUGARLESS CANDY INSTEAD (Jolley ranchers or Stover's or Life Savers) or even ice chips will also do - the key is to swallow to prevent all throat clearing. NO OIL BASED VITAMINS - use powdered substitutes.  Please remember to go to the x-ray department downstairs for your tests - we will call you with the results when they are available.     Please schedule a follow up office visit in 4 weeks, sooner if needed

## 2015-04-16 NOTE — Progress Notes (Signed)
Quick Note:  Spoke with pt and notified of results per Dr. Wert. Pt verbalized understanding and denied any questions.  ______ 

## 2015-04-17 ENCOUNTER — Encounter: Payer: Self-pay | Admitting: Internal Medicine

## 2015-04-18 ENCOUNTER — Encounter: Payer: Self-pay | Admitting: Internal Medicine

## 2015-04-18 NOTE — Assessment & Plan Note (Signed)
-  04/29/2014  Walked RA x 3 laps @ 185 ft each stopped due to  End of study no desat and min sob  - 04/29/2014 spirometry abn f/v contour but min obstruction off saba x 12 h - Echo 01/19/15 - Left ventricle: The cavity size was normal. Wall thickness was normal. Systolic function was normal. The estimated ejection fraction was in the range of 55% to 60%. Wall motion was normal; there were no regional wall motion abnormalities. - 04/16/2015  Walked RA x 3 laps @ 185 ft each stopped due to  End of study, nl pace,  min sob / no desat  Nl pace  She has what appears to be residual scarring R chest s/p RT for met breast ca but Symptoms are markedly disproportionate to objective findings and not clear this is a lung problem but pt does appear to have difficult airway management issues. DDX of  difficult airways management all start with A and  include Adherence, Ace Inhibitors, Acid Reflux, Active Sinus Disease, Alpha 1 Antitripsin deficiency, Anxiety masquerading as Airways dz,  ABPA,  allergy(esp in young), Aspiration (esp in elderly), Adverse effects of meds,  Active smokers, A bunch of PE's (a small clot burden can't cause this syndrome unless there is already severe underlying pulm or vascular dz with poor reserve) plus two Bs  = Bronchiectasis and Beta blocker use..and one C= CHF   Adherence is always the initial "prime suspect" and is a multilayered concern that requires a "trust but verify" approach in every patient - starting with knowing how to use medications, especially inhalers, correctly, keeping up with refills and understanding the fundamental difference between maintenance and prns vs those medications only taken for a very short course and then stopped and not refilled.   ? Acid (or non-acid) GERD > always difficult to exclude as up to 75% of pts in some series report no assoc GI/ Heartburn symptoms> rec max (24h)  acid suppression and diet restrictions/ reviewed and instructions given  in writing.   ? Anxiety > usually at the bottom of this list of usual suspects but should be much higher on this pt's based on H and P and note already on psychotropics with both xanax and ativan which in my experience lead to greater anxiolytic dependency since both treat the symptoms without treating the underlying dz so may need to consider pysch referral if not already established.   I would like to see her back in 4 week with repeat walking study, spirometry but no other thought for now  I had an extended discussion with the patient reviewing all relevant studies completed to date and  lasting 25 minutes of a 40 minute visit to re-establish care.     Each maintenance medication was reviewed in detail including most importantly the difference between maintenance and prns and under what circumstances the prns are to be triggered using an action plan format that is not reflected in the computer generated alphabetically organized AVS.    Please see instructions for details which were reviewed in writing and the patient given a copy highlighting the part that I personally wrote and discussed at today's ov.

## 2015-04-30 ENCOUNTER — Ambulatory Visit (INDEPENDENT_AMBULATORY_CARE_PROVIDER_SITE_OTHER): Payer: Medicaid Other | Admitting: Licensed Clinical Social Worker

## 2015-04-30 DIAGNOSIS — F4323 Adjustment disorder with mixed anxiety and depressed mood: Secondary | ICD-10-CM

## 2015-04-30 NOTE — Progress Notes (Signed)
   THERAPIST PROGRESS NOTE  Session Time: 9:20-10:25am  Participation Level: Active  Behavioral Response: CasualAlertHopeless  Type of Therapy: Individual Therapy  Treatment Goals addressed: Developed treatment goals today  Interventions: Treatment planning and mindfulness  Suicidal/Homicidal: Denied both  Therapist Interventions:  Collaborated with patient to develop her treatment plan.  Identified long term and short term goals.   Introduced the concept of mindfulness.  Explained the differences between two modes of thinking: doing mode and being mode.  Provided examples of when each mode of thinking tends to be most effective.  Emphasized how learning to focus on the present can help you to feel more in control of your emotions.  Explained how it can be useful to practice at times when you catch yourself having unhelpful thoughts.  Discussed how using mindfulness can potentially help her cope with the fact that she is experiencing unpleasant thoughts and feelings regarding her cancer. Provided patient with 4 pages to read for homework about mindfulness.     Summary:  Arrived late.  Admitted anxiety was a factor in her tardiness.  Not enthusiastic about doing a treatment plan.  Noted that she has not had good experiences when it comes to developing treatment plans with her oncologist.  Michela Pitcher they have had 7 treatment plans already. Revealed that the main reason she sought traditional medical treatment for her cancer was to please her daughter. Would have prefer to use alternative medicine. Indicated she has a lot of anger about the way individuals in the medical field are treating cancer patients and that overall they are causing more harm than good. Seemed only somewhat receptive to the potential usefulness of mindfulness. Agreed to read the materials therapist provided.  Expressed some appreciation for the interventions therapist provided saying, "I guess what I've gotten out of today is  that it's okay to feel the feelings I have been feeling."   Plan: Return again in one to two weeks.  Will expand on how to practice mindfulness.    Diagnosis: Adjustment disorder with anxiety and depressed mood    Armandina Stammer 04/30/2015

## 2015-05-03 ENCOUNTER — Other Ambulatory Visit: Payer: Self-pay | Admitting: Hematology

## 2015-05-04 ENCOUNTER — Telehealth: Payer: Self-pay | Admitting: Hematology

## 2015-05-04 ENCOUNTER — Encounter: Payer: Self-pay | Admitting: Hematology

## 2015-05-04 ENCOUNTER — Ambulatory Visit (HOSPITAL_BASED_OUTPATIENT_CLINIC_OR_DEPARTMENT_OTHER): Payer: Medicaid Other | Admitting: Hematology

## 2015-05-04 ENCOUNTER — Other Ambulatory Visit (HOSPITAL_BASED_OUTPATIENT_CLINIC_OR_DEPARTMENT_OTHER): Payer: Medicaid Other

## 2015-05-04 VITALS — BP 105/75 | HR 84 | Temp 98.0°F | Resp 16 | Ht 63.0 in | Wt 107.2 lb

## 2015-05-04 DIAGNOSIS — R0600 Dyspnea, unspecified: Secondary | ICD-10-CM | POA: Diagnosis not present

## 2015-05-04 DIAGNOSIS — C50919 Malignant neoplasm of unspecified site of unspecified female breast: Secondary | ICD-10-CM

## 2015-05-04 DIAGNOSIS — F410 Panic disorder [episodic paroxysmal anxiety] without agoraphobia: Secondary | ICD-10-CM | POA: Diagnosis not present

## 2015-05-04 DIAGNOSIS — C7951 Secondary malignant neoplasm of bone: Secondary | ICD-10-CM

## 2015-05-04 DIAGNOSIS — F419 Anxiety disorder, unspecified: Secondary | ICD-10-CM | POA: Diagnosis not present

## 2015-05-04 DIAGNOSIS — R0789 Other chest pain: Secondary | ICD-10-CM | POA: Diagnosis not present

## 2015-05-04 DIAGNOSIS — C7800 Secondary malignant neoplasm of unspecified lung: Principal | ICD-10-CM

## 2015-05-04 DIAGNOSIS — C771 Secondary and unspecified malignant neoplasm of intrathoracic lymph nodes: Secondary | ICD-10-CM

## 2015-05-04 LAB — COMPREHENSIVE METABOLIC PANEL
ALBUMIN: 4.1 g/dL (ref 3.5–5.0)
ALK PHOS: 87 U/L (ref 40–150)
ALT: 16 U/L (ref 0–55)
ANION GAP: 8 meq/L (ref 3–11)
AST: 22 U/L (ref 5–34)
BUN: 11.2 mg/dL (ref 7.0–26.0)
CALCIUM: 9.7 mg/dL (ref 8.4–10.4)
CO2: 30 mEq/L — ABNORMAL HIGH (ref 22–29)
Chloride: 103 mEq/L (ref 98–109)
Creatinine: 1 mg/dL (ref 0.6–1.1)
EGFR: 65 mL/min/{1.73_m2} — AB (ref >90)
GLUCOSE: 73 mg/dL (ref 70–140)
Potassium: 3.9 mEq/L (ref 3.5–5.1)
Sodium: 141 mEq/L (ref 136–145)
TOTAL PROTEIN: 7.3 g/dL (ref 6.4–8.3)

## 2015-05-04 MED ORDER — LORAZEPAM 1 MG PO TABS: ORAL_TABLET | ORAL | Status: DC

## 2015-05-04 MED ORDER — ALPRAZOLAM 0.5 MG PO TABS: 0.5000 mg | ORAL_TABLET | Freq: Three times a day (TID) | ORAL | Status: DC | PRN

## 2015-05-04 NOTE — Telephone Encounter (Signed)
per pof to sch pt appt-gave pt copy of avs °

## 2015-05-04 NOTE — Progress Notes (Signed)
[place]  Telephone:([phone]) [phone] Fax:([phone]) [phone]  Clinic Follow Up Note   Patient Care Team: Pcp Not In System as PCP - [facility] Tanda Rockers, MD as Consulting Physician (Pulmonary Disease) 05/04/2015  CHIEF COMPLAINTS:  Recurrent breast cancer    Breast cancer metastasized to lung [facility])   12/23/2009 Surgery left mastectomy with axillary node dissection. Deep surgical margin were positive, pT2N1, tumor size 3.7 x 3.0 x 2.2 cm, grade 3, 2 out of 10 lymph nodes were positive for tumor cells. Patient declined adjuvant chemotherapy, radiation and endocrin therapy   04/30/2014 Progression She presented with worsening cough and dyspnea. CT on 05/01/2015 showed right hilar mass with compression of the SVC and right upper lobe pulmonary artery and obstruction of the right middle lobe bronchus. Moderate pericardial effusion   05/05/2014 Pathology Results Endobronchial biopsy of right lower lobe showed poorly differentiated adenocarcinoma, estrogen receptor positive, CK AE1/AE3 positive. Negative for CK 7, CK 20, TTF-1, S100,etc.  HER2(-). Pericardium biopsy negative.   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 CT abdomen and pelvis is negative for mets.    08/17/2014 - 09/18/2014 Anti-estrogen oral therapy Letrozole 40m daily and palbociclib 1248mdaily 3 weeks on 1 week off, starting 08/18/2014, palbo was held for second cycle due to AEs (dizziness, fatigue and nose bleeding), Letrozole was held also due to AEs.    09/30/2014 - 10/19/2014 Anti-estrogen oral therapy She subsequently tried anastrozole and tamoxifen, had a severe side effects, such as dizziness, fatigue, mood swings, after the first few doses, could not tolerate  and stopped.   12/09/2014 Imaging CT chest abdomen and pelvis showed radiation changes in the right perihilar region was decreased in size of a right middle lobe mass, scatter right upper lobe pulmonary nodules, stable overall.   12/09/2014 Imaging Bone scan showed stable mild increased uptake in the lateral left fifth and a right sixth ribs, stable overall.   02/18/2015 - 03/05/2015 Anti-estrogen oral therapy She tried fulvestrant to 50 mg injection for 2 doses, could not tolerate and stopped.      HISTORY OF PRESENTING ILLNESS (07/14/2014):  Patricia Hirt89.o. female is here because of recently diagnosed metastatic recurrent breast cancer.  She was diagnosed with pT2 N1M0, stage IIB, left breast cancer in August 2011. [facility], and declined adjuvant chemotherapy and endocrine therapy. She has not followed with physician regularly after that and does not have annual mammogram.  She started having dry cough and dyspnea on exertion in May 2016. She had multiple ED visit since then and was treated with multiple courses of antibiotics for bronchitis, which really did not help. In December 2015, she called the bowel pulmonary clinic and was evaluated for worsening cough and dyspnea. A CT of chest was obtained which showed a right hilar mass which cause compression on SVC and right upper lobe pulmonary artery and obstruction of right middle lobe. Moderate pericardial effusion was noticed. She underwent bronchoscopy and pericardial window on 05/01/2014. Biopsy of the right endobronchial lesion showed poorly differentiated adenoma, consistent with breast primary. Pericardial window biopsy and cytology was negative for malignancy.  She started palliative radiation on 06/10/2014, and completed on 06/29/2014, a total of 14 sessions. She tolerated well  initially, but developed severe substernal chest pain and dysphagia at the last week of radiation. She still has quite a bit chest pain, is taking Bumex and oxycodone, which helps only some. She is able to eat soft food diet and adequate. She lost about 7 pounds during the radiation. Her dyspnea and cough has significantly improved after radiation.  CURRENT TREATMENT: Supportive care  INTERIM HISTORY: She returns for follow up. She has seen her psychiatrist twice, and her anxiety and panic attack seems slightly better. She still has episodes of panic attack, but she is able totalk herself out of it, has not gone to ED orcause daily. She was seen by pulmonologist Dr. Melvyn Novas Last months, and does not feel she needs any pulmonary work up for now. Her intermittent dyspnea is stable overall, she is able to tolerate routine physical activities. No other new complains.   MEDICAL HISTORY:  Past Medical History  Diagnosis Date  . Breast cancer ([place])   . Breast cancer ([place])   . Lung mass     SURGICAL HISTORY: Past Surgical History  Procedure Laterality Date  . Mastectomy Left     2010  . Eye surgery      cataract surgery, wears contact left eye  . Subxyphoid pericardial window N/A 05/05/2014    Procedure: SUBXYPHOID PERICARDIAL WINDOW;  Surgeon: Gaye Pollack, MD;  Location: [facility];  Service: Thoracic;  Laterality: N/A;  . Flexible bronchoscopy N/A 05/05/2014    Procedure: FLEXIBLE BRONCHOSCOPY;  Surgeon: Gaye Pollack, MD;  Location: [place];  Service: Thoracic;  Laterality: N/A;  . Video bronchoscopy with endobronchial ultrasound N/A 05/05/2014    Procedure: VIDEO BRONCHOSCOPY WITH ENDOBRONCHIAL ULTRASOUND;  Surgeon: Gaye Pollack, MD;  Location: [place] OR;  Service: Thoracic;  Laterality: N/A;    SOCIAL HISTORY: Social History   Social History  . Marital Status:  divorced     Spouse Name: N/A  . Number of Children:  she has one daughter who lives in [place]   . Years of Education: N/A    Occupational History  . Works PT reception    Social History Main Topics  . Smoking status: Never Smoker   . Smokeless tobacco: Never Used  . Alcohol Use: 0.0 oz/week    0 Standard drinks or equivalent per week     Comment: occ  . Drug Use: No  . Sexual Activity: Not Currently   Other Topics Concern  . Not on file   Social History Narrative    FAMILY HISTORY: Family History  Problem Relation Age of Onset  . Cancer Paternal Aunt     leukemia  . Cancer Father 54    prostate cancer     ALLERGIES:  is allergic to iopamidol; contrast media; prednisone; and zyrtec.  MEDICATIONS:  Current Outpatient Prescriptions  Medication Sig Dispense Refill  . ALPRAZolam (XANAX) 0.5 MG tablet Take 1 tablet (0.5 mg total) by mouth 3 (three) times daily as needed for anxiety. 60 tablet 0  . Cholecalciferol (VITAMIN D3) 5000 UNITS CAPS Take 1 capsule by mouth daily with supper.    . famotidine (PEPCID) 20 MG tablet One at bedtime 30 tablet 2  . LORazepam (ATIVAN) 1 MG tablet TAKE 1 TABLET EVERY 8 HOURS AS NEEDED PANIC ATTACKS 30 tablet 0  . naproxen sodium (ANAPROX) 220 MG tablet Take 220 mg by mouth 2 (two) times daily  as needed (pain).    Marland Kitchen oxyCODONE (OXY IR/ROXICODONE) 5 MG immediate release tablet Take 1-2 tablets (5-10 mg total) by mouth every 4 (four) hours as needed for severe pain. (Patient not taking: Reported on 03/05/2015) 30 tablet 0  . pantoprazole (PROTONIX) 40 MG tablet Take 1 tablet (40 mg total) by mouth daily. Take 30-60 min before first meal of the day 30 tablet 2   No current facility-administered medications for this visit.    REVIEW OF SYSTEMS:   Constitutional: Denies fevers, chills or abnormal night sweats. (+) fatigue  Eyes: Denies blurriness of vision, double vision or watery eyes Ears, nose, mouth, throat, and face: Denies mucositis or sore throat Respiratory:cough and dyspnea has significantly improved after radiation, no wheezes Cardiovascular: (+) mid chest  pain, Denies palpitation, or lower extremity swelling Gastrointestinal:  Denies nausea, heartburn or change in bowel habits Skin: Denies abnormal skin rashes Lymphatics: Denies new lymphadenopathy or easy bruising Neurological:Denies numbness, tingling or new weaknesses Behavioral/Psych: Mood is stable, no new changes  All other systems were reviewed with the patient and are negative.  PHYSICAL EXAMINATION: ECOG PERFORMANCE STATUS: 2  Filed Vitals:   05/04/15 1602  BP: 105/75  Pulse: 84  Temp: 98 F (36.7 C)  Resp: 16   Filed Weights   05/04/15 1602  Weight: 107 lb 3.2 oz (48.626 kg)    her pulse ox remained to be 98% on room year after 2 minutes of walking   GENERAL:alert,  breathing comfortably, no tachypnea SKIN: skin color, texture, turgor are normal, no rashes or significant lesions EYES: normal, conjunctiva are pink and non-injected, sclera clear OROPHARYNX:no exudate, no erythema and lips, buccal mucosa, and tongue normal  NECK: supple, thyroid normal size, non-tender, without nodularity LYMPH:  no palpable lymphadenopathy in the cervical, axillary or inguinal LUNGS: clear to auscultation and percussion with normal breathing effort HEART: regular rate & rhythm and no murmurs and no lower extremity edema ABDOMEN:abdomen soft, non-tender and normal bowel sounds Musculoskeletal:no cyanosis of digits and no clubbing  PSYCH: alert & oriented x 3 with fluent speech NEURO: no focal motor/sensory deficits  LABORATORY DATA:  I have reviewed the data as listed CBC Latest Ref Rng 03/05/2015 02/18/2015 02/11/2015  WBC 3.9 - 10.3 10e3/uL 4.6 4.6 4.3  Hemoglobin 11.6 - 15.9 g/dL 13.6 13.6 13.7  Hematocrit 34.8 - 46.6 % 39.8 39.8 40.3  Platelets 145 - 400 10e3/uL 232 221 212    CMP Latest Ref Rng 03/05/2015 02/18/2015 01/09/2015  Glucose 70 - 140 mg/dl 92 80 84  BUN 7.0 - 26.0 mg/dL 16.3 16.3 20  Creatinine 0.6 - 1.1 mg/dL 1.0 0.9 0.83  Sodium 136 - 145 mEq/L 143 142 141    Potassium 3.5 - 5.1 mEq/L 4.2 4.6 4.3  Chloride 101 - 111 mmol/L - - 105  CO2 22 - 29 mEq/L 28 29 28   Calcium 8.4 - 10.4 mg/dL 10.1 9.7 9.7  Total Protein 6.4 - 8.3 g/dL 7.1 6.8 -  Total Bilirubin 0.20 - 1.20 mg/dL 0.35 0.33 -  Alkaline Phos 40 - 150 U/L 85 86 -  AST 5 - 34 U/L 23 21 -  ALT 0 - 55 U/L 14 11 -    PATHOLOGY REPORT: Diagnosis 05/05/2014 1. Pericardium, biopsy - BENIGN MESOTHELIAL LINED FIBROADIPOSE TISSUE. 2. Endobronchial biopsy, right middle lobe - POORLY DIFFERENTIATED CARCINOMA - SEE COMMENT. 3. Endobronchial biopsy, right lower lobe - POORLY DIFFERENTIATED CARCINOMA. - SEE COMMENT. 1 of 2 FINAL for LABRESHA, MELLOR ([id number]) Microscopic Comment  2. , 3. The malignant cells are positive for cytokeratin AE1/AE3 and estrogen receptor. They are negative for cytokeratin 7, cytokeratin 20, S-100, GCDFP, CD45, chromogranin, cytokeratin 5/6, synaptophysin, TTF-1. This immunohistochemical profile is not specific. However, given the patient's history, the positive staining for estrogen receptor is most suggestive of metastatic primary breast carcinoma. A HER-2 will be attempted and the results reportedly separately. (JBK:ecj 05/11/2014)  Results: HER-2/NEU BY CISH - NO AMPLIFICATION OF HER-2 DETECTED. RESULT RATIO OF HER2: CEP 17 SIGNALS 0.94 AVERAGE HER2 COPY NUMBER PER CELL 1.50  RADIOGRAPHIC STUDIES: I have personally reviewed the radiological images as listed and agreed with the findings in the report.  CT chest 05/01/2015  IMPRESSION: 1. Negative for pulmonary embolus. 2. Right hilar mass with compression of the SVC and right upper lobe pulmonary artery and obstruction of the right middle lobe bronchus. Right middle lobe mass and postobstructive collapse. Findings are highly worrisome for primary bronchogenic carcinoma. 3. Possible satellite right lower lobe nodule. 4. Moderate pericardial effusion. 5. Scattered small nodular and ground-glass opacities in the  right upper lobe, likely infectious or inflammatory in etiology.  CT chest w contrast 07/03/2014 IMPRESSION: Interval decrease in sizes of RIGHT upper lobe mass, RIGHT hilar adenopathy and mediastinal adenopathy.  RIGHT lower lobe pulmonary nodule decreased in size with a single RIGHT apex nodule minimally increased in size since previous study.   Electronically Signed   By: Lavonia Dana M.D.   On: 07/03/2014 10:48   CT abdomen and pelvis with contrast 12/09/2014 IMPRESSION: 1. Evolutionary changes of radiation therapy in the right perihilar region with decrease in size of a right middle lobe metastasis. 2. Scattered right upper lobe pulmonary nodules, some decreased in size, at least 1 is larger. 3. Small loculated right pleural effusion. 4. Focal anterior pericardial fluid, new. 5. No acute or metastatic findings in the abdomen or pelvis.  Bone scan 12/09/2014 IMPRESSION: 1. There is stable mildly increased uptake in the lateral aspects of the left fifth and right sixth ribs. This is stable. No other abnormal rib activity is observed. Review of the bone windows from a chest CT scan of today's date reveals no rib lesions. 2. There are no findings elsewhere in the skeleton suspicious for metastatic disease.  CT chest 01/02/2015 IMPRESSION: 1. No evidence of acute pulmonary embolus. 2. Chronic post treatment related architectural distortion in the right lung. Areas of abnormal right lung opacity appears stable since August except for increased nodularity along the right major fissure, see #3. 3. Increased but still small layering right pleural effusion. This constellation is suspicious for progressive pleural metastases.  Echo 01/19/2015 Study Conclusions  - Left ventricle: The cavity size was normal. Wall thickness was normal. Systolic function was normal. The estimated ejection fraction was in the range of 55% to 60%. Wall motion was normal; there were no regional wall  motion abnormalities. -Pericardium: There was no pericardial effusion.  ASSESSMENT & PLAN:  60 year old female with past medical history of stage IIB left breast cancer, status post mastectomy and axillary lymph node dissection in 2011. Now presented with metastatic disease to lungs and lymph nodes.  1. 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 She never smoked, and immunostain does not support primary lung cancer. -We discussed the natural history of her cancer, unfortunately this is incurable at this stage, but  treatable and we do have many treatment options.  -She has had good response to palliative radiation with significant symptom improvement. -PET scan was denied by her insurance. Bone scan showed a possible left lateral rib metastasis. CT of the abdomen was negative for other metastasis. -She has tried letrozole, anastrozole, tamoxifen and fulvestrant, but could not tolerate.  -We previously discussed the other treatment option, which would be chemotherapy. Giving her intolerance to antiestrogen therapy, I think her tolerance to chemotherapy would be quite low. She is not very symptomatic, I recommend to continue observation for now. -she has been clinically stable, lab reviewed which are normal, will continue observation -I will repeat her CT CAP in a few month, will coordinate with her Wert   2. Chest pain and dyspnea  -Possibly related to her endobronchial metastasis, and her anxiety. -Her lung exam is normal, pulse ox on exertion is normal. I think some of these symptoms are related to her anxiety also -she will continue following up with dr. Melvyn Novas   3. Depression and anxiety -She'll continue follow-up with her psychiatrist Ms Elisabeth Cara   4. Frequent panic attack -She is on Xanax 0.5  mg every 6-8 hours as needed, she takes about 3 tablets a day -she has Ativan 1 mg, one to 2 tablets every 8 hours as needed for panic attack.  5. 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 -She has a mild dyspnea on exertion, overall her symptoms has significant improved after radiation. -Restaging CT scan in March 2016 showed significant improvement of her lung lesion, right hilar and mediastinal adenopathy. -follow up with Dr. Melvyn Novas and repeat bronchoscopy if needed.  6.  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 -May consider Xgeva monthly. Due to her unusual reaction to medications and recent dental issue, I have not started her Delton See yet.  Plan: -Return to clinic in 4 Weeks for folllow up and lab  -I refilled her xanax and ativan today  -CT CAP with iv contrast in 1-2 months, will coordinate with Dr. Melvyn Novas     All questions were answered. The patient knows to call the clinic with any problems, questions or concerns. I spent 25 minutes counseling the patient face to face. The total time spent in the appointment was 30 minutes and more than 50% was on counseling.     Truitt Merle, MD 05/04/2015

## 2015-05-07 ENCOUNTER — Ambulatory Visit (HOSPITAL_COMMUNITY): Payer: Medicaid Other | Admitting: Licensed Clinical Social Worker

## 2015-05-11 ENCOUNTER — Telehealth: Payer: Self-pay | Admitting: *Deleted

## 2015-05-11 NOTE — Telephone Encounter (Signed)
Received vm call transferred from triage from pt stating that she was having a panic attack & took xanax  7 am & Ativan @ 10:15 am & having a hard time calming down.  Returned call @ 12 45 am & did not receive answer but vm left for pt to return call & informed that we were checking in on her & would try to call back later.

## 2015-05-11 NOTE — Telephone Encounter (Signed)
Tried pt again & received vm.  Left message to call us back.

## 2015-05-11 NOTE — Telephone Encounter (Signed)
Pt did call back & states that she is feeling better @ 2:54 pm.

## 2015-05-12 ENCOUNTER — Telehealth: Payer: Self-pay | Admitting: *Deleted

## 2015-05-12 NOTE — Telephone Encounter (Signed)
Received vm call from pt stating that she is still having trouble with chest hurting with anxiety & panic attacks.  She reports taking 1 xanax this am.' Returned call & she is at her church to walk & she sounds very anxious.  Encouraged her to take ativan which she has with her & to talk with her pastor or someone at the church if possible.  She is concerned about an upcoming CT to be scheduled.  Informed that I didn't see that it had been scheduled yet but will review.  Emotional support given.

## 2015-05-14 ENCOUNTER — Other Ambulatory Visit: Payer: Self-pay | Admitting: *Deleted

## 2015-05-14 ENCOUNTER — Telehealth: Payer: Self-pay | Admitting: *Deleted

## 2015-05-14 MED ORDER — OXYCODONE HCL 5 MG PO TABS: 5.0000 mg | ORAL_TABLET | ORAL | Status: DC | PRN

## 2015-05-14 NOTE — Telephone Encounter (Signed)
Pt needs refill on oxycodone. Has prescription from 2/16 that "has expired" per date on bottle.  Please call when prescription is available. Reviewed procedure for pick up at [facility]

## 2015-05-14 NOTE — Telephone Encounter (Signed)
Notified pt to p/u script for oxycodone.  Pt had a script from Dr. Sondra Come but has never taken & she was concerned that it wasn't any good.  She is having some back pain at hs & thought she might take pain med.

## 2015-05-18 ENCOUNTER — Encounter: Payer: Self-pay | Admitting: Internal Medicine

## 2015-05-18 ENCOUNTER — Ambulatory Visit (INDEPENDENT_AMBULATORY_CARE_PROVIDER_SITE_OTHER): Payer: Medicaid Other | Admitting: Internal Medicine

## 2015-05-18 VITALS — BP 90/60 | HR 90 | Ht 63.0 in | Wt 106.4 lb

## 2015-05-18 DIAGNOSIS — R0600 Dyspnea, unspecified: Secondary | ICD-10-CM | POA: Diagnosis not present

## 2015-05-18 DIAGNOSIS — R918 Other nonspecific abnormal finding of lung field: Secondary | ICD-10-CM | POA: Diagnosis not present

## 2015-05-18 NOTE — Patient Instructions (Addendum)
If cough worsens add pepcid 20 mg at bedtime   If breathing gets worse and have to prop up further to sleep, you may need a pleurex catheter per IR (we could arrange this or let Dr Burr Medico refer you )

## 2015-05-18 NOTE — Progress Notes (Signed)
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 Deep surgical margin were positive, pT2N1, tumor size 3.7 x 3.0 x 2.2 cm, grade 3, 2 out of 10 lymph nodes were positive for tumor cells. Patient declined adjuvant chemotherapy, radiation and endocrin therapy    04/30/2014  Progression  She presented with worsening cough and dyspnea. CT on 05/01/2015 showed right hilar mass with compression of the SVC and right upper lobe pulmonary artery and obstruction of the right middle lobe bronchus. Moderate pericardial effusion    05/05/2014  Pathology Results  Endobronchial biopsy of right lower lobe showed poorly differentiated adenocarcinoma, estrogen receptor positive, CK AE1/AE3 positive. Negative for CK 7, CK 20, TTF-1, S100,etc. HER2(-). Pericardium biopsy negative.    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 CT abdomen and pelvis is negative for mets.    08/17/2014 - 09/18/2014  Anti-estrogen oral therapy  Letrozole 60m daily and palbociclib 1230mdaily 3 weeks on 1 week off, starting 08/18/2014, palbo was held for second cycle due to AEs (dizziness, fatigue and nose bleeding), Letrozole was held also due to AEs.    09/30/2014 - 10/19/2014  Anti-estrogen oral therapy  She  subsequently tried anastrozole and tamoxifen, had a severe side effects, such as dizziness, fatigue, mood swings, after the first few doses, could not tolerate and stopped.    12/09/2014  Imaging  CT chest abdomen and pelvis showed radiation changes in the right perihilar region was decreased in size of a right middle lobe mass, scatter right upper lobe pulmonary nodules, stable overall.    12/09/2014  Imaging  Bone scan showed stable mild increased uptake in the lateral left fifth and a right sixth ribs, stable overall.    02/18/2015 - 03/05/2015  Anti-estrogen oral therapy  She tried fulvestrant to 50 mg injection for 2 doses, could not tolerate and stopped.            04/16/2015  f/u ov/Patricia Carter re: recurrent sob  Chief Complaint  Patient presents with  . Follow-up    Pt c/o dry cough, some wheeze, SOB with exertion and CP/tightness with anxiety/nervousness. Pt states that she "doesn't feel right".   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 R pleural space Chief Complaint  Patient presents with  . Follow-up    Cough has improved. Her breathing is unchanged.   walking around track at church x 10 min at slow pace s stopping due to sob  Cough better to her satisfaction / no excess/ purulent sputum or mucus plugs   Sleeps at 30 degrees comfortably  Variable discomfort R chest post with cough but not deep breath.    No obvious day to day or daytime variabilty or assoc classically  ex   cp or  subjective wheeze overt sinus or hb symptoms. No unusual exp hx or h/o childhood pna/ asthma or knowledge  of premature birth.  Sleeping ok without nocturnal  or early am exacerbation  of respiratory  c/o's or need for noct saba. Also denies any obvious fluctuation of symptoms with weather or environmental changes or other aggravating or alleviating factors except as outlined above   Current Medications, Allergies, Complete Past Medical History, Past Surgical History, Family History, and Social History were reviewed in [employer] record.  ROS  The following are not active complaints unless bolded sore throat, dysphagia, dental problems, itching, sneezing,  nasal congestion or excess/ purulent secretions, ear ache,   fever, chills, sweats, unintended wt loss,  hemoptysis,  orthopnea pnd or leg swelling, presyncope, palpitations, heartburn, abdominal pain, anorexia, nausea, vomiting, diarrhea  or change in bowel or urinary habits, change in stools or urine, dysuria,hematuria,  rash, arthralgias, visual complaints, headache, numbness weakness or ataxia or problems with walking or coordination,  change in mood/affect or memory.            Objective:   Physical Exam  amb wf nad  But quite anxious    05/18/2015      106  04/16/15 110 lb (49.896 kg)  04/13/15 107 lb 4.8 oz (48.671 kg)  03/18/15 105 lb 11.2 oz (47.945 kg)    Vital signs reviewed     HEENT: nl dentition, turbinates, and orophanx. Nl external ear canals without cough reflex   NECK :  without JVD/Nodes/TM/ nl carotid upstrokes bilaterally   LUNGS: no acc muscle use,  Minimal decreased bs at right base  / no localized or gen wheezing or rhonchi or cough on insp or exp    CV:  RRR  no s3 or murmur or increase in P2, no edema   ABD:  soft and nontender with nl excursion in the supine position. No bruits or organomegaly, bowel sounds nl  MS:  warm without deformities, calf tenderness, cyanosis or clubbing  SKIN: warm and dry without lesions    NEURO:  alert, approp, no deficits       CXR PA and  Lateral:   04/16/2015 :    I personally reviewed images and agree with radiology impression as follows:   Slight interval improvement in the volume of the right pleural effusion. Persistent atelectasis or post radiation fibrotic change in the right perihilar region. There is no alveolar pneumonia nor CHF.     Assessment:

## 2015-05-19 ENCOUNTER — Encounter: Payer: Self-pay | Admitting: Internal Medicine

## 2015-05-19 NOTE — Assessment & Plan Note (Signed)
-   CT chest 04/30/2014 > R hilar mass, poss SVC/ mod pericardial effusion > admit.  - 05/05/14 RML > RLL airway involvement > endobronchial bx Pos breast ca   She also has a small right pleural effusion which is probably related to her malignancy  but at this point is not in need for any form of intervention. I would refer her to Dr. Cyndia Bent, who did her surgery, or IR for either as needed thoracentesis or a Pleurex catheter should this effusion increase in size over time but no dedicated pulmonary follow-up is planned at this point.Marland Kitchen

## 2015-05-19 NOTE — Assessment & Plan Note (Addendum)
-   04/29/2014  Walked RA x 3 laps @ 185 ft each stopped due to  End of study no desat and min sob  - 04/29/2014 spirometry abn f/v contour but min obstruction off saba x 12 h - Echo 01/19/15 - Left ventricle: The cavity size was normal. Wall thickness was normal. Systolic function was normal. The estimated ejection fraction was in the range of 55% to 60%. Wall motion was normal; there were no regional wall motion abnormalities. - 04/16/2015  Walked RA x 3 laps @ 185 ft each stopped due to  End of study, nl pace,  min sob / no desat  Nl pace    I had an extended final summary discussion with the patient reviewing all relevant studies completed to date and  lasting 15 to 20 minutes of a 25 minute visit    Overall she seems to be doing much better with good cough control and able walk 10 minutes on a track. I don't have any plans to follow her here given the fact that she will be followed serially and oncology with CT scans to decide whether and when to do additional therapy.  However, if there is a new symptom that is respiratory in nature that arises I will be happy to reevaluate her here.  Each maintenance medication was reviewed in detail including most importantly the difference between maintenance and prns and under what circumstances the prns are to be triggered using an action plan format that is not reflected in the computer generated alphabetically organized AVS.    Please see instructions for details which were reviewed in writing and the patient given a copy highlighting the part that I personally wrote and discussed at today's ov.

## 2015-05-23 ENCOUNTER — Encounter (HOSPITAL_COMMUNITY): Payer: Self-pay | Admitting: *Deleted

## 2015-05-23 ENCOUNTER — Emergency Department (HOSPITAL_COMMUNITY)
Admission: EM | Admit: 2015-05-23 | Discharge: 2015-05-23 | Disposition: A | Discharge status: Home / Self Care | Payer: Medicaid Other | Attending: Emergency Medicine | Admitting: Emergency Medicine

## 2015-05-23 ENCOUNTER — Emergency Department (HOSPITAL_COMMUNITY): Payer: Medicaid Other

## 2015-05-23 DIAGNOSIS — K2990 Gastroduodenitis, unspecified, without bleeding: Secondary | ICD-10-CM

## 2015-05-23 DIAGNOSIS — M545 Low back pain: Secondary | ICD-10-CM | POA: Insufficient documentation

## 2015-05-23 DIAGNOSIS — G8929 Other chronic pain: Secondary | ICD-10-CM

## 2015-05-23 DIAGNOSIS — R110 Nausea: Secondary | ICD-10-CM

## 2015-05-23 DIAGNOSIS — F419 Anxiety disorder, unspecified: Secondary | ICD-10-CM | POA: Diagnosis not present

## 2015-05-23 DIAGNOSIS — R1013 Epigastric pain: Secondary | ICD-10-CM

## 2015-05-23 DIAGNOSIS — C50912 Malignant neoplasm of unspecified site of left female breast: Secondary | ICD-10-CM | POA: Diagnosis not present

## 2015-05-23 DIAGNOSIS — M549 Dorsalgia, unspecified: Secondary | ICD-10-CM

## 2015-05-23 DIAGNOSIS — J90 Pleural effusion, not elsewhere classified: Secondary | ICD-10-CM

## 2015-05-23 DIAGNOSIS — C50919 Malignant neoplasm of unspecified site of unspecified female breast: Secondary | ICD-10-CM

## 2015-05-23 DIAGNOSIS — Z79899 Other long term (current) drug therapy: Secondary | ICD-10-CM | POA: Diagnosis not present

## 2015-05-23 LAB — URINALYSIS, ROUTINE W REFLEX MICROSCOPIC
BILIRUBIN URINE: NEGATIVE
GLUCOSE, UA: NEGATIVE mg/dL
HGB URINE DIPSTICK: NEGATIVE
Ketones, ur: 40 mg/dL — AB
Leukocytes, UA: NEGATIVE
Nitrite: NEGATIVE
Protein, ur: NEGATIVE mg/dL
SPECIFIC GRAVITY, URINE: 1.007 (ref 1.005–1.030)
pH: 7.5 (ref 5.0–8.0)

## 2015-05-23 LAB — COMPREHENSIVE METABOLIC PANEL
ALK PHOS: 72 U/L (ref 38–126)
ALT: 13 U/L — AB (ref 14–54)
AST: 24 U/L (ref 15–41)
Albumin: 4.3 g/dL (ref 3.5–5.0)
Anion gap: 11 (ref 5–15)
BUN: 14 mg/dL (ref 6–20)
CALCIUM: 9.8 mg/dL (ref 8.9–10.3)
CHLORIDE: 105 mmol/L (ref 101–111)
CO2: 27 mmol/L (ref 22–32)
CREATININE: 1.09 mg/dL — AB (ref 0.44–1.00)
GFR, EST NON AFRICAN AMERICAN: 54 mL/min — AB (ref >60)
Glucose, Bld: 81 mg/dL (ref 65–99)
Potassium: 4.1 mmol/L (ref 3.5–5.1)
SODIUM: 143 mmol/L (ref 135–145)
Total Bilirubin: 0.6 mg/dL (ref 0.3–1.2)
Total Protein: 6.3 g/dL — ABNORMAL LOW (ref 6.5–8.1)

## 2015-05-23 LAB — CBC WITH DIFFERENTIAL/PLATELET
Basophils Absolute: 0 10*3/uL (ref 0.0–0.1)
Basophils Relative: 0 %
EOS ABS: 0.1 10*3/uL (ref 0.0–0.7)
EOS PCT: 1 %
HCT: 39.5 % (ref 36.0–46.0)
Hemoglobin: 13.6 g/dL (ref 12.0–15.0)
LYMPHS ABS: 0.6 10*3/uL — AB (ref 0.7–4.0)
Lymphocytes Relative: 13 %
MCH: 28.9 pg (ref 26.0–34.0)
MCHC: 34.4 g/dL (ref 30.0–36.0)
MCV: 83.9 fL (ref 78.0–100.0)
MONOS PCT: 12 %
Monocytes Absolute: 0.6 10*3/uL (ref 0.1–1.0)
Neutro Abs: 3.6 10*3/uL (ref 1.7–7.7)
Neutrophils Relative %: 74 %
PLATELETS: 180 10*3/uL (ref 150–400)
RBC: 4.71 MIL/uL (ref 3.87–5.11)
RDW: 12.6 % (ref 11.5–15.5)
WBC: 4.8 10*3/uL (ref 4.0–10.5)

## 2015-05-23 LAB — I-STAT TROPONIN, ED: Troponin i, poc: 0 ng/mL (ref 0.00–0.08)

## 2015-05-23 LAB — I-STAT BETA HCG BLOOD, ED (MC, WL, AP ONLY): HCG, QUANTITATIVE: 5.2 m[IU]/mL — AB (ref <5)

## 2015-05-23 LAB — LIPASE, BLOOD: Lipase: 27 U/L (ref 11–51)

## 2015-05-23 MED ORDER — IOHEXOL 300 MG/ML  SOLN
50.0000 mL | Freq: Once | INTRAMUSCULAR | Status: AC | PRN
  Administered 2015-05-23: 50 mL via ORAL

## 2015-05-23 MED ORDER — ONDANSETRON HCL 4 MG/2ML IJ SOLN
4.0000 mg | Freq: Once | INTRAMUSCULAR | Status: AC
  Administered 2015-05-23: 4 mg via INTRAVENOUS
  Filled 2015-05-23: qty 2

## 2015-05-23 MED ORDER — IOHEXOL 300 MG/ML  SOLN
80.0000 mL | Freq: Once | INTRAMUSCULAR | Status: AC | PRN
  Administered 2015-05-23: 80 mL via INTRAVENOUS

## 2015-05-23 MED ORDER — POLYETHYLENE GLYCOL 3350 17 G PO PACK: 17.0000 g | PACK | Freq: Every day | ORAL | Status: DC

## 2015-05-23 MED ORDER — SODIUM CHLORIDE 0.9 % IV BOLUS (SEPSIS)
1000.0000 mL | Freq: Once | INTRAVENOUS | Status: AC
  Administered 2015-05-23: 1000 mL via INTRAVENOUS

## 2015-05-23 MED ORDER — ONDANSETRON 8 MG PO TBDP: 8.0000 mg | ORAL_TABLET | Freq: Three times a day (TID) | ORAL | Status: DC | PRN

## 2015-05-23 NOTE — Discharge Instructions (Signed)
Use miralax daily to help with constipation, if you have loose stools you can cut back to every other day or however you need to take it in order to continue having daily soft stools. Take omeprazole and pepcid as directed by Dr. Melvyn Novas, as these will help with your symptoms. Use zofran as needed for nausea. Stay well hydrated with plenty of water. Use your home pain medications as needed for pain, but don't drive while taking them. Use heat to the areas of pain as needed for additional relief. Follow up with Dr. Melvyn Novas and Dr. Burr Medico in the next 5-7 days for recheck of symptoms. Return to the ER for changes or worsening symptoms.   Abdominal Pain, Adult Many things can cause belly (abdominal) pain. Most times, the belly pain is not dangerous. Many cases of belly pain can be watched and treated at home. HOME CARE   Do not take medicines that help you go poop (laxatives) unless told to by your doctor.  Only take medicine as told by your doctor.  Eat or drink as told by your doctor. Your doctor will tell you if you should be on a special diet. GET HELP IF:  You do not know what is causing your belly pain.  You have belly pain while you are sick to your stomach (nauseous) or have runny poop (diarrhea).  You have pain while you pee or poop.  Your belly pain wakes you up at night.  You have belly pain that gets worse or better when you eat.  You have belly pain that gets worse when you eat fatty foods.  You have a fever. GET HELP RIGHT AWAY IF:   The pain does not go away within 2 hours.  You keep throwing up (vomiting).  The pain changes and is only in the right or left part of the belly.  You have bloody or tarry looking poop. MAKE SURE YOU:   Understand these instructions.  Will watch your condition.  Will get help right away if you are not doing well or get worse.   This information is not intended to replace advice given to you by your health care provider. Make sure you discuss  any questions you have with your health care provider.   Document Released: 10/04/2007 Document Revised: 05/08/2014 Document Reviewed: 12/25/2012 Elsevier Interactive Patient Education 2016 Elsevier Inc.  Back Pain, Adult Back pain is very common. The pain often gets better over time. The cause of back pain is usually not dangerous. Most people can learn to manage their back pain on their own.  HOME CARE  Watch your back pain for any changes. The following actions may help to lessen any pain you are feeling:  Stay active. Start with short walks on flat ground if you can. Try to walk farther each day.  Exercise regularly as told by your doctor. Exercise helps your back heal faster. It also helps avoid future injury by keeping your muscles strong and flexible.  Do not sit, drive, or stand in one place for more than 30 minutes.  Do not stay in bed. Resting more than 1-2 days can slow down your recovery.  Be careful when you bend or lift an object. Use good form when lifting:  Bend at your knees.  Keep the object close to your body.  Do not twist.  Sleep on a firm mattress. Lie on your side, and bend your knees. If you lie on your back, put a pillow under your knees.  Take medicines only as told by your doctor.  Put ice on the injured area.  Put ice in a plastic bag.  Place a towel between your skin and the bag.  Leave the ice on for 20 minutes, 2-3 times a day for the first 2-3 days. After that, you can switch between ice and heat packs.  Avoid feeling anxious or stressed. Find good ways to deal with stress, such as exercise.  Maintain a healthy weight. Extra weight puts stress on your back. GET HELP IF:   You have pain that does not go away with rest or medicine.  You have worsening pain that goes down into your legs or buttocks.  You have pain that does not get better in one week.  You have pain at night.  You lose weight.  You have a fever or chills. GET HELP  RIGHT AWAY IF:   You cannot control when you poop (bowel movement) or pee (urinate).  Your arms or legs feel weak.  Your arms or legs lose feeling (numbness).  You feel sick to your stomach (nauseous) or throw up (vomit).  You have belly (abdominal) pain.  You feel like you may pass out (faint).   This information is not intended to replace advice given to you by your health care provider. Make sure you discuss any questions you have with your health care provider.   Document Released: 10/04/2007 Document Revised: 05/08/2014 Document Reviewed: 08/19/2013 Elsevier Interactive Patient Education 2016 Elsevier Inc.  Gastritis, Adult Gastritis is soreness and puffiness (inflammation) of the lining of the stomach. If you do not get help, gastritis can cause bleeding and sores (ulcers) in the stomach. HOME CARE   Only take medicine as told by your doctor.  If you were given antibiotic medicines, take them as told. Finish the medicines even if you start to feel better.  Drink enough fluids to keep your pee (urine) clear or pale yellow.  Avoid foods and drinks that make your problems worse. Foods you may want to avoid include:  Caffeine or alcohol.  Chocolate.  Mint.  Garlic and onions.  Spicy foods.  Citrus fruits, including oranges, lemons, or limes.  Food containing tomatoes, including sauce, chili, salsa, and pizza.  Fried and fatty foods.  Eat small meals throughout the day instead of large meals. GET HELP RIGHT AWAY IF:   You have black or dark red poop (stools).  You throw up (vomit) blood. It may look like coffee grounds.  You cannot keep fluids down.  Your belly (abdominal) pain gets worse.  You have a fever.  You do not feel better after 1 week.  You have any other questions or concerns. MAKE SURE YOU:   Understand these instructions.  Will watch your condition.  Will get help right away if you are not doing well or get worse.   This  information is not intended to replace advice given to you by your health care provider. Make sure you discuss any questions you have with your health care provider.   Document Released: 10/04/2007 Document Revised: 07/10/2011 Document Reviewed: 05/31/2011 Elsevier Interactive Patient Education 2016 Elsevier Inc.  Duodenitis Duodenitis is inflammation of the lining of the first part of your small intestine (duodenum). There are two types of duodenitis:  Acute duodenitis (develops suddenly and is short lived).   Chronic duodenitis (develops over an extended period and lasts months to years). CAUSES  Duodenitis is most often caused by infection with the bacterium Helicobacter pylori (H.  pylori). H. pylori increases the production of stomach acid and causes changes in the environment of the duodenum. This irritates and damages the cells of the duodenum causing inflammation. Other causes of duodenitis include:   Long-term use of nonsteroidal anti-inflammatory drugs (NSAIDs). NSAIDs change the lining of the duodenum and make it more prone to injury from stomach acid.  Excessive use of alcohol. Alcohol increases stomach acid and changes the lining of the duodenum which makes it more likely for inflammation to develop.  Giardiasis. Giardiasis is a common infection of the small intestine. It can cause inflammation of the duodenum.   Other gastrointestinal disorders, such as Crohn disease. People with these disorders are more likely to develop duodenitis. SYMPTOMS  Although duodenitis does not always cause symptoms, symptoms that do occur include:  Nausea or vomiting.  Gassy, bloated feeling or an uncomfortable feeling of fullness after eating.  Burning, cramps, or pain in the upper abdominal area. DIAGNOSIS  To diagnose duodenitis, your health care provider may use results from:   An exam of the duodenum using a thin tube with a tiny camera on the tip, which is placed down your throat  (endoscope). The endoscope is passed through your stomach and into your duodenum. Sometimes a sample of tissue from your duodenum is removed with the endoscope. The sample is then examined under a microscope (biopsy) for signs of inflammation and H. pylori infection.   Tests that check samples of your blood or stool for H. pylori infection.   A test that checks the gases in a sample of your expired breath for H. pylori infection. The test measures the levels of carbon dioxide in your breath after you drink a special solution.  An X-ray exam using a special liquid that you swallow to illuminate your digestive tract (barium) to show signs of inflammation. TREATMENT  Treatment will depend on the cause of the duodenitis. The most common treatments include:  Use of medication to treat infection.  Medication to reduce stomach acid.  Discontinuing the use of NSAIDs.  Management of other gastrointestinal conditions.  Avoiding alcohol consumption. Additionally, taking the following steps can help to reduce the severity of your symptoms:  Drink enough water to keep your urine clear or pale yellow.  Avoid consuming these foods or drinks:  Caffeinated drinks.  Chocolate.  Peppermint or mint-flavored food or drinks.  Garlic.  Onions.  Spicy foods.  Citrus fruits, such as oranges, lemons, or limes.  Foods that use tomato-based sauces, such as pasta sauce, chili, salsa, and pizza.  Fatty foods.  Fried foods.   This information is not intended to replace advice given to you by your health care provider. Make sure you discuss any questions you have with your health care provider.   Document Released: 08/12/2012 Document Revised: 05/08/2014 Document Reviewed: 08/12/2012 Elsevier Interactive Patient Education 2016 Elsevier Inc.  Nausea, Adult Nausea means you feel sick to your stomach or need to throw up (vomit). It may be a sign of a more serious problem. If nausea gets worse, you  may throw up. If you throw up a lot, you may lose too much body fluid (dehydration). HOME CARE   Get plenty of rest.  Ask your doctor how to replace body fluid losses (rehydrate).  Eat small amounts of food. Sip liquids more often.  Take all medicines as told by your doctor. GET HELP RIGHT AWAY IF:  You have a fever.  You pass out (faint).  You keep throwing up or have  blood in your throw up.  You are very weak, have dry lips or a dry mouth, or you are very thirsty (dehydrated).  You have dark or bloody poop (stool).  You have very bad chest or belly (abdominal) pain.  You do not get better after 2 days, or you get worse.  You have a headache. MAKE SURE YOU:  Understand these instructions.  Will watch your condition.  Will get help right away if you are not doing well or get worse.   This information is not intended to replace advice given to you by your health care provider. Make sure you discuss any questions you have with your health care provider.   Document Released: 04/06/2011 Document Revised: 07/10/2011 Document Reviewed: 04/06/2011 Elsevier Interactive Patient Education 2016 [employer].  Pain Medicine Instructions HOW CAN PAIN MEDICINE AFFECT ME? You were prescribed pain medicine. This medicine may:  Make you tired or sleepy.  Affect how well you can:  Drive  Do certain activities. Pain medicine may not make all of your pain go away. You should be comfortable enough to:  Move.  Breathe.  Take care of yourself. HOW OFTEN SHOULD I TAKE PAIN MEDICINE AND HOW MUCH SHOULD I TAKE?  Take pain medicine only as told by your doctor and only as needed for pain.  You do not need to take pain medicine if you are not having pain, unless your doctor tells you to do that.  You can take less than the prescribed dose if you find that less medicine helps your pain. WHAT SHOULD I AVOID WHILE I AM TAKING PAIN MEDICINE? Follow these instructions after you start  taking pain medicine, while you are taking the medicine, and for 8 hours after you stop taking the medicine:  Do not drive.  Do not use machinery.  Do not use power tools.  Do not sign legal documents.  Do not drink alcohol.  Do not take sleeping pills.  Do not take care of children by yourself.  Do not do any activities that involve climbing or being in high places.  Do not go into any body of water unless there is an adult nearby who can watch and help you. This includes:  [place].  Rivers.  Oceans.  Spas.  Swimming pools. HOW CAN I KEEP OTHERS SAFE WHILE I AM TAKING PAIN MEDICINE?  Store your pain medicine as told by your doctor. Make sure that you keep it where children and pets cannot reach it.  Do not share your pain medicine with anyone.  Do not save any leftover pills. If you have any leftover pain medicine, get rid of it or destroy it as told by your doctor. WHAT ELSE DO I NEED TO KNOW ABOUT TAKING PAIN MEDICINE?  Use a poop (stool) softener if you have trouble pooping (constipation) because of your pain medicine. Eating more fruits and vegetables also helps with constipation.  Write down the times when you take your pain medicine. Look at the times before you take your next dose of medicine.  If your pain is very bad, do not take more pills than told by your doctor. Call your doctor for help.  Your pain medicine might have acetaminophen in it. Do not take any other acetaminophen while you are taking this medicine. An overdose of acetaminophen can do very bad damage to your liver. If you are taking any medicines in addition to your pain medicine, check the active ingredients on those medicines to see if acetaminophen  is listed. WHEN SHOULD I CALL MY DOCTOR?  Your medicine is not helping the pain.  You do either of these soon after you take the medicine:  Throw up (vomit).  Have watery poop (diarrhea).  You have new pain in areas that did not hurt  before.  You have an allergic reaction to your medicine. This may include:  Feeling itchy.  Swelling.  Feeling dizzy.  Getting a new rash. WHEN SHOULD I CALL 911 OR GO TO THE EMERGENCY ROOM?  You feel dizzy or you faint.  You feel very confused.  You throw up again and again.  Your skin or lips turn pale or bluish in color.  You are:  Short of breath.  Breathing much more slowly than usual.  You have a very bad allergic reaction to your medicine. This includes:  Developing a swollen tongue.  Having trouble breathing.   This information is not intended to replace advice given to you by your health care provider. Make sure you discuss any questions you have with your health care provider.   Document Released: 10/04/2007 Document Revised: 09/01/2014 Document Reviewed: 02/19/2014 Elsevier Interactive Patient Education [employer].

## 2015-05-23 NOTE — ED Provider Notes (Signed)
CSN: [id number]     Arrival date & time 05/23/15  1127 History   First MD Initiated Contact with Patient 05/23/15 1136     Chief Complaint  Patient presents with  . Abdominal Pain  . Back Pain     (Consider location/radiation/quality/duration/timing/severity/associated sxs/prior Treatment) HPI Comments: Patricia Carter is a 60 y.o. never-smoker female with a PMHx of GERD, anxiety, L breast cancer s/p L mastectomy 2010 (no adjuvant therapy, they recommended both but didn't complete it) with mets to lungs/thoracic lymph nodes/bilateral ribs found in 03/2014 s/p anti-estrogen treatment (stopped due to side effects) and radiation (06/2014) currently no longer undergoing treatment (supportive care per Dr. Burr Medico on 05/04/15), who presents to the ED with complaints of gradual onset epigastric abdominal pain 1 week. She states that the pain is currently resolved, but when it is present she describes as 5/10 intermittent epigastric aching pain that occasionally radiates into her lower back, worse with lying down, improved with heat and Ativan, and unrelieved with Aleve. Associated symptoms include nausea. She also states that her lower back pain occasionally occurs separately from the abdominal pain, so she is unsure if it is related or not. She is currently not having any of her symptoms at this time.   She saw her pulmonologist Dr.Wert on the 17th who started her on omeprazole, patient states she stopped taking it 2 days ago because it made her constipated, and since then has had resolution of her constipation. She doesn't think the omeprazole was helping. She does endorse semi-regular NSAID use.   She states that her oncologist Dr. Burr Medico wanted to get a repeat CT of her abdomen and pelvis several months ago but she never heard back. Per chart review, notes from the office visit on 05/04/15 mention having a repeat CT scan done in the next few months.  She denies any fevers, chills, chest pain, changes/worsening in  shortness of breath (has chronic SOB), cough, vomiting, diarrhea, ongoing constipation, obstipation, melena, hematochezia, dysuria, hematuria, increased urinary frequency, incontinence of urine or stool, perianal numbness or tingling, cauda equina symptoms, vaginal bleeding or discharge, numbness, tingling, weakness, myalgias, arthralgias, recent travel, sick contacts, suspicious food intake, alcohol use, recent antibiotics, or prior abdominal surgeries.   Patient is a 60 y.o. female presenting with abdominal pain and back pain. The history is provided by the patient and medical records. No language interpreter was used.  Abdominal Pain Pain location:  Epigastric Pain quality: aching   Pain radiates to:  Back (occasionally) Pain severity:  Moderate Onset quality:  Gradual Duration:  1 week Timing:  Intermittent Progression:  Resolved Chronicity:  New Context: not recent travel, not sick contacts and not suspicious food intake   Relieved by:  Heat (and ativan) Worsened by:  Position changes (laying down) Ineffective treatments:  NSAIDs Associated symptoms: nausea   Associated symptoms: no chest pain, no chills, no constipation, no cough, no diarrhea, no dysuria, no fever, no flatus, no hematemesis, no hematochezia, no hematuria, no melena, no shortness of breath, no vaginal bleeding, no vaginal discharge and no vomiting   Risk factors: NSAID use   Risk factors: no alcohol abuse and has not had multiple surgeries   Back Pain Associated symptoms: abdominal pain   Associated symptoms: no chest pain, no dysuria, no fever, no numbness and no weakness     Past Medical History  Diagnosis Date  . Breast cancer ([place])   . Breast cancer ([place])   . Lung mass    Past Surgical History  Procedure Laterality Date  . Mastectomy Left     2010  . Eye surgery      cataract surgery, wears contact left eye  . Subxyphoid pericardial window N/A 05/05/2014    Procedure: SUBXYPHOID PERICARDIAL WINDOW;   Surgeon: Gaye Pollack, MD;  Location: [facility] OR;  Service: Thoracic;  Laterality: N/A;  . Flexible bronchoscopy N/A 05/05/2014    Procedure: FLEXIBLE BRONCHOSCOPY;  Surgeon: Gaye Pollack, MD;  Location: [facility] OR;  Service: Thoracic;  Laterality: N/A;  . Video bronchoscopy with endobronchial ultrasound N/A 05/05/2014    Procedure: VIDEO BRONCHOSCOPY WITH ENDOBRONCHIAL ULTRASOUND;  Surgeon: Gaye Pollack, MD;  Location: [facility] OR;  Service: Thoracic;  Laterality: N/A;   Family History  Problem Relation Age of Onset  . Cancer Paternal Aunt     leukemia  . Cancer Father 17    prostate cancer    Social History  Substance Use Topics  . Smoking status: Never Smoker   . Smokeless tobacco: Never Used  . Alcohol Use: 0.0 oz/week    0 Standard drinks or equivalent per week     Comment: occ   OB History    No data available     Review of Systems  Constitutional: Negative for fever and chills.  Respiratory: Negative for cough and shortness of breath.   Cardiovascular: Negative for chest pain.  Gastrointestinal: Positive for nausea and abdominal pain. Negative for vomiting, diarrhea, constipation, blood in stool, melena, hematochezia, flatus and hematemesis.  Genitourinary: Negative for dysuria, frequency, hematuria, vaginal bleeding, vaginal discharge and difficulty urinating (no incontinence of urine/stool).  Musculoskeletal: Positive for back pain. Negative for myalgias and arthralgias.  Skin: Negative for color change.  Allergic/Immunologic: Negative for immunocompromised state.  Neurological: Negative for weakness and numbness.  Psychiatric/Behavioral: Negative for confusion.   10 Systems reviewed and are negative for acute change except as noted in the HPI.    Allergies  Iopamidol; Contrast media; Prednisone; and Zyrtec  Home Medications   Prior to Admission medications   Medication Sig Start Date End Date Taking? Authorizing Provider  ALPRAZolam Duanne Moron) 0.5 MG tablet Take 1 tablet (0.5  mg total) by mouth 3 (three) times daily as needed for anxiety. 05/04/15   Truitt Merle, MD  Cholecalciferol (VITAMIN D3) 5000 UNITS CAPS Take 1 capsule by mouth daily with supper.    Historical Provider, MD  famotidine (PEPCID) 20 MG tablet One at bedtime Patient not taking: Reported on 05/04/2015 04/16/15   Tanda Rockers, MD  LORazepam (ATIVAN) 1 MG tablet TAKE 1 TABLET EVERY 8 HOURS AS NEEDED PANIC ATTACKS 05/04/15   Truitt Merle, MD  naproxen sodium (ANAPROX) 220 MG tablet Take 220 mg by mouth 2 (two) times daily as needed (pain).    Historical Provider, MD  oxyCODONE (OXY IR/ROXICODONE) 5 MG immediate release tablet Take 1-2 tablets (5-10 mg total) by mouth every 4 (four) hours as needed for severe pain. 05/14/15   Truitt Merle, MD  pantoprazole (PROTONIX) 40 MG tablet Take 1 tablet (40 mg total) by mouth daily. Take 30-60 min before first meal of the day Patient not taking: Reported on 05/04/2015 04/16/15   Tanda Rockers, MD   BP 143/83 mmHg  Pulse 87  Temp(Src) 98.1 F (36.7 C) (Oral)  Resp 18  SpO2 100% Physical Exam  Constitutional: She is oriented to person, place, and time. Vital signs are normal. She appears well-developed.  Non-toxic appearance. No distress.  Afebrile, nontoxic, NAD, thin female who appears slightly older than  stated age  HENT:  Head: Normocephalic and atraumatic.  Mouth/Throat: Oropharynx is clear and moist and mucous membranes are normal.  Eyes: Conjunctivae and EOM are normal. Right eye exhibits no discharge. Left eye exhibits no discharge.  Neck: Normal range of motion. Neck supple.  Cardiovascular: Normal rate, regular rhythm, normal heart sounds and intact distal pulses.  Exam reveals no gallop and no friction rub.   No murmur heard. Pulmonary/Chest: Effort normal. No respiratory distress. She has decreased breath sounds in the right lower field. She has no wheezes. She has no rhonchi. She has no rales.  Slightly diminished breath sounds in RLL  Abdominal: Soft. Normal  appearance and bowel sounds are normal. She exhibits no distension. There is tenderness in the epigastric area. There is no rigidity, no rebound, no guarding, no CVA tenderness, no tenderness at McBurney's point and negative Murphy's sign.    Soft, nondistended, +BS throughout, with very mild epigastric TTP, no r/g/r, neg murphy's, neg mcburney's, no CVA TTP   Musculoskeletal: Normal range of motion.       Cervical back: Normal.       Thoracic back: Normal.       Lumbar back: She exhibits normal range of motion, no tenderness, no bony tenderness, no deformity and no spasm.  Lumbar spine with FROM intact without spinous process TTP, no bony stepoffs or deformities, no paraspinous muscle TTP or muscle spasms. Strength 5/5 in all extremities, sensation grossly intact in all extremities, gait steady and nonantalgic. No overlying skin changes.   Neurological: She is alert and oriented to person, place, and time. She has normal strength. No sensory deficit.  Skin: Skin is warm, dry and intact. No rash noted.  Psychiatric: She has a normal mood and affect.  Nursing note and vitals reviewed.   ED Course  Procedures (including critical care time) Labs Review Labs Reviewed  CBC WITH DIFFERENTIAL/PLATELET - Abnormal; Notable for the following:    Lymphs Abs 0.6 (*)    All other components within normal limits  COMPREHENSIVE METABOLIC PANEL - Abnormal; Notable for the following:    Creatinine, Ser 1.09 (*)    Total Protein 6.3 (*)    ALT 13 (*)    GFR calc non Af Amer 54 (*)    All other components within normal limits  URINALYSIS, ROUTINE W REFLEX MICROSCOPIC (NOT AT [facility]) - Abnormal; Notable for the following:    Ketones, ur 40 (*)    All other components within normal limits  I-STAT BETA HCG BLOOD, ED ([facility], [facility], [facility] ONLY) - Abnormal; Notable for the following:    I-stat hCG, quantitative 5.2 (*)    All other components within normal limits  LIPASE, BLOOD  I-STAT TROPOININ, ED    Imaging  Review Ct Abdomen Pelvis W Contrast  05/23/2015  CLINICAL DATA:  Abdominal and back pain. Moderate nausea. Status post treated left breast cancer. EXAM: CT ABDOMEN AND PELVIS WITH CONTRAST TECHNIQUE: Multidetector CT imaging of the abdomen and pelvis was performed using the standard protocol following bolus administration of intravenous contrast. CONTRAST:  27m OMNIPAQUE IOHEXOL 300 MG/ML SOLN, 544mOMNIPAQUE IOHEXOL 300 MG/ML SOLN COMPARISON:  12/09/2014, 08/14/2014 CT of the abdomen pelvis. FINDINGS: Lower chest: There is moderate in size right pleural effusion. Linear peribronchial opacities are seen in the anterior portion of the visualized right lung, mostly right middle lobe. Anterior subpleural thickening is noted in the same portion of the lung. Bilateral subpectoral saline implants are in place. There has been a prior  left mastectomy. The heart is normal in size. There is no pericardial effusion. Hepatobiliary: No masses or other significant abnormality. The gallbladder is normal. Pancreas: No mass, inflammatory changes, or other significant abnormality. Spleen: Within normal limits in size and appearance. Adrenals/Urinary Tract: No masses identified. No evidence of hydronephrosis. Stomach/Bowel: No evidence of obstruction, or abnormal fluid collections. There is persistent thickening of the duodenum. Vascular/Lymphatic: No pathologically enlarged lymph nodes. No evidence of abdominal aortic aneurysm. Mild atherosclerotic disease of the aorta. Reproductive: No mass or other significant abnormality. Other: None. Musculoskeletal:  No suspicious bone lesions identified. IMPRESSION: Moderate in size right pleural effusion. Peribronchovascular airspace disease in the right middle lobe, partially visualized, with associated anterior right subpleural linear thickening. Differential diagnosis includes pneumonia, postobstructive atelectasis or lymphangitic spread or metastatic disease. Radiation field changes are  felt unlikely. No CT evidence of acute abnormalities within the abdomen or pelvis. Persistent thickening of the duodenum. Nonspecific finding. This may represent a duodenitis. Correlation to upper endoscopy may be considered. Electronically Signed   By: Fidela Salisbury M.D.   On: 05/23/2015 15:06   I have personally reviewed and evaluated these images and lab results as part of my medical decision-making.   EKG Interpretation   Date/Time:  Sunday May 23 2015 12:03:25 EST Ventricular Rate:  78 PR Interval:  135 QRS Duration: 92 QT Interval:  416 QTC Calculation: 474 R Axis:   79 Text Interpretation:  Sinus rhythm no acute ST/T changes no significant  change since Sept 2016 Confirmed by Regenia Skeeter  MD, SCOTT ([id number]) on  05/23/2015 12:07:15 PM      MDM   Final diagnoses:  Epigastric abdominal pain  Nausea  Metastatic breast cancer (H[facility])  Gastritis and duodenitis  Chronic back pain  Pleural effusion on right    60 y.o. female here with 1wk of intermittent nausea and epigastric abd pain that occasionally radiates into her lower back. On exam, minimal tenderness in epigastrum, neg murphy's sign, no CVA tenderness, nonperitoneal. No midline spinal tenderness, no paraspinous muscle tenderness, no red flag s/sx of cord compression. Will get labs, EKG, trop, and U/A. Given hx of metastatic breast cancer, will obtain CT abd/pelvis (pt states she was supposed to get one several months ago but didn't hear back from her oncologist so she never had it done). Pt has "allergy" to contrast but states she does fine at [facility] with contrast, states she takes ativan first and does well. Took ativan this AM.  No ongoing pain right now, denies wanting anything for pain. Denied wanting anything for nausea, but will give zofran just in case, in order to avoid nausea returning. She denies wanting GI cocktail or PPI because she was on omeprazole and developed constipation. Will give fluids and reassess shortly.    3:30 PM U/A neg. HCG 5.2, unlikely to be accurate for pregnancy, especially since none seen on CT today. Trop neg, EKG unchanged from prior and without concerning changes. CBC WNL, CMP WNL (Cr 1.09 but near baseline). Lipase WNL. CT showing moderate R pleural effusion (similar when compared to prior studies) with some peribronchovascular disease in the RML which could be from mets vs lymphagitic spread vs postobstructive atelectasis vs PNA. No leukocytosis and pt without cough, doubt infectious etiology, likely from her malignancy.  Abd/pelvis without acute abnormalities, aside from persistent thickening of duodenum which could be cause of her symptoms. Given that pt is on supportive care as outpt, has known R lung met with mets to ribs, etc, this is  likely the cause of pain but doubt need for admission, especially since pt declined wanting anything for pain here. she has oxycodone at home, discussed that this is likely the source of constipation, take miralax daily to help with this. She is on an H2 blocker, and states she will start back on the PPI, so nothing really further needed for duodenitis/GERD symptoms. Use heat as needed for pain, and home pain meds as needed. Dr. Regenia Skeeter saw pt and agrees with plan. Will send home with zofran and miralax. F/up with Dr. Melvyn Novas and Dr. Burr Medico this week. Tolerating PO well here. I explained the diagnosis and have given explicit precautions to return to the ER including for any other new or worsening symptoms. The patient understands and accepts the medical plan as it's been dictated and I have answered their questions. Discharge instructions concerning home care and prescriptions have been given. The patient is STABLE and is discharged to home in good condition.  BP 122/75 mmHg  Pulse 81  Temp(Src) 98.1 F (36.7 C) (Oral)  Resp 11  SpO2 100%  Meds ordered this encounter  Medications  . ondansetron (ZOFRAN) injection 4 mg    Sig:   . sodium chloride 0.9 %  bolus 1,000 mL    Sig:   . iohexol (OMNIPAQUE) 300 MG/ML solution 50 mL    Sig:   . iohexol (OMNIPAQUE) 300 MG/ML solution 80 mL    Sig:   . polyethylene glycol (MIRALAX / GLYCOLAX) packet    Sig: Take 17 g by mouth daily.    Dispense:  30 each    Refill:  1    Order Specific Question:  Supervising Provider    Answer:  Sabra Heck, [place]  . ondansetron (ZOFRAN ODT) 8 MG disintegrating tablet    Sig: Take 1 tablet (8 mg total) by mouth every 8 (eight) hours as needed for nausea or vomiting.    Dispense:  10 tablet    Refill:  0    Order Specific Question:  Supervising Provider    Answer:  Noemi Chapel [[id number]]     Shevelle Smither Camprubi-Soms, PA-C 05/23/15 [place], MD 05/25/15 [phone]

## 2015-05-23 NOTE — ED Notes (Signed)
Per EMS report: pt is coming from home and presents to the ED today with intermediate abd and back pain.  Pt has moderate amount of nausea.  Pt is a breast CA pt and 2 months ago was the last time pt had any sort of cancer tx.  Pt hx anxiety in which she has anti-anxiety medications for.  Pt last took an Ativan at 05:30 today.  Pt a/o x 4.

## 2015-05-23 NOTE — ED Notes (Signed)
Pt at CT

## 2015-05-23 NOTE — ED Notes (Signed)
2 unsuccessful IV attempts by this RN.  Charge RN to attempt. 

## 2015-05-23 NOTE — ED Notes (Signed)
Bed: WA07 Expected date:  Expected time:  Means of arrival:  Comments: EMS- 60 yo abd pain

## 2015-05-28 ENCOUNTER — Telehealth: Payer: Self-pay | Admitting: *Deleted

## 2015-05-28 NOTE — Telephone Encounter (Signed)
Pt called requesting a call back from nurse.  Spoke with pt and was informed re: 1.   Pt would like to request records to be sent to  Dr. Raquel James  At  [facility] - for 3rd opinion.    Gave pt phone number to HIM dept to request records. 2.   Pt went to ER on Sunday 05/23/15 for back pain and abdominal cramping.  Pt stated she was told that she has duodenitis. Pt was instructed by ER physician to continue with Omeprazole as prescribed       By Dr. Feng,    Pt stated she saw her PCP Dr. Andrew  Meier @ [facility], and was given Carafate - which pt stated helping her symptoms somewhat. 3.   Pt was told by PCP that Dr. Feng would refer pt to a gastroenterologist.  Informed pt that no notes found indicating the referral.   Instructed pt to contact her PCP to inquire about GI referral.       Pt voiced that she " would feel better if Dr. Feng would refer me to a GI doctor ". 4.   Dr.  Feng  Notified.  Pt\'s   Phone     [phone].

## 2015-05-31 ENCOUNTER — Other Ambulatory Visit: Payer: Self-pay | Admitting: Hematology

## 2015-06-01 ENCOUNTER — Encounter: Payer: Self-pay | Admitting: Hematology

## 2015-06-01 ENCOUNTER — Ambulatory Visit (HOSPITAL_BASED_OUTPATIENT_CLINIC_OR_DEPARTMENT_OTHER): Payer: Medicaid Other | Admitting: Hematology

## 2015-06-01 ENCOUNTER — Other Ambulatory Visit (HOSPITAL_BASED_OUTPATIENT_CLINIC_OR_DEPARTMENT_OTHER): Payer: Medicaid Other

## 2015-06-01 ENCOUNTER — Telehealth: Payer: Self-pay | Admitting: Hematology

## 2015-06-01 VITALS — BP 117/96 | HR 102 | Temp 97.6°F | Resp 18 | Ht 63.0 in | Wt 105.3 lb

## 2015-06-01 DIAGNOSIS — R1013 Epigastric pain: Secondary | ICD-10-CM | POA: Diagnosis not present

## 2015-06-01 DIAGNOSIS — C7800 Secondary malignant neoplasm of unspecified lung: Secondary | ICD-10-CM | POA: Diagnosis not present

## 2015-06-01 DIAGNOSIS — C50919 Malignant neoplasm of unspecified site of unspecified female breast: Secondary | ICD-10-CM | POA: Diagnosis not present

## 2015-06-01 DIAGNOSIS — C7951 Secondary malignant neoplasm of bone: Secondary | ICD-10-CM

## 2015-06-01 DIAGNOSIS — F4323 Adjustment disorder with mixed anxiety and depressed mood: Secondary | ICD-10-CM

## 2015-06-01 DIAGNOSIS — F410 Panic disorder [episodic paroxysmal anxiety] without agoraphobia: Secondary | ICD-10-CM | POA: Diagnosis not present

## 2015-06-01 DIAGNOSIS — F329 Major depressive disorder, single episode, unspecified: Secondary | ICD-10-CM | POA: Diagnosis not present

## 2015-06-01 DIAGNOSIS — R530 Neoplastic (malignant) related fatigue: Secondary | ICD-10-CM

## 2015-06-01 LAB — CBC WITH DIFFERENTIAL/PLATELET
BASO%: 0.2 % (ref 0.0–2.0)
BASOS ABS: 0 10*3/uL (ref 0.0–0.1)
EOS%: 1.2 % (ref 0.0–7.0)
Eosinophils Absolute: 0.1 10*3/uL (ref 0.0–0.5)
HCT: 42.2 % (ref 34.8–46.6)
HGB: 14.4 g/dL (ref 11.6–15.9)
LYMPH%: 11.4 % — AB (ref 14.0–49.7)
MCH: 28.7 pg (ref 25.1–34.0)
MCHC: 34.1 g/dL (ref 31.5–36.0)
MCV: 84.2 fL (ref 79.5–101.0)
MONO#: 0.8 10*3/uL (ref 0.1–0.9)
MONO%: 16 % — AB (ref 0.0–14.0)
NEUT#: 3.4 10*3/uL (ref 1.5–6.5)
NEUT%: 71.2 % (ref 38.4–76.8)
Platelets: 244 10*3/uL (ref 145–400)
RBC: 5.01 10*6/uL (ref 3.70–5.45)
RDW: 12.9 % (ref 11.2–14.5)
WBC: 4.8 10*3/uL (ref 3.9–10.3)
lymph#: 0.6 10*3/uL — ABNORMAL LOW (ref 0.9–3.3)

## 2015-06-01 MED ORDER — ALPRAZOLAM 0.5 MG PO TABS: 0.5000 mg | ORAL_TABLET | Freq: Three times a day (TID) | ORAL | Status: DC | PRN

## 2015-06-01 MED ORDER — LORAZEPAM 1 MG PO TABS: 1.0000 mg | ORAL_TABLET | Freq: Three times a day (TID) | ORAL | Status: DC | PRN

## 2015-06-01 NOTE — Progress Notes (Signed)
[place]  Telephone:([phone]) [phone] [phone]) [phone]  Clinic Follow Up Note   Patient Care Team: Gearlean Alf, PA-C as PCP - General (Physician Assistant) [facility] Tanda Rockers, MD as Consulting Physician (Pulmonary Disease) 06/01/2015  CHIEF COMPLAINTS:  Recurrent breast cancer    Breast cancer metastasized to lung [name]   12/23/2009 Surgery left mastectomy with axillary node dissection. Deep surgical margin were positive, pT2N1, tumor size 3.7 x 3.0 x 2.2 cm, grade 3, 2 out of 10 lymph nodes were positive for tumor cells. Patient declined adjuvant chemotherapy, radiation and endocrin therapy   04/30/2014 Progression She presented with worsening cough and dyspnea. CT on 05/01/2015 showed right hilar mass with compression of the SVC and right upper lobe pulmonary artery and obstruction of the right middle lobe bronchus. Moderate pericardial effusion   05/05/2014 Pathology Results Endobronchial biopsy of right lower lobe showed poorly differentiated adenocarcinoma, estrogen receptor positive, CK AE1/AE3 positive. Negative for CK 7, CK 20, TTF-1, S100,etc.  HER2(-). Pericardium biopsy negative.   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 CT abdomen and pelvis is negative for mets.    08/17/2014 - 09/18/2014 Anti-estrogen oral therapy Letrozole 34m daily and palbociclib 1261mdaily 3 weeks on 1 week off, starting 08/18/2014, palbo was held for second cycle due to AEs (dizziness, fatigue and nose bleeding), Letrozole was held also due to AEs.    09/30/2014 - 10/19/2014 Anti-estrogen oral therapy She subsequently tried anastrozole and tamoxifen, had a severe side effects, such as dizziness, fatigue, mood swings, after the first few  doses, could not tolerate and stopped.   12/09/2014 Imaging CT chest abdomen and pelvis showed radiation changes in the right perihilar region was decreased in size of a right middle lobe mass, scatter right upper lobe pulmonary nodules, stable overall.   12/09/2014 Imaging Bone scan showed stable mild increased uptake in the lateral left fifth and a right sixth ribs, stable overall.   02/18/2015 - 03/05/2015 Anti-estrogen oral therapy She tried fulvestrant to 50 mg injection for 2 doses, could not tolerate and stopped.      HISTORY OF PRESENTING ILLNESS (07/14/2014):  Patricia Haslem60.o. female is here because of recently diagnosed metastatic recurrent breast cancer.  She was diagnosed with pT2 N1M0, stage IIB, left breast cancer in August 2011. She [facility] surgery, and declined adjuvant chemotherapy and endocrine therapy. She has not followed with physician regularly after that and does not have annual mammogram.  She started having dry cough and dyspnea on exertion in May 2016. She had multiple ED visit since then and was treated with multiple courses of antibiotics for bronchitis, which really did not help. In December 2015, she called the bowel pulmonary clinic and was evaluated for worsening cough and dyspnea. A CT of chest was obtained which showed a right hilar mass which cause compression on SVC and right upper lobe pulmonary artery and obstruction of right middle lobe. Moderate pericardial effusion was noticed. She underwent bronchoscopy and pericardial window on 05/01/2014. Biopsy of the right endobronchial lesion showed poorly differentiated adenoma, consistent with breast primary. Pericardial window biopsy and cytology was negative  for malignancy.  She started palliative radiation on 06/10/2014, and completed on 06/29/2014, a total of 14  sessions. She tolerated well initially, but developed severe substernal chest pain and dysphagia at the last week of radiation. She still has quite a bit chest pain, is taking Bumex and oxycodone, which helps only some. She is able to eat soft food diet and adequate. She lost about 7 pounds during the radiation. Her dyspnea and cough has significantly improved after radiation.  CURRENT TREATMENT: Supportive care  INTERIM HISTORY: She returns for follow up. She has had epigastric pain for the past few weeks, her PCP saw her and ordered Carafate, which has been helping. She went ot ED on 1/22 for the worsening epigastric pain, CT abdomen and pelvis was done which showed possible duodenitis. Her primary care physician has referred to [facility] in [place]. She still very anxious, has frequent panic attacks. She takes senna And Ativan 3 times a day. She called 911 last night, but didn't go to emergency room. She states "I just want somebody to stay with me, I few safer that awake, I'm scared sometime being alone". She saw her psychiatrist twice, didn't feel very helpful from the second visit, and has not scheduled her follow-up appointment yet. She is scheduled to see Dr. Raquel James [facility], who practice [facility]   MEDICAL HISTORY:  Past Medical History  Diagnosis Date  . Breast cancer ([place])   . Breast cancer ([place])   . Lung mass     SURGICAL HISTORY: Past Surgical History  Procedure Laterality Date  . Mastectomy Left     2010  . Eye surgery      cataract surgery, wears contact left eye  . Subxyphoid pericardial window N/A 05/05/2014    Procedure: SUBXYPHOID PERICARDIAL WINDOW;  Surgeon: Gaye Pollack, MD;  Location: [facility] OR;  Service: Thoracic;  Laterality: N/A;  . Flexible bronchoscopy N/A 05/05/2014    Procedure: FLEXIBLE BRONCHOSCOPY;  Surgeon: Gaye Pollack, MD;  Location: [place];  Service: Thoracic;  Laterality: N/A;  . Video bronchoscopy with  endobronchial ultrasound N/A 05/05/2014    Procedure: VIDEO BRONCHOSCOPY WITH ENDOBRONCHIAL ULTRASOUND;  Surgeon: Gaye Pollack, MD;  Location: [facility] OR;  Service: Thoracic;  Laterality: N/A;    SOCIAL HISTORY: Social History   Social History  . Marital Status:  divorced     Spouse Name: N/A  . Number of Children:  she has one daughter who lives in [place]   . Years of Education: N/A   Occupational History  . Works PT reception    Social History Main Topics  . Smoking status: Never Smoker   . Smokeless tobacco: Never Used  . Alcohol Use: 0.0 oz/week    0 Standard drinks or equivalent per week     Comment: occ  . Drug Use: No  . Sexual Activity: Not Currently   Other Topics Concern  . Not on file   Social History Narrative    FAMILY HISTORY: Family History  Problem Relation Age of Onset  . Cancer Paternal Aunt     leukemia  . Cancer Father 48    prostate cancer     ALLERGIES:  is allergic to iopamidol; omeprazole; prednisone; and zyrtec.  MEDICATIONS:  Current Outpatient Prescriptions  Medication Sig Dispense Refill  . ALPRAZolam (XANAX) 0.5 MG tablet Take 1 tablet (0.5 mg total) by mouth 3 (three) times daily as needed for anxiety. 80 tablet 0  . Cholecalciferol (  VITAMIN D3) 5000 UNITS CAPS Take 5,000 Units by mouth daily with supper.     Marland Kitchen LORazepam (ATIVAN) 1 MG tablet Take 1 tablet (1 mg total) by mouth every 8 (eight) hours as needed (panic attacks anxiety related). 45 tablet 0  . naproxen sodium (ANAPROX) 220 MG tablet Take 220 mg by mouth 2 (two) times daily as needed (pain).    . ondansetron (ZOFRAN ODT) 8 MG disintegrating tablet Take 1 tablet (8 mg total) by mouth every 8 (eight) hours as needed for nausea or vomiting. 10 tablet 0  . polyethylene glycol (MIRALAX / GLYCOLAX) packet Take 17 g by mouth daily. 30 each 1  . sucralfate (CARAFATE) 1 GM/10ML suspension Take 10 mg by mouth 4 (four) times daily.    . famotidine (PEPCID) 20 MG tablet One at bedtime  (Patient not taking: Reported on 05/04/2015) 30 tablet 2  . oxyCODONE (OXY IR/ROXICODONE) 5 MG immediate release tablet Take 1-2 tablets (5-10 mg total) by mouth every 4 (four) hours as needed for severe pain. (Patient not taking: Reported on 05/23/2015) 30 tablet 0  . pantoprazole (PROTONIX) 40 MG tablet Take 1 tablet (40 mg total) by mouth daily. Take 30-60 min before first meal of the day (Patient not taking: Reported on 05/04/2015) 30 tablet 2   No current facility-administered medications for this visit.    REVIEW OF SYSTEMS:   Constitutional: Denies fevers, chills or abnormal night sweats. (+) fatigue  Eyes: Denies blurriness of vision, double vision or watery eyes Ears, nose, mouth, throat, and face: Denies mucositis or sore throat Respiratory:cough and dyspnea has significantly improved after radiation, no wheezes Cardiovascular: (+) mid chest pain, Denies palpitation, or lower extremity swelling Gastrointestinal:  Denies nausea, heartburn or change in bowel habits Skin: Denies abnormal skin rashes Lymphatics: Denies new lymphadenopathy or easy bruising Neurological:Denies numbness, tingling or new weaknesses Behavioral/Psych: Mood is stable, no new changes  All other systems were reviewed with the patient and are negative.  PHYSICAL EXAMINATION: ECOG PERFORMANCE STATUS: 2  Filed Vitals:   06/01/15 0924  BP: 117/96  Pulse: 102  Temp: 97.6 F (36.4 C)  Resp: 18   Filed Weights   06/01/15 0924  Weight: 105 lb 4.8 oz (47.764 kg)   GENERAL:alert,  breathing comfortably, no tachypnea SKIN: skin color, texture, turgor are normal, no rashes or significant lesions EYES: normal, conjunctiva are pink and non-injected, sclera clear OROPHARYNX:no exudate, no erythema and lips, buccal mucosa, and tongue normal  NECK: supple, thyroid normal size, non-tender, without nodularity LYMPH:  no palpable lymphadenopathy in the cervical, axillary or inguinal LUNGS: clear to auscultation and  percussion with normal breathing effort on left, diminished breath sounds in the right lung base up to mid chest. HEART: regular rate & rhythm and no murmurs and no lower extremity edema ABDOMEN:abdomen soft, non-tender and normal bowel sounds Musculoskeletal:no cyanosis of digits and no clubbing  PSYCH: alert & oriented x 3 with fluent speech NEURO: no focal motor/sensory deficits  LABORATORY DATA:  I have reviewed the data as listed CBC Latest Ref Rng 06/01/2015 05/23/2015 03/05/2015  WBC 3.9 - 10.3 10e3/uL 4.8 4.8 4.6  Hemoglobin 11.6 - 15.9 g/dL 14.4 13.6 13.6  Hematocrit 34.8 - 46.6 % 42.2 39.5 39.8  Platelets 145 - 400 10e3/uL 244 180 232    CMP Latest Ref Rng 05/23/2015 05/04/2015 03/05/2015  Glucose 65 - 99 mg/dL 81 73 92  BUN 6 - 20 mg/dL 14 11.2 16.3  Creatinine 0.44 - 1.00 mg/dL 1.09(H) 1.0  1.0  Sodium 135 - 145 mmol/L 143 141 143  Potassium 3.5 - 5.1 mmol/L 4.1 3.9 4.2  Chloride 101 - 111 mmol/L 105 - -  CO2 22 - 32 mmol/L 27 30(H) 28  Calcium 8.9 - 10.3 mg/dL 9.8 9.7 10.1  Total Protein 6.5 - 8.1 g/dL 6.3(L) 7.3 7.1  Total Bilirubin 0.3 - 1.2 mg/dL 0.6 <0.30 0.35  Alkaline Phos 38 - 126 U/L 72 87 85  AST 15 - 41 U/L 24 22 23   ALT 14 - 54 U/L 13(L) 16 14    PATHOLOGY REPORT: Diagnosis 05/05/2014 1. Pericardium, biopsy - BENIGN MESOTHELIAL LINED FIBROADIPOSE TISSUE. 2. Endobronchial biopsy, right middle lobe - POORLY DIFFERENTIATED CARCINOMA - SEE COMMENT. 3. Endobronchial biopsy, right lower lobe - POORLY DIFFERENTIATED CARCINOMA. - SEE COMMENT. 1 of 2 FINAL for Patricia Carter, Patricia Carter ([id number]) Microscopic Comment 2. , 3. The malignant cells are positive for cytokeratin AE1/AE3 and estrogen receptor. They are negative for cytokeratin 7, cytokeratin 20, S-100, GCDFP, CD45, chromogranin, cytokeratin 5/6, synaptophysin, TTF-1. This immunohistochemical profile is not specific. However, given the patient's history, the positive staining for estrogen receptor is most suggestive  of metastatic primary breast carcinoma. A HER-2 will be attempted and the results reportedly separately. (JBK:ecj 05/11/2014)  Results: HER-2/NEU BY CISH - NO AMPLIFICATION OF HER-2 DETECTED. RESULT RATIO OF HER2: CEP 17 SIGNALS 0.94 AVERAGE HER2 COPY NUMBER PER CELL 1.50  RADIOGRAPHIC STUDIES: I have personally reviewed the radiological images as listed and agreed with the findings in the report.  CT abdomen and pelvis 05/23/2015 IMPRESSION: Moderate in size right pleural effusion.  Peribronchovascular airspace disease in the right middle lobe, partially visualized, with associated anterior right subpleural linear thickening. Differential diagnosis includes pneumonia, postobstructive atelectasis or lymphangitic spread or metastatic disease. Radiation field changes are felt unlikely.  No CT evidence of acute abnormalities within the abdomen or pelvis.  Persistent thickening of the duodenum. Nonspecific finding. This may represent a duodenitis. Correlation to upper endoscopy may be Considered.  Echo 01/19/2015 Study Conclusions  - Left ventricle: The cavity size was normal. Wall thickness was normal. Systolic function was normal. The estimated ejection fraction was in the range of 55% to 60%. Wall motion was normal; there were no regional wall motion abnormalities. -Pericardium: There was no pericardial effusion.  ASSESSMENT & PLAN:  60 year old female with past medical history of stage IIB left breast cancer, status post mastectomy and axillary lymph node dissection in 2011. Now presented with metastatic disease to lungs and lymph nodes.  1. 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 She never smoked, and immunostain does not support primary lung cancer. -We discussed the  natural history of her cancer, unfortunately this is incurable at this stage, but treatable and we do have many treatment options.  -She has had good response to palliative radiation with significant symptom improvement. -PET scan was denied by her insurance. Bone scan showed a possible left lateral rib metastasis. CT of the abdomen was negative for other metastasis. -She has tried letrozole, anastrozole, tamoxifen and fulvestrant, but could not tolerate.  -We previously discussed the other treatment option, which would be chemotherapy. Giving her intolerance to antiestrogen therapy, I think her tolerance to chemotherapy would be quite low. She is not very symptomatic, I recommend to continue observation for now. -I reviewed her recent CT abdomen and pelvis scan  which was done in the emergency room, showed right pleural effusion, no other evidence of disease. -she has been clinically stable, lab reviewed which are normal, will continue observation -I will repeat her CT chest in one month to follow up her pleural effusion   2. Right pleural effusion -She has intermittent dyspnea at rest and exertion, some are related to her anxiety, some are probably related to her moderate size right pleural effusion. -I reviewed her recent CT abdomen scan, and compared to her previous CT from September 2016, the right pleural effusion has slightly increased, it is moderate, she is not very symptomatic. 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 -We discussed the option of right thoracentesis versus observation for now, and we agreed to a observe it for now. -Repeat a CT chest without contrast in 4 weeks.  3. Epigastric pain, possible duodenitis  -She is on Carafate, which helps. -She has been referred to [facility] in [place], she is waiting for appointment.   4. Depression, anxiety and frequent panic attack -She will continue taking xanax and  Ativan -I encourage her to continue follow-up with her psychiatrist Ms Elisabeth Cara   5. 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 -She has a mild dyspnea on exertion, overall her symptoms has significant improved after radiation. -Restaging CT scan in March 2016 showed significant improvement of her lung lesion, right hilar and mediastinal adenopathy. -follow up with Dr. Melvyn Novas and repeat bronchoscopy if needed.  6.  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 -May consider Xgeva monthly. Due to her unusual reaction to medications and recent dental issue, I have not started her Delton See yet.  Plan: -Return to clinic in 4 Weeks for folllow up and lab  -I refilled her xanax and ativan today  -CT chest without contrast to follow-up her pleural effusion before next visit -She is planning to see Dr. Raquel [facility], who practice [facility]  All questions were answered. The patient knows to call the clinic with any problems, questions or concerns. I spent 25 minutes counseling the patient face to face. The total time spent in the appointment was 30 minutes and more than 50% was on counseling.     Truitt Merle, MD 06/01/2015

## 2015-06-01 NOTE — Telephone Encounter (Signed)
Talked with and scheduled this patient’s appointment(s) while patient was here in our office.       [facility]. °

## 2015-06-12 ENCOUNTER — Encounter (HOSPITAL_COMMUNITY): Payer: Self-pay | Admitting: Emergency Medicine

## 2015-06-12 ENCOUNTER — Emergency Department (HOSPITAL_COMMUNITY): Payer: Medicaid Other

## 2015-06-12 ENCOUNTER — Inpatient Hospital Stay (HOSPITAL_COMMUNITY)
Admission: EM | Admit: 2015-06-12 | Discharge: 2015-06-14 | DRG: 392 | Disposition: A | Discharge status: Home / Self Care | Payer: Medicaid Other | Attending: Internal Medicine | Admitting: Internal Medicine

## 2015-06-12 DIAGNOSIS — K5900 Constipation, unspecified: Secondary | ICD-10-CM | POA: Diagnosis present

## 2015-06-12 DIAGNOSIS — I313 Pericardial effusion (noninflammatory): Secondary | ICD-10-CM | POA: Diagnosis present

## 2015-06-12 DIAGNOSIS — Z806 Family history of leukemia: Secondary | ICD-10-CM | POA: Diagnosis not present

## 2015-06-12 DIAGNOSIS — Z888 Allergy status to other drugs, medicaments and biological substances status: Secondary | ICD-10-CM | POA: Diagnosis not present

## 2015-06-12 DIAGNOSIS — J910 Malignant pleural effusion: Secondary | ICD-10-CM | POA: Diagnosis present

## 2015-06-12 DIAGNOSIS — J189 Pneumonia, unspecified organism: Secondary | ICD-10-CM

## 2015-06-12 DIAGNOSIS — R1010 Upper abdominal pain, unspecified: Secondary | ICD-10-CM | POA: Diagnosis present

## 2015-06-12 DIAGNOSIS — C50919 Malignant neoplasm of unspecified site of unspecified female breast: Secondary | ICD-10-CM | POA: Diagnosis present

## 2015-06-12 DIAGNOSIS — R918 Other nonspecific abnormal finding of lung field: Secondary | ICD-10-CM | POA: Diagnosis not present

## 2015-06-12 DIAGNOSIS — R0600 Dyspnea, unspecified: Secondary | ICD-10-CM | POA: Diagnosis present

## 2015-06-12 DIAGNOSIS — C7800 Secondary malignant neoplasm of unspecified lung: Secondary | ICD-10-CM | POA: Diagnosis present

## 2015-06-12 DIAGNOSIS — F419 Anxiety disorder, unspecified: Secondary | ICD-10-CM | POA: Diagnosis present

## 2015-06-12 DIAGNOSIS — M549 Dorsalgia, unspecified: Secondary | ICD-10-CM

## 2015-06-12 DIAGNOSIS — Z79899 Other long term (current) drug therapy: Secondary | ICD-10-CM | POA: Diagnosis not present

## 2015-06-12 DIAGNOSIS — C50911 Malignant neoplasm of unspecified site of right female breast: Secondary | ICD-10-CM | POA: Diagnosis not present

## 2015-06-12 DIAGNOSIS — Z923 Personal history of irradiation: Secondary | ICD-10-CM

## 2015-06-12 DIAGNOSIS — J90 Pleural effusion, not elsewhere classified: Secondary | ICD-10-CM | POA: Diagnosis not present

## 2015-06-12 DIAGNOSIS — Z9012 Acquired absence of left breast and nipple: Secondary | ICD-10-CM | POA: Diagnosis not present

## 2015-06-12 LAB — LIPASE, BLOOD: LIPASE: 24 U/L (ref 11–51)

## 2015-06-12 LAB — COMPREHENSIVE METABOLIC PANEL
ALK PHOS: 78 U/L (ref 38–126)
ALT: 11 U/L — AB (ref 14–54)
AST: 21 U/L (ref 15–41)
Albumin: 4.4 g/dL (ref 3.5–5.0)
Anion gap: 12 (ref 5–15)
BILIRUBIN TOTAL: 0.4 mg/dL (ref 0.3–1.2)
BUN: 11 mg/dL (ref 6–20)
CALCIUM: 9.6 mg/dL (ref 8.9–10.3)
CHLORIDE: 105 mmol/L (ref 101–111)
CO2: 26 mmol/L (ref 22–32)
CREATININE: 0.81 mg/dL (ref 0.44–1.00)
Glucose, Bld: 97 mg/dL (ref 65–99)
Potassium: 3.8 mmol/L (ref 3.5–5.1)
Sodium: 143 mmol/L (ref 135–145)
TOTAL PROTEIN: 6.9 g/dL (ref 6.5–8.1)

## 2015-06-12 LAB — URINALYSIS, ROUTINE W REFLEX MICROSCOPIC
Bilirubin Urine: NEGATIVE
GLUCOSE, UA: NEGATIVE mg/dL
HGB URINE DIPSTICK: NEGATIVE
KETONES UR: NEGATIVE mg/dL
LEUKOCYTES UA: NEGATIVE
Nitrite: NEGATIVE
PROTEIN: NEGATIVE mg/dL
Specific Gravity, Urine: 1.01 (ref 1.005–1.030)
pH: 7.5 (ref 5.0–8.0)

## 2015-06-12 LAB — CBC
HCT: 40.9 % (ref 36.0–46.0)
Hemoglobin: 13.8 g/dL (ref 12.0–15.0)
MCH: 28.9 pg (ref 26.0–34.0)
MCHC: 33.7 g/dL (ref 30.0–36.0)
MCV: 85.6 fL (ref 78.0–100.0)
PLATELETS: 249 10*3/uL (ref 150–400)
RBC: 4.78 MIL/uL (ref 3.87–5.11)
RDW: 13 % (ref 11.5–15.5)
WBC: 4.7 10*3/uL (ref 4.0–10.5)

## 2015-06-12 LAB — STREP PNEUMONIAE URINARY ANTIGEN: Strep Pneumo Urinary Antigen: NEGATIVE

## 2015-06-12 MED ORDER — DIPHENHYDRAMINE HCL 50 MG/ML IJ SOLN
50.0000 mg | Freq: Once | INTRAMUSCULAR | Status: AC
  Administered 2015-06-12: 50 mg via INTRAVENOUS
  Filled 2015-06-12: qty 1

## 2015-06-12 MED ORDER — SODIUM CHLORIDE 0.9 % IV BOLUS (SEPSIS)
1000.0000 mL | Freq: Once | INTRAVENOUS | Status: AC
  Administered 2015-06-12: 1000 mL via INTRAVENOUS

## 2015-06-12 MED ORDER — LORAZEPAM 2 MG/ML IJ SOLN
1.0000 mg | Freq: Once | INTRAMUSCULAR | Status: AC
  Administered 2015-06-12: 1 mg via INTRAVENOUS
  Filled 2015-06-12: qty 1

## 2015-06-12 MED ORDER — DEXTROSE 5 % IV SOLN
1.0000 g | Freq: Once | INTRAVENOUS | Status: AC
  Administered 2015-06-12: 1 g via INTRAVENOUS
  Filled 2015-06-12: qty 10

## 2015-06-12 MED ORDER — ONDANSETRON HCL 4 MG/2ML IJ SOLN: 4.0000 mg | Freq: Four times a day (QID) | INTRAMUSCULAR | Status: DC | PRN

## 2015-06-12 MED ORDER — ALPRAZOLAM 0.5 MG PO TABS
0.5000 mg | ORAL_TABLET | Freq: Three times a day (TID) | ORAL | Status: DC | PRN
  Administered 2015-06-12 – 2015-06-14 (×5): 0.5 mg via ORAL
  Filled 2015-06-12 (×6): qty 1

## 2015-06-12 MED ORDER — MORPHINE SULFATE (PF) 2 MG/ML IV SOLN
1.0000 mg | INTRAVENOUS | Status: DC | PRN
  Administered 2015-06-12 – 2015-06-13 (×3): 2 mg via INTRAVENOUS
  Filled 2015-06-12 (×3): qty 1

## 2015-06-12 MED ORDER — SODIUM CHLORIDE 0.9 % IV SOLN
INTRAVENOUS | Status: AC
  Administered 2015-06-12 – 2015-06-13 (×2): via INTRAVENOUS

## 2015-06-12 MED ORDER — OXYCODONE HCL 5 MG PO TABS
5.0000 mg | ORAL_TABLET | ORAL | Status: DC | PRN
  Administered 2015-06-13: 5 mg via ORAL
  Filled 2015-06-12 (×3): qty 1

## 2015-06-12 MED ORDER — ACETAMINOPHEN 650 MG RE SUPP: 650.0000 mg | Freq: Four times a day (QID) | RECTAL | Status: DC | PRN

## 2015-06-12 MED ORDER — IOHEXOL 350 MG/ML SOLN
100.0000 mL | Freq: Once | INTRAVENOUS | Status: AC | PRN
  Administered 2015-06-12: 100 mL via INTRAVENOUS

## 2015-06-12 MED ORDER — ALBUTEROL SULFATE (2.5 MG/3ML) 0.083% IN NEBU: 2.5000 mg | INHALATION_SOLUTION | RESPIRATORY_TRACT | Status: DC | PRN

## 2015-06-12 MED ORDER — DEXTROSE 5 % IV SOLN
500.0000 mg | Freq: Once | INTRAVENOUS | Status: AC
  Administered 2015-06-12: 500 mg via INTRAVENOUS
  Filled 2015-06-12: qty 500

## 2015-06-12 MED ORDER — ENOXAPARIN SODIUM 40 MG/0.4ML ~~LOC~~ SOLN: 40.0000 mg | SUBCUTANEOUS | Status: DC

## 2015-06-12 MED ORDER — CEFTRIAXONE SODIUM 1 G IJ SOLR
1.0000 g | INTRAMUSCULAR | Status: DC
  Filled 2015-06-12: qty 10

## 2015-06-12 MED ORDER — ACETAMINOPHEN 325 MG PO TABS
650.0000 mg | ORAL_TABLET | Freq: Four times a day (QID) | ORAL | Status: DC | PRN
  Administered 2015-06-12 – 2015-06-13 (×2): 650 mg via ORAL
  Filled 2015-06-12 (×2): qty 2

## 2015-06-12 MED ORDER — ONDANSETRON HCL 4 MG PO TABS: 4.0000 mg | ORAL_TABLET | Freq: Four times a day (QID) | ORAL | Status: DC | PRN

## 2015-06-12 MED ORDER — DOCUSATE SODIUM 100 MG PO CAPS
100.0000 mg | ORAL_CAPSULE | Freq: Two times a day (BID) | ORAL | Status: DC
  Administered 2015-06-12 – 2015-06-13 (×4): 100 mg via ORAL
  Filled 2015-06-12 (×5): qty 1

## 2015-06-12 MED ORDER — AZITHROMYCIN 500 MG IV SOLR
500.0000 mg | INTRAVENOUS | Status: DC
  Filled 2015-06-12: qty 500

## 2015-06-12 NOTE — H&P (Signed)
[facility] History and Physical  Patricia Carter DZH:[id number] DOB: 1955/06/03 DOA: 06/12/2015   PCP: Gearlean Alf., PA-C  Specialists: Dr. Burr Medico his her oncologist. She is also followed by Dr. Melvyn Novas, pulmonology.  Chief Complaint: Pain in the upper abdomen, shortness of breath, cough  HPI: Patricia Carter is a 60 y.o. female with a past medical history of metastatic breast cancer who is not on any chemotherapeutic or hormonal treatment at this time. She completed palliative radiation treatment in March 2016. Patient was seen a few weeks ago for abdominal pain. She underwent CT scan which raised suspicion for duodenitis. She was seen by gastroenterology in [place]. Dr. Erlene Quan did an upper endoscopy which was benign. No evidence for duodenitis was noted. Patient comes back to the hospital today due to persistent abdominal pain, although the pain has moved up and is mainly in the right upper side. She also has some shoulder pain. Pain has been between 7-8 out of 10 in intensity. Described as aching pain on and off. No precipitating aggravating or relieving factors. Also complains of difficulty breathing and has had a cough with clear expectoration. Denies any blood in the sputum. No fever. Some nausea but no vomiting. Has had chest pain at times which she gets due to history of radiation treatment. She was seen by her oncologist on January 31 and at that point in time plan was to repeat a CT scan of her chest to monitor her pleural effusion. She was also seen by her pulmonologist recently.  Evaluation in the emergency department reveals enlarging right-sided pleural effusion with areas of consolidation.   Home Medications: Prior to Admission medications   Medication Sig Start Date End Date Taking? Authorizing Provider  ALPRAZolam Duanne Moron) 0.5 MG tablet Take 1 tablet (0.5 mg total) by mouth 3 (three) times daily as needed for anxiety. 06/01/15  Yes Truitt Merle, MD  Cholecalciferol (VITAMIN D3) 5000  UNITS CAPS Take 5,000 Units by mouth daily with supper.    Yes Historical Provider, MD  LORazepam (ATIVAN) 1 MG tablet Take 1 tablet (1 mg total) by mouth every 8 (eight) hours as needed (panic attacks anxiety related). 06/01/15  Yes Truitt Merle, MD  naproxen sodium (ANAPROX) 220 MG tablet Take 220 mg by mouth 2 (two) times daily as needed (pain).   Yes Historical Provider, MD  famotidine (PEPCID) 20 MG tablet One at bedtime Patient not taking: Reported on 05/04/2015 04/16/15   Tanda Rockers, MD  ondansetron (ZOFRAN ODT) 8 MG disintegrating tablet Take 1 tablet (8 mg total) by mouth every 8 (eight) hours as needed for nausea or vomiting. Patient not taking: Reported on 06/12/2015 05/23/15   Mercedes Camprubi-Soms, PA-C  oxyCODONE (OXY IR/ROXICODONE) 5 MG immediate release tablet Take 1-2 tablets (5-10 mg total) by mouth every 4 (four) hours as needed for severe pain. Patient not taking: Reported on 05/23/2015 05/14/15   Truitt Merle, MD  pantoprazole (PROTONIX) 40 MG tablet Take 1 tablet (40 mg total) by mouth daily. Take 30-60 min before first meal of the day Patient not taking: Reported on 05/04/2015 04/16/15   Tanda Rockers, MD  polyethylene glycol [facility] / Floria Raveling) packet Take 17 g by mouth daily. Patient not taking: Reported on 06/12/2015 05/23/15   Mercedes Camprubi-Soms, PA-C    Allergies:  Allergies  Allergen Reactions  . Iopamidol Hives    Uncoded Allergy. Allergen: DIZIPROMINE, Other Reaction: OTHER REACTION  . Omeprazole Other (See Comments)    Abdominal cramps and constipation   . Prednisone  Palpitations  . Zyrtec [Cetirizine] Palpitations    Past Medical History: Past Medical History  Diagnosis Date  . Breast cancer ([place])   . Breast cancer ([place])   . Lung mass     Past Surgical History  Procedure Laterality Date  . Mastectomy Left     2010  . Eye surgery      cataract surgery, wears contact left eye  . Subxyphoid pericardial window N/A 05/05/2014    Procedure: SUBXYPHOID  PERICARDIAL WINDOW;  Surgeon: Gaye Pollack, MD;  Location: [facility] OR;  Service: Thoracic;  Laterality: N/A;  . Flexible bronchoscopy N/A 05/05/2014    Procedure: FLEXIBLE BRONCHOSCOPY;  Surgeon: Gaye Pollack, MD;  Location: [place];  Service: Thoracic;  Laterality: N/A;  . Video bronchoscopy with endobronchial ultrasound N/A 05/05/2014    Procedure: VIDEO BRONCHOSCOPY WITH ENDOBRONCHIAL ULTRASOUND;  Surgeon: Gaye Pollack, MD;  Location: [place];  Service: Thoracic;  Laterality: N/A;    Social History: Patient lives in [place] by herself. No smoking, alcohol use or illicit drug use. Due to her worsening pain over the last 2 weeks she has had decreased mobility  Family History:  Family History  Problem Relation Age of Onset  . Cancer Paternal Aunt     leukemia  . Cancer Father 38    prostate cancer      Review of Systems - History obtained from the patient General ROS: positive for  - fatigue Psychological ROS: positive for - anxiety Ophthalmic ROS: negative ENT ROS: negative Allergy and Immunology ROS: negative Hematological and Lymphatic ROS: negative Endocrine ROS: negative Respiratory ROS: as in hpi Cardiovascular ROS: as in hpi Gastrointestinal ROS: as in hpi Genito-Urinary ROS: no dysuria, trouble voiding, or hematuria Musculoskeletal ROS: negative Neurological ROS: no TIA or stroke symptoms Dermatological ROS: negative  Physical Examination  Filed Vitals:   06/12/15 1002 06/12/15 1013 06/12/15 1030 06/12/15 1100  BP: 127/91  121/90 126/88  Pulse:  96 92 99  Temp:      Resp:      Height:      Weight:      SpO2:  98% 98% 100%    BP 126/88 mmHg  Pulse 99  Temp(Src) 97.9 F (36.6 C)  Resp 18  Ht 5' 3"  (1.6 m)  Wt 46.267 kg (102 lb)  BMI 18.07 kg/m2  SpO2 100%  General appearance: alert, cooperative, appears stated age and no distress Head: Normocephalic, without obvious abnormality, atraumatic Eyes: conjunctivae/corneas clear. PERRL, EOM's intact.  Throat:  lips, mucosa, and tongue normal; teeth and gums normal Neck: no adenopathy, no carotid bruit, no JVD, supple, symmetrical, trachea midline and thyroid not enlarged, symmetric, no tenderness/mass/nodules Resp: Diminished air entry at the bases, right more than left. Few crackles present in the right. Dullness to percussion on the right. No rhonchi. Cardio: regular rate and rhythm, S1, S2 normal, no murmur, click, rub or gallop GI: soft, non-tender; bowel sounds normal; no masses,  no organomegaly Extremities: extremities normal, atraumatic, no cyanosis or edema Pulses: 2+ and symmetric Skin: Skin color, texture, turgor normal. No rashes or lesions Lymph nodes: Cervical, supraclavicular, and axillary nodes normal. Neurologic: Alert and oriented 3. Cranial nerve II-12 intact. No focal neurological deficits are noted.  Laboratory Data: Results for orders placed or performed during the hospital encounter of 06/12/15 (from the past 48 hour(s))  Urinalysis, Routine w reflex microscopic (not at [facility])     Status: None   Collection Time: 06/12/15  5:55 AM  Result Value  Ref Range   Color, Urine YELLOW YELLOW   APPearance CLEAR CLEAR   Specific Gravity, Urine 1.010 1.005 - 1.030   pH 7.5 5.0 - 8.0   Glucose, UA NEGATIVE NEGATIVE mg/dL   Hgb urine dipstick NEGATIVE NEGATIVE   Bilirubin Urine NEGATIVE NEGATIVE   Ketones, ur NEGATIVE NEGATIVE mg/dL   Protein, ur NEGATIVE NEGATIVE mg/dL   Nitrite NEGATIVE NEGATIVE   Leukocytes, UA NEGATIVE NEGATIVE    Comment: MICROSCOPIC NOT DONE ON URINES WITH NEGATIVE PROTEIN, BLOOD, LEUKOCYTES, NITRITE, OR GLUCOSE <1000 mg/dL.  Lipase, blood     Status: None   Collection Time: 06/12/15  8:01 AM  Result Value Ref Range   Lipase 24 11 - 51 U/L  Comprehensive metabolic panel     Status: Abnormal   Collection Time: 06/12/15  8:01 AM  Result Value Ref Range   Sodium 143 135 - 145 mmol/L   Potassium 3.8 3.5 - 5.1 mmol/L   Chloride 105 101 - 111 mmol/L   CO2 26  22 - 32 mmol/L   Glucose, Bld 97 65 - 99 mg/dL   BUN 11 6 - 20 mg/dL   Creatinine, Ser 0.81 0.44 - 1.00 mg/dL   Calcium 9.6 8.9 - 10.3 mg/dL   Total Protein 6.9 6.5 - 8.1 g/dL   Albumin 4.4 3.5 - 5.0 g/dL   AST 21 15 - 41 U/L   ALT 11 (L) 14 - 54 U/L   Alkaline Phosphatase 78 38 - 126 U/L   Total Bilirubin 0.4 0.3 - 1.2 mg/dL   GFR calc non Af Amer >60 >60 mL/min   GFR calc Af Amer >60 >60 mL/min    Comment: (NOTE) The eGFR has been calculated using the CKD EPI equation. This calculation has not been validated in all clinical situations. eGFR's persistently <60 mL/min signify possible Chronic Kidney Disease.    Anion gap 12 5 - 15  CBC     Status: None   Collection Time: 06/12/15  8:01 AM  Result Value Ref Range   WBC 4.7 4.0 - 10.5 K/uL   RBC 4.78 3.87 - 5.11 MIL/uL   Hemoglobin 13.8 12.0 - 15.0 g/dL   HCT 40.9 36.0 - 46.0 %   MCV 85.6 78.0 - 100.0 fL   MCH 28.9 26.0 - 34.0 pg   MCHC 33.7 30.0 - 36.0 g/dL   RDW 13.0 11.5 - 15.5 %   Platelets 249 150 - 400 K/uL    Radiology Reports: Ct Angio Chest Pe W/cm &/or Wo Cm  06/12/2015  CLINICAL DATA:  Shortness of Breath and pleural effusion. History of breast carcinoma EXAM: CT ANGIOGRAPHY CHEST WITH CONTRAST TECHNIQUE: Multidetector CT imaging of the chest was performed using the standard protocol during bolus administration of intravenous contrast. Multiplanar CT image reconstructions and MIPs were obtained to evaluate the vascular anatomy. CONTRAST:  194m OMNIPAQUE IOHEXOL 350 MG/ML SOLN COMPARISON:  Chest CT January 02, 2015; chest radiograph June 12, 2015 FINDINGS: There is no demonstrable pulmonary embolus. There is no thoracic aortic aneurysm or dissection. The visualized great vessels appear normal. The right and left common carotid arteries arise as a common trunk, an anatomic variant. There is a small pericardial effusion. There is a sizable right pleural effusion with airspace consolidation in portions of the right  middle and lower lobes. There is also mild airspace consolidation in the posterior segment right upper lobe. There is scarring in the right apex. There is also scarring in the right perihilar region, likely related  to prior radiation therapy. There is an 8 x 6 mm nodular opacity in the anterior segment of the right upper lobe which appears more prominent than on the previous study. There is probable loculated effusion along the right major fissure. Thyroid appears normal.  No adenopathy is demonstrable. There are breast implants bilaterally. Thickening in the anterior right breast is stable and probably due to previous radiation therapy change. In the visualized upper abdomen, no focal lesions are identified. There are no blastic or lytic bone lesions. Review of the MIP images confirms the above findings. IMPRESSION: No demonstrable pulmonary embolus. Increase in right pleural effusion with areas of consolidation on the right, increased. There is post radiation therapy change on the right with retraction/scarring as well. There is a nodular opacity in the anterior segment right upper lobe which is increased from prior study, concerning for a potential metastatic focus. No demonstrable thoracic adenopathy. There is a small pericardial effusion of uncertain etiology. Patient has had breast implants bilaterally. Soft tissue thickening anterior to the right breast implant is likely secondary to post radiation therapy change. Electronically Signed   By: Lowella Grip III M.D.   On: 06/12/2015 09:45   Dg Abd Acute W/chest  06/12/2015  CLINICAL DATA:  One month history of abdominal pain. History of breast carcinoma EXAM: DG ABDOMEN ACUTE W/ 1V CHEST COMPARISON:  Chest radiograph April 16, 2015; CT abdomen and pelvis May 23, 2015 FINDINGS: PA chest: There is a sizable right pleural effusion with right base airspace consolidation, increased. Left lung is clear. Heart size and pulmonary vascularity normal. No  adenopathy apparent. Patient is status post mastectomy on the left. Supine and upright abdomen: There is diffuse stool throughout the colon. There is no bowel dilatation or air-fluid level suggesting obstruction. No free air. There is a phlebolith in the right pelvis. No bone lesions are evident. IMPRESSION: Large right pleural effusion with right base consolidation, increased. Left lung clear. Diffuse stool throughout colon. No demonstrable bowel obstruction or free air. Electronically Signed   By: Lowella Grip III M.D.   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 Her symptoms are most likely due to her pleural effusion and consolidation noted on CT scan.  Plan: #1 Upper abdominal pain: This is most likely referred pain from her lungs. Most likely due to pleural effusion and consolidation. Pain control. She had a recent CT scan of her abdomen and no concerning findings were noted except for duodenitis which was ruled out by endoscopy. LFTs are normal. Lipase is normal.  #2 Right-sided pleural effusion and consolidation: Effusion appears to have enlarged in size. However, patient is not hypoxic. It is reasonable to give her IV antibiotics for now. 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 Her oncologist will have to weigh in regarding further options which may include thoracentesis and/or a Pleurx catheter. There is no urgent need for intervention at this time.  #3 Metastatic breast cancer: Currently not getting any treatment. Management per oncology.  #4 history of anxiety: Continue with  Xanax.   DVT Prophylaxis: Lovenox Code Status: Full code Family  Communication: Discussed with the patient  Disposition Plan: Admit to MedSurg   Further management decisions will depend on results of further testing and patient's response to treatment.   [name]  [facility] Pager [phone]  If 7PM-7AM, please contact night-coverage [email or web address] Password [email or web address]  06/12/2015, 11:26 AM

## 2015-06-12 NOTE — ED Notes (Signed)
Patient is having pain on the right lower side of her abdomen. Patient states that she wants a chaplin. Chaplin was called. Patient is crying and upset.

## 2015-06-12 NOTE — ED Notes (Signed)
Patient transported to X-ray and returned to room without distress noted. 

## 2015-06-12 NOTE — ED Notes (Signed)
Pt had no adverse reaction to IV ABT.

## 2015-06-12 NOTE — ED Notes (Signed)
Chaplin at bedside

## 2015-06-12 NOTE — ED Notes (Signed)
Patient requested a chaplin. Secretary called the chaplin for the patient.

## 2015-06-12 NOTE — ED Notes (Signed)
Dr. Krishnan at bedside. 

## 2015-06-12 NOTE — ED Notes (Signed)
Pt. Refused lab draws. RN,Janeen made aware.

## 2015-06-12 NOTE — ED Provider Notes (Signed)
CSN: [id number]     Arrival date & time 06/12/15  0419 History   First MD Initiated Contact with Patient 06/12/15 0701     Chief Complaint  Patient presents with  . Anxiety  . Abdominal Pain     (Consider location/radiation/quality/duration/timing/severity/associated sxs/prior Treatment) The history is provided by the patient.  Asenath Affinito is a 60 y.o. female hx of breast cancer, lung mass here with persistent abdominal pain. Patient has been having chronic abdominal pain for months. Patient actually came into the ER about 2 weeks ago and had extensive workup. She had CT abdomen pelvis that showed possible duodenal nidus as well as persistent right pleural effusion. She saw GI in the meantime, and had an endoscopy that showed no obvious duodenitis so Carafate was discontinued. She still has persistent right upper quadrant pain and back pain. Denies any urinary symptoms or fevers. She has worsening shortness of breath as well. In triage, patient was requesting a chaplain and was crying and tearful.       Past Medical History  Diagnosis Date  . Breast cancer ([place])   . Breast cancer ([place])   . Lung mass    Past Surgical History  Procedure Laterality Date  . Mastectomy Left     2010  . Eye surgery      cataract surgery, wears contact left eye  . Subxyphoid pericardial window N/A 05/05/2014    Procedure: SUBXYPHOID PERICARDIAL WINDOW;  Surgeon: Gaye Pollack, MD;  Location: [facility] OR;  Service: Thoracic;  Laterality: N/A;  . Flexible bronchoscopy N/A 05/05/2014    Procedure: FLEXIBLE BRONCHOSCOPY;  Surgeon: Gaye Pollack, MD;  Location: [facility] OR;  Service: Thoracic;  Laterality: N/A;  . Video bronchoscopy with endobronchial ultrasound N/A 05/05/2014    Procedure: VIDEO BRONCHOSCOPY WITH ENDOBRONCHIAL ULTRASOUND;  Surgeon: Gaye Pollack, MD;  Location: [facility] OR;  Service: Thoracic;  Laterality: N/A;   Family History  Problem Relation Age of Onset  . Cancer Paternal Aunt     leukemia  . Cancer  Father 2    prostate cancer    Social History  Substance Use Topics  . Smoking status: Never Smoker   . Smokeless tobacco: Never Used  . Alcohol Use: 0.0 oz/week    0 Standard drinks or equivalent per week     Comment: occ   OB History    No data available     Review of Systems  Gastrointestinal: Positive for abdominal pain.  All other systems reviewed and are negative.     Allergies  Iopamidol; Omeprazole; Prednisone; and Zyrtec  Home Medications   Prior to Admission medications   Medication Sig Start Date End Date Taking? Authorizing Provider  ALPRAZolam Duanne Moron) 0.5 MG tablet Take 1 tablet (0.5 mg total) by mouth 3 (three) times daily as needed for anxiety. 06/01/15  Yes Truitt Merle, MD  Cholecalciferol (VITAMIN D3) 5000 UNITS CAPS Take 5,000 Units by mouth daily with supper.    Yes Historical Provider, MD  LORazepam (ATIVAN) 1 MG tablet Take 1 tablet (1 mg total) by mouth every 8 (eight) hours as needed (panic attacks anxiety related). 06/01/15  Yes Truitt Merle, MD  naproxen sodium (ANAPROX) 220 MG tablet Take 220 mg by mouth 2 (two) times daily as needed (pain).   Yes Historical Provider, MD  famotidine (PEPCID) 20 MG tablet One at bedtime Patient not taking: Reported on 05/04/2015 04/16/15   Tanda Rockers, MD  ondansetron (ZOFRAN ODT) 8 MG disintegrating tablet Take 1 tablet (  8 mg total) by mouth every 8 (eight) hours as needed for nausea or vomiting. Patient not taking: Reported on 06/12/2015 05/23/15   Mercedes Camprubi-Soms, PA-C  oxyCODONE (OXY IR/ROXICODONE) 5 MG immediate release tablet Take 1-2 tablets (5-10 mg total) by mouth every 4 (four) hours as needed for severe pain. Patient not taking: Reported on 05/23/2015 05/14/15   Truitt Merle, MD  pantoprazole (PROTONIX) 40 MG tablet Take 1 tablet (40 mg total) by mouth daily. Take 30-60 min before first meal of the day Patient not taking: Reported on 05/04/2015 04/16/15   Tanda Rockers, MD  polyethylene glycol [facility] / Floria Raveling)  packet Take 17 g by mouth daily. Patient not taking: Reported on 06/12/2015 05/23/15   Mercedes Camprubi-Soms, PA-C   BP 130/108 mmHg  Pulse 100  Temp(Src) 97.9 F (36.6 C)  Resp 18  Ht 5\' 3"  (1.6 m)  Wt 102 lb (46.267 kg)  BMI 18.07 kg/m2  SpO2 99% Physical Exam  Constitutional: She is oriented to person, place, and time.  Anxious, tearful   HENT:  Head: Normocephalic.  Mouth/Throat: Oropharynx is clear and moist.  Eyes: Conjunctivae are normal. Pupils are equal, round, and reactive to light.  Neck: Normal range of motion. Neck supple.  Cardiovascular: Normal rate, regular rhythm and normal heart sounds.   Pulmonary/Chest: Effort normal and breath sounds normal. No respiratory distress. She has no wheezes. She has no rales.  Abdominal: Soft. Bowel sounds are normal.  + RUQ tenderness, no rebound   Musculoskeletal: Normal range of motion. She exhibits no edema or tenderness.  Neurological: She is alert and oriented to person, place, and time. No cranial nerve deficit. Coordination normal.  Skin: Skin is warm and dry.  Psychiatric: She has a normal mood and affect. Her behavior is normal. Judgment and thought content normal.  Nursing note and vitals reviewed.   ED Course  Procedures (including critical care time) Labs Review Labs Reviewed  COMPREHENSIVE METABOLIC PANEL - Abnormal; Notable for the following:    ALT 11 (*)    All other components within normal limits  LIPASE, BLOOD  CBC  URINALYSIS, ROUTINE W REFLEX MICROSCOPIC (NOT AT [facility])    Imaging Review Ct Angio Chest Pe W/cm &/or Wo Cm  06/12/2015  CLINICAL DATA:  Shortness of Breath and pleural effusion. History of breast carcinoma EXAM: CT ANGIOGRAPHY CHEST WITH CONTRAST TECHNIQUE: Multidetector CT imaging of the chest was performed using the standard protocol during bolus administration of intravenous contrast. Multiplanar CT image reconstructions and MIPs were obtained to evaluate the vascular anatomy. CONTRAST:   191mL OMNIPAQUE IOHEXOL 350 MG/ML SOLN COMPARISON:  Chest CT January 02, 2015; chest radiograph June 12, 2015 FINDINGS: There is no demonstrable pulmonary embolus. There is no thoracic aortic aneurysm or dissection. The visualized great vessels appear normal. The right and left common carotid arteries arise as a common trunk, an anatomic variant. There is a small pericardial effusion. There is a sizable right pleural effusion with airspace consolidation in portions of the right middle and lower lobes. There is also mild airspace consolidation in the posterior segment right upper lobe. There is scarring in the right apex. There is also scarring in the right perihilar region, likely related to prior radiation therapy. There is an 8 x 6 mm nodular opacity in the anterior segment of the right upper lobe which appears more prominent than on the previous study. There is probable loculated effusion along the right major fissure. Thyroid appears normal.  No adenopathy is demonstrable.  There are breast implants bilaterally. Thickening in the anterior right breast is stable and probably due to previous radiation therapy change. In the visualized upper abdomen, no focal lesions are identified. There are no blastic or lytic bone lesions. Review of the MIP images confirms the above findings. IMPRESSION: No demonstrable pulmonary embolus. Increase in right pleural effusion with areas of consolidation on the right, increased. There is post radiation therapy change on the right with retraction/scarring as well. There is a nodular opacity in the anterior segment right upper lobe which is increased from prior study, concerning for a potential metastatic focus. No demonstrable thoracic adenopathy. There is a small pericardial effusion of uncertain etiology. Patient has had breast implants bilaterally. Soft tissue thickening anterior to the right breast implant is likely secondary to post radiation therapy change. Electronically  Signed   By: Lowella Grip III M.D.   On: 06/12/2015 09:45   Dg Abd Acute W/chest  06/12/2015  CLINICAL DATA:  One month history of abdominal pain. History of breast carcinoma EXAM: DG ABDOMEN ACUTE W/ 1V CHEST COMPARISON:  Chest radiograph April 16, 2015; CT abdomen and pelvis May 23, 2015 FINDINGS: PA chest: There is a sizable right pleural effusion with right base airspace consolidation, increased. Left lung is clear. Heart size and pulmonary vascularity normal. No adenopathy apparent. Patient is status post mastectomy on the left. Supine and upright abdomen: There is diffuse stool throughout the colon. There is no bowel dilatation or air-fluid level suggesting obstruction. No free air. There is a phlebolith in the right pelvis. No bone lesions are evident. IMPRESSION: Large right pleural effusion with right base consolidation, increased. Left lung clear. Diffuse stool throughout colon. No demonstrable bowel obstruction or free air. Electronically Signed   By: Lowella Grip III M.D.   On: 06/12/2015 07:31   I have personally reviewed and evaluated these images and lab results as part of my medical decision-making.   EKG Interpretation None      MDM   Final diagnoses:  None   Aneila Musleh is a 60 y.o. female here with persistent RUQ pain, shortness of breath. CT 2 weeks ago showed no obvious acute chole but has pleural effusion. Patient had endoscopy recently that was unremarkable. Will repeat labs, get acute abdominal series.  10:17 AM Abdominal series showed worsening R pleural effusion.  CT showed no PE but has large effusion with consolidation. I think likely malignant effusion from cancer. Nl WBC. Given consolidation, will give IV abx empirically. Will need admission for pleural centesis vs pleural catheter.    Wandra Arthurs, MD 06/12/15 1017

## 2015-06-12 NOTE — ED Notes (Addendum)
Pt reported RUQ pain with nausea but no vomiting and diarrhea. Abd soft/nondistended but tender to palpate. BS (+) and active x 4 quadrants. Family at bedside

## 2015-06-13 DIAGNOSIS — C50919 Malignant neoplasm of unspecified site of unspecified female breast: Secondary | ICD-10-CM

## 2015-06-13 DIAGNOSIS — J90 Pleural effusion, not elsewhere classified: Secondary | ICD-10-CM

## 2015-06-13 DIAGNOSIS — M549 Dorsalgia, unspecified: Secondary | ICD-10-CM

## 2015-06-13 DIAGNOSIS — C7800 Secondary malignant neoplasm of unspecified lung: Secondary | ICD-10-CM

## 2015-06-13 DIAGNOSIS — R1010 Upper abdominal pain, unspecified: Principal | ICD-10-CM

## 2015-06-13 LAB — BASIC METABOLIC PANEL
ANION GAP: 7 (ref 5–15)
BUN: 7 mg/dL (ref 6–20)
CHLORIDE: 106 mmol/L (ref 101–111)
CO2: 25 mmol/L (ref 22–32)
Calcium: 8.6 mg/dL — ABNORMAL LOW (ref 8.9–10.3)
Creatinine, Ser: 0.67 mg/dL (ref 0.44–1.00)
GFR calc Af Amer: >60 mL/min (ref >60)
GFR calc non Af Amer: >60 mL/min (ref >60)
GLUCOSE: 94 mg/dL (ref 65–99)
POTASSIUM: 4.3 mmol/L (ref 3.5–5.1)
Sodium: 138 mmol/L (ref 135–145)

## 2015-06-13 LAB — CBC
HEMATOCRIT: 37.4 % (ref 36.0–46.0)
HEMOGLOBIN: 12 g/dL (ref 12.0–15.0)
MCH: 27.8 pg (ref 26.0–34.0)
MCHC: 32.1 g/dL (ref 30.0–36.0)
MCV: 86.8 fL (ref 78.0–100.0)
PLATELETS: 226 10*3/uL (ref 150–400)
RBC: 4.31 MIL/uL (ref 3.87–5.11)
RDW: 13.1 % (ref 11.5–15.5)
WBC: 4.1 10*3/uL (ref 4.0–10.5)

## 2015-06-13 MED ORDER — BISACODYL 5 MG PO TBEC
10.0000 mg | DELAYED_RELEASE_TABLET | Freq: Once | ORAL | Status: AC
  Administered 2015-06-13: 10 mg via ORAL
  Filled 2015-06-13: qty 2

## 2015-06-13 MED ORDER — MUSCLE RUB 10-15 % EX CREA
TOPICAL_CREAM | CUTANEOUS | Status: DC | PRN
  Filled 2015-06-13: qty 85

## 2015-06-13 MED ORDER — POLYETHYLENE GLYCOL 3350 17 G PO PACK: 17.0000 g | PACK | Freq: Once | ORAL | Status: DC

## 2015-06-13 MED ORDER — KETOROLAC TROMETHAMINE 30 MG/ML IJ SOLN
30.0000 mg | Freq: Four times a day (QID) | INTRAMUSCULAR | Status: DC
  Administered 2015-06-13: 30 mg via INTRAVENOUS
  Filled 2015-06-13 (×2): qty 1

## 2015-06-13 MED ORDER — KETOROLAC TROMETHAMINE 15 MG/ML IJ SOLN
15.0000 mg | Freq: Four times a day (QID) | INTRAMUSCULAR | Status: DC
  Administered 2015-06-13 – 2015-06-14 (×4): 15 mg via INTRAVENOUS
  Filled 2015-06-13 (×3): qty 1

## 2015-06-13 MED ORDER — FLEET ENEMA 7-19 GM/118ML RE ENEM
1.0000 | ENEMA | Freq: Once | RECTAL | Status: AC
  Administered 2015-06-13: 1 via RECTAL
  Filled 2015-06-13: qty 1

## 2015-06-13 MED ORDER — DICLOFENAC SODIUM 1 % TD GEL
4.0000 g | Freq: Four times a day (QID) | TRANSDERMAL | Status: DC
  Administered 2015-06-13 (×2): 4 g via TOPICAL
  Filled 2015-06-13: qty 100

## 2015-06-13 NOTE — Progress Notes (Signed)
[facility] Progress Note   Patricia Carter  [id number]  DOB: June 01, 1955  DOA: 06/12/2015 PCP: Gearlean Alf., PA-C  Brief narrative: Patricia Carter is a 60 y.o. female with a medical history of metastatic breast cancer status post palliative radiation in 3/16. The patient presents to the hospital for abdominal pain and back pain. She is admitted for pain control.   Subjective: Continues to have abdominal pain and back pain which is quite significant. He states the back pain is better when she sits up. No nausea or vomiting. Noted to have a large amount of stool on x-ray but tells me that she's been having normal bowel movements at home. No cough and no shortness of breath.  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  -She has a large sided pleural effusion which is chronic -This time it is not causing hypoxia or dyspnea and therefore we'll continue to monitor -Consolidation in the right lower lobe but she has no symptoms of cough or fever and there is no leukocytosis and therefore I have discontinued antibiotics as it does not appear to be a pneumonia     Antibiotics: Anti-infectives    Start     Dose/Rate Route Frequency Ordered Stop   06/13/15 1000  cefTRIAXone (ROCEPHIN) 1 g in dextrose 5 % 50 mL IVPB  Status:  Discontinued     1 g 100 mL/hr over 30 Minutes Intravenous Every 24 hours 06/12/15 1205 06/13/15 0832   06/13/15  1000  azithromycin (ZITHROMAX) 500 mg in dextrose 5 % 250 mL IVPB  Status:  Discontinued     500 mg 250 mL/hr over 60 Minutes Intravenous Every 24 hours 06/12/15 1205 06/13/15 0832   06/12/15 1015  cefTRIAXone (ROCEPHIN) 1 g in dextrose 5 % 50 mL IVPB     1 g 100 mL/hr over 30 Minutes Intravenous  Once 06/12/15 1006 06/12/15 1050   06/12/15 1015  azithromycin (ZITHROMAX) 500 mg in dextrose 5 % 250 mL IVPB     500 mg 250 mL/hr over 60 Minutes Intravenous  Once 06/12/15 1006 06/12/15 1150     Code Status:     Code Status Orders        Start     Ordered   06/12/15 1206  Full code   Continuous     06/12/15 1205    Code Status History    Date Active Date Inactive Code Status Order ID Comments User Context   05/05/2014  3:47 PM 05/09/2014  6:51 PM Full Code [id number]  Gaye Pollack, MD Inpatient   05/04/2014  3:22 PM 05/05/2014  3:47 PM Full Code [id number]  Tanda Rockers, MD Inpatient    Advance Directive Documentation        Most Recent Value   Type of Advance Directive  Healthcare Power of Attorney   Pre-existing out of facility DNR order (yellow form or pink MOST form)     "MOST" Form  in Place?       Family Communication:  Disposition Plan: Home in 1-2 days once pain improves DVT prophylaxis: Lovenox Consultants: None Procedures: None    Objective: Filed Weights   06/12/15 0509  Weight: 46.267 kg (102 lb)    Intake/Output Summary (Last 24 hours) at 06/13/15 1350 Last data filed at 06/13/15 0719  Gross per 24 hour  Intake      0 ml  Output   1050 ml  Net  -1050 ml     Vitals Filed Vitals:   06/12/15 1100 06/12/15 1230 06/12/15 2004 06/13/15 0518  BP: 126/88 136/87 111/63 137/77  Pulse: 99 104 84 83  Temp:  97.6 F (36.4 C) 98.3 F (36.8 C) 98.7 F (37.1 C)  TempSrc:  Axillary Oral Oral  Resp:  16 16 16   Height:      Weight:      SpO2: 100% 100% 99% 96%    Exam:  General:  Pt is alert, not in acute distress  HEENT: No icterus, No thrush, oral mucosa  moist  Cardiovascular: regular rate and rhythm, S1/S2 No murmur  Respiratory: clear to auscultation bilaterally   Abdomen: Soft, +Bowel sounds,  point tenderness just to the right of the epigastrium under the rib cage , non distended, no guarding  MSK: No cyanosis or clubbing- no pedal edema- tenderness in the right mid back along the spine which improves with palpation   Data Reviewed: Basic Metabolic Panel:  Recent Labs Lab 06/12/15 0801 06/13/15 0432  NA 143 138  K 3.8 4.3  CL 105 106  CO2 26 25  GLUCOSE 97 94  BUN 11 7  CREATININE 0.81 0.67  CALCIUM 9.6 8.6*   Liver Function Tests:  Recent Labs Lab 06/12/15 0801  AST 21  ALT 11*  ALKPHOS 78  BILITOT 0.4  PROT 6.9  ALBUMIN 4.4    Recent Labs Lab 06/12/15 0801  LIPASE 24   No results for input(s): AMMONIA in the last 168 hours. CBC:  Recent Labs Lab 06/12/15 0801 06/13/15 0432  WBC 4.7 4.1  HGB 13.8 12.0  HCT 40.9 37.4  MCV 85.6 86.8  PLT 249 226   Cardiac Enzymes: No results for input(s): CKTOTAL, CKMB, CKMBINDEX, TROPONINI in the last 168 hours. BNP (last 3 results)  Recent Labs  01/02/15 1349  BNP 28.5    ProBNP (last 3 results) No results for input(s): PROBNP in the last 8760 hours.  CBG: No results for input(s): GLUCAP in the last 168 hours.  No results found for this or any previous visit (from the past 240 hour(s)).   Studies: Ct Angio Chest Pe W/cm &/or Wo Cm  06/12/2015  CLINICAL DATA:  Shortness of Breath and pleural effusion. History of breast carcinoma EXAM: CT ANGIOGRAPHY CHEST WITH CONTRAST TECHNIQUE: Multidetector CT imaging of the chest was performed using the standard protocol during bolus administration of intravenous contrast. Multiplanar CT image reconstructions and MIPs were obtained to evaluate the vascular anatomy. CONTRAST:  152mL OMNIPAQUE IOHEXOL 350 MG/ML SOLN COMPARISON:  Chest CT January 02, 2015; chest radiograph June 12, 2015 FINDINGS: There is no  demonstrable pulmonary embolus. There is no thoracic aortic aneurysm or dissection. The visualized great vessels appear normal. The right and left common carotid arteries arise as a common trunk, an anatomic variant. There is a small pericardial effusion. There is a sizable right pleural effusion with airspace consolidation in portions of the right middle and lower lobes. There is also mild airspace consolidation  in the posterior segment right upper lobe. There is scarring in the right apex. There is also scarring in the right perihilar region, likely related to prior radiation therapy. There is an 8 x 6 mm nodular opacity in the anterior segment of the right upper lobe which appears more prominent than on the previous study. There is probable loculated effusion along the right major fissure. Thyroid appears normal.  No adenopathy is demonstrable. There are breast implants bilaterally. Thickening in the anterior right breast is stable and probably due to previous radiation therapy change. In the visualized upper abdomen, no focal lesions are identified. There are no blastic or lytic bone lesions. Review of the MIP images confirms the above findings. IMPRESSION: No demonstrable pulmonary embolus. Increase in right pleural effusion with areas of consolidation on the right, increased. There is post radiation therapy change on the right with retraction/scarring as well. There is a nodular opacity in the anterior segment right upper lobe which is increased from prior study, concerning for a potential metastatic focus. No demonstrable thoracic adenopathy. There is a small pericardial effusion of uncertain etiology. Patient has had breast implants bilaterally. Soft tissue thickening anterior to the right breast implant is likely secondary to post radiation therapy change. Electronically Signed   By: Lowella Grip III M.D.   On: 06/12/2015 09:45   Dg Abd Acute W/chest  06/12/2015  CLINICAL DATA:  One month history of  abdominal pain. History of breast carcinoma EXAM: DG ABDOMEN ACUTE W/ 1V CHEST COMPARISON:  Chest radiograph April 16, 2015; CT abdomen and pelvis May 23, 2015 FINDINGS: PA chest: There is a sizable right pleural effusion with right base airspace consolidation, increased. Left lung is clear. Heart size and pulmonary vascularity normal. No adenopathy apparent. Patient is status post mastectomy on the left. Supine and upright abdomen: There is diffuse stool throughout the colon. There is no bowel dilatation or air-fluid level suggesting obstruction. No free air. There is a phlebolith in the right pelvis. No bone lesions are evident. IMPRESSION: Large right pleural effusion with right base consolidation, increased. Left lung clear. Diffuse stool throughout colon. No demonstrable bowel obstruction or free air. Electronically Signed   By: Lowella Grip III M.D.   On: 06/12/2015 07:31    Scheduled Meds:  Scheduled Meds: . diclofenac sodium  4 g Topical QID  . docusate sodium  100 mg Oral BID  . enoxaparin (LOVENOX) injection  40 mg Subcutaneous Q24H  . ketorolac  30 mg Intravenous Q6H   Continuous Infusions: . sodium chloride 100 mL/hr at 06/13/15 1313    Time spent on care of this patient: 35 min   Lynn, MD 06/13/2015, 1:50 PM  LOS: 1 day   [facility] Office  [phone] Pager - Text Page per [email or web address] If 7PM-7AM, please contact night-coverage [email or web address]

## 2015-06-13 NOTE — Plan of Care (Signed)
Problem: Tissue Perfusion: Goal: Risk factors for ineffective tissue perfusion will decrease Outcome: Progressing Declined lovenox injection, but did ambulate around unit x2 per RN's suggestion.
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
## 2015-06-14 ENCOUNTER — Telehealth: Payer: Self-pay | Admitting: Hematology

## 2015-06-14 ENCOUNTER — Other Ambulatory Visit: Payer: Self-pay | Admitting: Hematology

## 2015-06-14 DIAGNOSIS — R918 Other nonspecific abnormal finding of lung field: Secondary | ICD-10-CM

## 2015-06-14 DIAGNOSIS — C50911 Malignant neoplasm of unspecified site of right female breast: Secondary | ICD-10-CM

## 2015-06-14 LAB — LEGIONELLA ANTIGEN, URINE

## 2015-06-14 NOTE — Telephone Encounter (Signed)
per pof to CX pt CT chest-cld [facility] and CX-cld & spoke to pt and gave pt time & date of next appt and adv CT was CX

## 2015-06-14 NOTE — Progress Notes (Signed)
Patient given discharge instructions, and verbalized an understanding of all discharge instructions.  Patient agrees with discharge plan, and is being discharged in stable medical condition.  Patient given transportation via wheelchair. 

## 2015-06-14 NOTE — Progress Notes (Signed)
CSW received referral, but pt discharged before CSW able to assess.  Alison Murray, MSW, [employer] [phone]

## 2015-06-14 NOTE — Progress Notes (Signed)
Patient walked around full length loop, with oxygen saturations 98 to 100%.

## 2015-06-14 NOTE — Discharge Summary (Addendum)
Physician Discharge Summary  Patricia Carter [id number] DOB: Sep 24, 1955 DOA: 06/12/2015  PCP: Worthy Keeler C., PA-C  Admit date: 06/12/2015 Discharge date: 06/14/2015  Time spent: 50 minutes    Discharge Condition: Stable    Discharge Diagnoses:  Principal Problem:   Upper abdominal pain Active Problems:   Back pain   Lung mass   Breast cancer metastasized to lung [facility])   Anxiety   Pleural effusion   History of present illness:  Patricia Carter is a 60 y.o. female with a medical history of metastatic breast cancer status post palliative radiation in 3/16. She has a right hilar mass and a malignant right pleural effusion as well which is being monitored by her outpatient physicians. The patient presents to the hospital for abdominal pain and back pain. She is admitted for pain control.    Hospital Course:  Principal Problem:  Upper abdominal pain -She has point tenderness under the rib cage just to the right of the epigastrium and appears to be muscular pain- - pain has significantly improved with IV Toradol according to the patient -CT scan unrevealing -LFTs and lipase are normal - She has a large amount of stool noted on imaging and constipation may be adding to her pain and therefore given an enema with good stool output  Active Problems: Right-sided back pain -On exam it clearly appears to be muscular as rubbing the area actually helps to relieve some of the tenderness -Patient again states that Toradol has helped the pain improve. 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 Burr Medico who will follow-up with her in the office -The patient also states that she has an appointment tomorrow with another oncologist which will be her "fourth" oncologist opinion on her cancer  Pneumonia? -Noted to have Consolidation in the right lower lobe and started on antibiotics in the ER-however, although she has a chronic cough, it is only productive of clear mucus still and she has no symptoms of fever-there is no leukocytosis -  I discontinued antibiotics on 2/12 which she was started on on the day of admission as it does not appear that she has a pneumonia - Symptoms were reassessed today- she admits to a chronic cough productive of clear sputum and no change in this cough lately  Severe anxiety - Anxiety is quite evident on talking to the patient-she is on Xanax and Ativan for this and can continue   Procedures:  None  Consultations:  Phone discussion with oncology  Discharge Exam: [facility] Weights   06/12/15 0509  Weight: 46.267 kg (102 lb)   Filed Vitals:   06/13/15 2000 06/14/15 0511  BP: 123/76 128/80  Pulse: 93 87  Temp: 98.7 F (37.1 C) 98 F (36.7 C)  Resp: 16 18    General: AAO x 3, no distress Cardiovascular: RRR, no murmurs  Respiratory: clear to auscultation bilaterally GI: soft, non-tender, non-distended, bowel sound positive  Discharge Instructions You were cared for by a hospitalist during your hospital  stay. If you have any questions about your discharge medications or the care you received while you were in the hospital after you are discharged, you can call the unit and asked to speak with the hospitalist on call if the hospitalist that took care of you is not available. Once you are discharged, your primary care physician will handle any further medical issues. Please note that NO REFILLS  for any discharge medications will be authorized once you are discharged, as it is imperative that you return to your primary care physician (or establish a relationship with a primary care physician if you do not have one) for your aftercare needs so that they can reassess your need for medications and monitor your lab values.      Discharge Instructions    Discharge instructions    Complete by:  As directed   Regular diet     Increase activity slowly    Complete by:  As directed             Medication List    STOP taking these medications        famotidine 20 MG tablet  Commonly known as:  PEPCID      TAKE these medications        ALPRAZolam 0.5 MG tablet  Commonly known as:  XANAX  Take 1 tablet (0.5 mg total) by mouth 3 (three) times daily as needed for anxiety.     LORazepam 1 MG tablet  Commonly known as:  ATIVAN  Take 1 tablet (1 mg total) by mouth every 8 (eight) hours as needed (panic attacks anxiety related).     naproxen sodium 220 MG tablet  Commonly known as:  ANAPROX  Take 220 mg by mouth 2 (two) times daily as needed (pain).     Vitamin D3 5000 units Caps  Take 5,000 Units by mouth daily with supper.       Allergies  Allergen Reactions  . Iopamidol Hives    Uncoded Allergy. Allergen: DIZIPROMINE, Other Reaction: OTHER REACTION  . Omeprazole Other (See Comments)    Abdominal cramps and constipation   . Prednisone Palpitations  . Zyrtec [Cetirizine] Palpitations      The results of significant diagnostics from this hospitalization (including imaging, microbiology, ancillary and laboratory) are listed below for reference.    Significant Diagnostic Studies: Ct Angio Chest Pe W/cm &/or Wo Cm  06/12/2015  CLINICAL DATA:  Shortness of Breath and pleural effusion. History of breast carcinoma EXAM: CT ANGIOGRAPHY CHEST WITH CONTRAST TECHNIQUE: Multidetector CT imaging of the chest was performed using the standard protocol during bolus administration of  intravenous contrast. Multiplanar CT image reconstructions and MIPs were obtained to evaluate the vascular anatomy. CONTRAST:  135mL OMNIPAQUE IOHEXOL 350 MG/ML SOLN COMPARISON:  Chest CT January 02, 2015; chest radiograph June 12, 2015 FINDINGS: There is no demonstrable pulmonary embolus. There is no thoracic aortic aneurysm or dissection. The visualized great vessels appear normal. The right and left common carotid arteries arise as a common trunk, an anatomic variant. There is a small pericardial effusion. There is a sizable right pleural effusion with airspace consolidation in portions of the right middle and lower lobes. There is also mild airspace consolidation in the posterior segment right upper lobe. There is scarring in the right apex. There is also scarring in the right perihilar region, likely related to prior radiation therapy. There is an 8 x 6 mm nodular opacity in the anterior segment of the right upper lobe which  appears more prominent than on the previous study. There is probable loculated effusion along the right major fissure. Thyroid appears normal.  No adenopathy is demonstrable. There are breast implants bilaterally. Thickening in the anterior right breast is stable and probably due to previous radiation therapy change. In the visualized upper abdomen, no focal lesions are identified. There are no blastic or lytic bone lesions. Review of the MIP images confirms the above findings. IMPRESSION: No demonstrable pulmonary embolus. Increase in right pleural effusion with areas of consolidation on the right, increased. There is post radiation therapy change on the right with retraction/scarring as well. There is a nodular opacity in the anterior segment right upper lobe which is increased from prior study, concerning for a potential metastatic focus. No demonstrable thoracic adenopathy. There is a small pericardial effusion of uncertain etiology. Patient has had breast implants bilaterally.  Soft tissue thickening anterior to the right breast implant is likely secondary to post radiation therapy change. Electronically Signed   By: Lowella Grip III M.D.   On: 06/12/2015 09:45   Ct Abdomen Pelvis W Contrast  05/23/2015  CLINICAL DATA:  Abdominal and back pain. Moderate nausea. Status post treated left breast cancer. EXAM: CT ABDOMEN AND PELVIS WITH CONTRAST TECHNIQUE: Multidetector CT imaging of the abdomen and pelvis was performed using the standard protocol following bolus administration of intravenous contrast. CONTRAST:  92mL OMNIPAQUE IOHEXOL 300 MG/ML SOLN, 54mL OMNIPAQUE IOHEXOL 300 MG/ML SOLN COMPARISON:  12/09/2014, 08/14/2014 CT of the abdomen pelvis. FINDINGS: Lower chest: There is moderate in size right pleural effusion. Linear peribronchial opacities are seen in the anterior portion of the visualized right lung, mostly right middle lobe. Anterior subpleural thickening is noted in the same portion of the lung. Bilateral subpectoral saline implants are in place. There has been a prior left mastectomy. The heart is normal in size. There is no pericardial effusion. Hepatobiliary: No masses or other significant abnormality. The gallbladder is normal. Pancreas: No mass, inflammatory changes, or other significant abnormality. Spleen: Within normal limits in size and appearance. Adrenals/Urinary Tract: No masses identified. No evidence of hydronephrosis. Stomach/Bowel: No evidence of obstruction, or abnormal fluid collections. There is persistent thickening of the duodenum. Vascular/Lymphatic: No pathologically enlarged lymph nodes. No evidence of abdominal aortic aneurysm. Mild atherosclerotic disease of the aorta. Reproductive: No mass or other significant abnormality. Other: None. Musculoskeletal:  No suspicious bone lesions identified. IMPRESSION: Moderate in size right pleural effusion. Peribronchovascular airspace disease in the right middle lobe, partially visualized, with associated  anterior right subpleural linear thickening. Differential diagnosis includes pneumonia, postobstructive atelectasis or lymphangitic spread or metastatic disease. Radiation field changes are felt unlikely. No CT evidence of acute abnormalities within the abdomen or pelvis. Persistent thickening of the duodenum. Nonspecific finding. This may represent a duodenitis. Correlation to upper endoscopy may be considered. Electronically Signed   By: Fidela Salisbury M.D.   On: 05/23/2015 15:06   Dg Abd Acute W/chest  06/12/2015  CLINICAL DATA:  One month history of abdominal pain. History of breast carcinoma EXAM: DG ABDOMEN ACUTE W/ 1V CHEST COMPARISON:  Chest radiograph April 16, 2015; CT abdomen and pelvis May 23, 2015 FINDINGS: PA chest: There is a sizable right pleural effusion with right base airspace consolidation, increased. Left lung is clear. Heart size and pulmonary vascularity normal. No adenopathy apparent. Patient is status post mastectomy on the left. Supine and upright abdomen: There is diffuse stool throughout the colon. There is no bowel dilatation or air-fluid level suggesting obstruction. No free  air. There is a phlebolith in the right pelvis. No bone lesions are evident. IMPRESSION: Large right pleural effusion with right base consolidation, increased. Left lung clear. Diffuse stool throughout colon. No demonstrable bowel obstruction or free air. Electronically Signed   By: Lowella Grip III M.D.   On: 06/12/2015 07:31    Microbiology: No results found for this or any previous visit (from the past 240 hour(s)).   Labs: Basic Metabolic Panel:  Recent Labs Lab 06/12/15 0801 06/13/15 0432  NA 143 138  K 3.8 4.3  CL 105 106  CO2 26 25  GLUCOSE 97 94  BUN 11 7  CREATININE 0.81 0.67  CALCIUM 9.6 8.6*   Liver Function Tests:  Recent Labs Lab 06/12/15 0801  AST 21  ALT 11*  ALKPHOS 78  BILITOT 0.4  PROT 6.9  ALBUMIN 4.4    Recent Labs Lab 06/12/15 0801   LIPASE 24   No results for input(s): AMMONIA in the last 168 hours. CBC:  Recent Labs Lab 06/12/15 0801 06/13/15 0432  WBC 4.7 4.1  HGB 13.8 12.0  HCT 40.9 37.4  MCV 85.6 86.8  PLT 249 226   Cardiac Enzymes: No results for input(s): CKTOTAL, CKMB, CKMBINDEX, TROPONINI in the last 168 hours. BNP: BNP (last 3 results)  Recent Labs  01/02/15 1349  BNP 28.5    ProBNP (last 3 results) No results for input(s): PROBNP in the last 8760 hours.  CBG: No results for input(s): GLUCAP in the last 168 hours.     SignedDebbe Odea, MD [facility] 06/14/2015, 11:38 AM

## 2015-06-15 ENCOUNTER — Encounter: Payer: Self-pay | Admitting: *Deleted

## 2015-06-15 NOTE — Progress Notes (Signed)
[place] [employer]  Hol[employer] representative received referral from Futures trader for anxiety and emotional concerns.  CSW has worked with patient in the past regarding similar issues/concerns.  CSW attempted to contact patient at home.  CSW left patient a message encouraging her to call CSW.  CSW team will continue attempting to contact patient.    Johnnye Lana, MSW, LCSW, OSW-C Clinical Social Worker [facility] [phone]

## 2015-06-17 ENCOUNTER — Telehealth: Payer: Self-pay | Admitting: Hematology

## 2015-06-17 NOTE — Telephone Encounter (Signed)
cld & spoke to pt and gave r/s appt adv appt @ 1:30 same day 2/23 md @ 2:00

## 2015-06-24 ENCOUNTER — Other Ambulatory Visit: Payer: Self-pay

## 2015-06-24 ENCOUNTER — Telehealth: Payer: Self-pay | Admitting: Internal Medicine

## 2015-06-24 ENCOUNTER — Encounter: Payer: Self-pay | Admitting: Hematology

## 2015-06-24 ENCOUNTER — Ambulatory Visit (HOSPITAL_COMMUNITY): Payer: Medicaid Other

## 2015-06-24 ENCOUNTER — Ambulatory Visit: Payer: Self-pay | Admitting: Hematology

## 2015-06-24 NOTE — Progress Notes (Signed)
Pt cancelled appointment  This encounter was created in error - please disregard.

## 2015-06-24 NOTE — Telephone Encounter (Signed)
per pof to sch pt appt-gave pt copy of avs °

## 2015-06-25 ENCOUNTER — Other Ambulatory Visit: Payer: Self-pay | Admitting: Hematology

## 2015-06-25 ENCOUNTER — Telehealth: Payer: Self-pay | Admitting: *Deleted

## 2015-06-25 NOTE — Telephone Encounter (Signed)
Let message for pt to return call to let us know how she is doing & to r/s appts.

## 2015-06-28 ENCOUNTER — Encounter: Payer: Self-pay | Admitting: General Practice

## 2015-06-28 NOTE — Progress Notes (Signed)
Spiritual Care Note  Referred by Dr Burr Medico for emotional support due to pt's anxiety.  Reached [place] by phone; she explained quickly that she has "a Theme park manager from my church supporting me" and "a nurse who comes to see me," stating repeatedly that she has no other needs at this time.  Affirmed importance of comfortable support and gently reminded her of ongoing Support Team availability as desired.  [place], [place], [facility] Pager [phone] Voicemail  [phone]

## 2015-06-29 ENCOUNTER — Other Ambulatory Visit: Payer: Self-pay

## 2015-06-29 ENCOUNTER — Ambulatory Visit: Payer: Self-pay | Admitting: Hematology

## 2015-07-05 ENCOUNTER — Telehealth: Payer: Self-pay | Admitting: Hematology

## 2015-07-05 ENCOUNTER — Other Ambulatory Visit: Payer: Self-pay | Admitting: *Deleted

## 2015-07-05 NOTE — Telephone Encounter (Signed)
Removed 3/6 pof from scheduling inbox - no action taken/needed. After speaking with desk nurse (YF) appointments were cxd due to patient seeking alternative tx/medicine. Desk nurse will follow up with patient.

## 2015-07-13 ENCOUNTER — Telehealth: Payer: Self-pay | Admitting: Hematology

## 2015-07-13 ENCOUNTER — Other Ambulatory Visit: Payer: Self-pay | Admitting: Hematology

## 2015-07-13 NOTE — Telephone Encounter (Signed)
pt cld to sch pt appt-gave pt time & date of appt for 3/23 @ 11-pt req no labs

## 2015-07-16 ENCOUNTER — Telehealth: Payer: Self-pay | Admitting: *Deleted

## 2015-07-16 NOTE — Telephone Encounter (Signed)
Called pt & she states that she is planning to come 07/22/15 @ 11 am to see Dr Burr Medico. She also states that she went to the ED/[place] this am with pain & they gave her # 15 ativan.  She sounded sleepy but coherent & states her pain is gone now & she is feeling better. She is in agreement to wait till she is seen to get scripts refilled.

## 2015-07-17 ENCOUNTER — Emergency Department (HOSPITAL_COMMUNITY): Payer: Medicaid Other

## 2015-07-17 ENCOUNTER — Emergency Department (HOSPITAL_COMMUNITY)
Admission: EM | Admit: 2015-07-17 | Discharge: 2015-07-17 | Disposition: A | Discharge status: Home / Self Care | Payer: Medicaid Other | Attending: Physician Assistant | Admitting: Physician Assistant

## 2015-07-17 ENCOUNTER — Encounter (HOSPITAL_COMMUNITY): Payer: Self-pay | Admitting: Emergency Medicine

## 2015-07-17 DIAGNOSIS — Z853 Personal history of malignant neoplasm of breast: Secondary | ICD-10-CM | POA: Diagnosis not present

## 2015-07-17 DIAGNOSIS — F419 Anxiety disorder, unspecified: Secondary | ICD-10-CM | POA: Insufficient documentation

## 2015-07-17 DIAGNOSIS — Z79899 Other long term (current) drug therapy: Secondary | ICD-10-CM | POA: Insufficient documentation

## 2015-07-17 DIAGNOSIS — R079 Chest pain, unspecified: Secondary | ICD-10-CM | POA: Diagnosis present

## 2015-07-17 MED ORDER — LORAZEPAM 0.5 MG PO TABS
0.5000 mg | ORAL_TABLET | Freq: Once | ORAL | Status: AC
  Administered 2015-07-17: 0.5 mg via ORAL
  Filled 2015-07-17: qty 1

## 2015-07-17 MED ORDER — IBUPROFEN 800 MG PO TABS
800.0000 mg | ORAL_TABLET | Freq: Once | ORAL | Status: AC
  Administered 2015-07-17: 800 mg via ORAL
  Filled 2015-07-17: qty 1

## 2015-07-17 MED ORDER — IBUPROFEN 800 MG PO TABS: 800.0000 mg | ORAL_TABLET | Freq: Three times a day (TID) | ORAL | Status: DC

## 2015-07-17 NOTE — ED Provider Notes (Signed)
CSN: [id number]     Arrival date & time 07/17/15  [id number] History   First MD Initiated Contact with Patient 07/17/15 [phone]     Chief Complaint  Patient presents with  . Chest Pain  . Anxiety  . Shortness of Breath     (Consider location/radiation/quality/duration/timing/severity/associated sxs/prior Treatment) HPI   Patient is 60 year old female with history of breast cancer metastatic to lung status post radiation last in January 2016. Patient presented tearful on exam with pain and anxiety. Patient states she has been seen several physicians about the fluid in her lung. She reports that she saw Dr. Morey Hummingbird, her oncologist. There is not enough the fluid to drain. She went to go see another physician in [place] who also said there was not enough fluid to drain. She also saw third physician as alternative therapist who also has not felt like there was any need for intervention. She reports though that she has chronic pain this area would like a physician take care of it.  Patient went to [facility] emergency room yesterday with this pain. She had a CAT scan which showed no need for intervention. Patient was given 15 of Ativan.   Patient is a follow-up on Thursday with her primary oncologist. She reports that she's not sure she can wait that long secondary to pain and anxiety. She is requesting a prescription for narcotic pain relief as well as for Xanax.  Patient is tearful on exam but redirectable.  Past Medical History  Diagnosis Date  . Breast cancer ([place])   . Breast cancer ([place])   . Lung mass    Past Surgical History  Procedure Laterality Date  . Mastectomy Left     2010  . Eye surgery      cataract surgery, wears contact left eye  . Subxyphoid pericardial window N/A 05/05/2014    Procedure: SUBXYPHOID PERICARDIAL WINDOW;  Surgeon: Gaye Pollack, MD;  Location: [facility] OR;  Service: Thoracic;  Laterality: N/A;  . Flexible bronchoscopy N/A 05/05/2014    Procedure: FLEXIBLE BRONCHOSCOPY;   Surgeon: Gaye Pollack, MD;  Location: [facility] OR;  Service: Thoracic;  Laterality: N/A;  . Video bronchoscopy with endobronchial ultrasound N/A 05/05/2014    Procedure: VIDEO BRONCHOSCOPY WITH ENDOBRONCHIAL ULTRASOUND;  Surgeon: Gaye Pollack, MD;  Location: [facility] OR;  Service: Thoracic;  Laterality: N/A;   Family History  Problem Relation Age of Onset  . Cancer Paternal Aunt     leukemia  . Cancer Father 12    prostate cancer    Social History  Substance Use Topics  . Smoking status: Never Smoker   . Smokeless tobacco: Never Used  . Alcohol Use: 0.0 oz/week    0 Standard drinks or equivalent per week     Comment: occ   OB History    No data available     Review of Systems  Constitutional: Negative for fever, activity change and fatigue.  HENT: Negative for congestion.   Respiratory: Negative for shortness of breath.   Cardiovascular: Positive for chest pain.  Gastrointestinal: Negative for abdominal pain.  Genitourinary: Negative for dysuria.  Psychiatric/Behavioral: Positive for sleep disturbance. The patient is nervous/anxious.       Allergies  Iopamidol; Omeprazole; Oxycodone; Prednisone; and Zyrtec  Home Medications   Prior to Admission medications   Medication Sig Start Date End Date Taking? Authorizing Provider  ALPRAZolam Duanne Moron) 0.5 MG tablet Take 1 tablet (0.5 mg total) by mouth 3 (three) times daily as needed for anxiety. 06/01/15  Yes Truitt Merle, MD  ergocalciferol (DRISDOL) 8000 UNIT/ML drops Take 8,000 Units by mouth daily.   Yes Historical Provider, MD  LORazepam (ATIVAN) 1 MG tablet Take 1 tablet (1 mg total) by mouth every 8 (eight) hours as needed (panic attacks anxiety related). 06/01/15  Yes Truitt Merle, MD  naproxen sodium (ANAPROX) 220 MG tablet Take 220 mg by mouth 2 (two) times daily as needed (pain).   Yes Historical Provider, MD  ibuprofen (ADVIL,MOTRIN) 800 MG tablet Take 1 tablet (800 mg total) by mouth 3 (three) times daily. 07/17/15   Patricia Carter  Patricia Lockyer, MD   BP 126/103 mmHg  Pulse 106  Temp(Src) 97.6 F (36.4 C) (Oral)  Resp 17  Ht 5\' 3"  (1.6 m)  Wt 100 lb (45.36 kg)  BMI 17.72 kg/m2  SpO2 98% Physical Exam  Constitutional: She is oriented to person, place, and time. She appears well-developed and well-nourished.  Patient appears very well.. Breathing normally. Normal heart rate.  HENT:  Head: Normocephalic and atraumatic.  Eyes: Conjunctivae are normal. Right eye exhibits no discharge.  Neck: Neck supple.  Cardiovascular: Normal rate, regular rhythm and normal heart sounds.   No murmur heard. Pulmonary/Chest: Effort normal and breath sounds normal. She has no wheezes. She has no rales.  Abdominal: Soft. She exhibits no distension. There is no tenderness.  Musculoskeletal: Normal range of motion. She exhibits no edema.  Neurological: She is oriented to person, place, and time. No cranial nerve deficit.  Skin: Skin is warm and dry. No rash noted. She is not diaphoretic.  Psychiatric:  Anxious and tearful.  Nursing note and vitals reviewed.   ED Course  Procedures (including critical care time) Labs Review Labs Reviewed - No data to display  Imaging Review Dg Chest 2 View  07/17/2015  CLINICAL DATA:  Shortness of breath EXAM: CHEST  2 VIEW COMPARISON:  June 12, 2015 FINDINGS: There is a large right pleural effusion and underlying opacity, larger in the interval. Mild patchy opacity in the aerated portion of the right upper lung is a little more prominent as well. The left lung remains clear. No pneumothorax. No other changes. IMPRESSION: Increasing right pleural fluid with underlying opacity. Mild patchy opacity in the aerated portion of the right upper lung is a little more prominent as well. Electronically Signed   By: Dorise Bullion III M.D   On: 07/17/2015 10:57   I have personally reviewed and evaluated these images and lab results as part of my medical decision-making.   EKG Interpretation None       MDM   Final diagnoses:  Anxiety   Patient is a 60 year old female with known metastatic breast cancer. Patient has malignant effusion which is known by her primary oncologist. Her primary college this does not think there is no fluid to drain. Patient's been seen by multiple other physicians who also agree that there is no intervention necessary at this point. Patient is breathing comfortably on room air 100%. Patient's not tachycardic. Patient is not tachypneic. Patient is anxious on exam. She reports that she would like Xanax in addition to Ativan. Patient was given a prescription yesterday for Ativan 15 tablets.  Given this I do not think comfortable prescribing additional anxiety or narcotic medications.  We'll get chest x-ray which showed there is no effusion at this different than baseline.   1:43 PM Chest x-ray is read as mild increase in pleural effusion.  I reviewed her CT done outside hospital. Patient has no evidence of  acute hypoxia, she has normal respiratory rate normal oxygenation. We walked patient around department she still is 100% on room air. Therefore there is no indication for acute evacuation opf this pleural collection. We do want her to follow up with her primary oncologist. We called them to make sure that they were aware that she is here. They will have her into the office next week. They instructed [place] to have her call 8:30 in the morning Monday.  Nevaen Tredway Julio Alm, MD 07/17/15 1343

## 2015-07-17 NOTE — ED Notes (Signed)
Bed: WA10 Expected date: 07/17/15 Expected time: 9:17 AM Means of arrival: Ambulance Comments: Ca pt increase fluid, diff breathing

## 2015-07-17 NOTE — Discharge Instructions (Signed)
Please call your oncologist Monday morning at 8:30 AM.  Generalized Anxiety Disorder Generalized anxiety disorder (GAD) is a mental disorder. It interferes with life functions, including relationships, work, and school. GAD is different from normal anxiety, which everyone experiences at some point in their lives in response to specific life events and activities. Normal anxiety actually helps [place] prepare for and get through these life events and activities. Normal anxiety goes away after the event or activity is over.  GAD causes anxiety that is not necessarily related to specific events or activities. It also causes excess anxiety in proportion to specific events or activities. The anxiety associated with GAD is also difficult to control. GAD can vary from mild to severe. People with severe GAD can have intense waves of anxiety with physical symptoms (panic attacks).  SYMPTOMS The anxiety and worry associated with GAD are difficult to control. This anxiety and worry are related to many life events and activities and also occur more days than not for 6 months or longer. People with GAD also have three or more of the following symptoms (one or more in children):  Restlessness.   Fatigue.  Difficulty concentrating.   Irritability.  Muscle tension.  Difficulty sleeping or unsatisfying sleep. DIAGNOSIS GAD is diagnosed through an assessment by your health care provider. Your health care provider will ask you questions aboutyour mood,physical symptoms, and events in your life. Your health care provider may ask you about your medical history and use of alcohol or drugs, including prescription medicines. Your health care provider may also do a physical exam and blood tests. Certain medical conditions and the use of certain substances can cause symptoms similar to those associated with GAD. Your health care provider may refer you to a mental health specialist for further evaluation. TREATMENT The  following therapies are usually used to treat GAD:   Medication. Antidepressant medication usually is prescribed for long-term daily control. Antianxiety medicines may be added in severe cases, especially when panic attacks occur.   Talk therapy (psychotherapy). Certain types of talk therapy can be helpful in treating GAD by providing support, education, and guidance. A form of talk therapy called cognitive behavioral therapy can teach you healthy ways to think about and react to daily life events and activities.  Stress managementtechniques. These include yoga, meditation, and exercise and can be very helpful when they are practiced regularly. A mental health specialist can help determine which treatment is best for you. Some people see improvement with one therapy. However, other people require a combination of therapies.   This information is not intended to replace advice given to you by your health care provider. Make sure you discuss any questions you have with your health care provider.   Document Released: 08/12/2012 Document Revised: 05/08/2014 Document Reviewed: 08/12/2012 Elsevier Interactive Patient Education [employer].

## 2015-07-17 NOTE — ED Notes (Signed)
Per EMS- Patient c/o chest pain, SOB, and anxiety. EKG-NSR. Patient reports that she is very anxious and stated that she is out of her Ativan. Patient was at [facility] ED yesterday and had a CT of chest. And was then discharged.

## 2015-07-17 NOTE — ED Notes (Signed)
Patient tearful when staff enters the room but , when patient is out in the hall with same staff, patient does not cry.

## 2015-07-19 ENCOUNTER — Telehealth: Payer: Self-pay | Admitting: Nurse Practitioner

## 2015-07-19 ENCOUNTER — Other Ambulatory Visit (HOSPITAL_BASED_OUTPATIENT_CLINIC_OR_DEPARTMENT_OTHER): Payer: Medicaid Other

## 2015-07-19 ENCOUNTER — Telehealth: Payer: Self-pay | Admitting: *Deleted

## 2015-07-19 ENCOUNTER — Other Ambulatory Visit: Payer: Self-pay | Admitting: *Deleted

## 2015-07-19 ENCOUNTER — Ambulatory Visit (HOSPITAL_BASED_OUTPATIENT_CLINIC_OR_DEPARTMENT_OTHER): Payer: Medicaid Other | Admitting: Nurse Practitioner

## 2015-07-19 VITALS — BP 127/86 | HR 109 | Temp 97.9°F | Resp 18 | Ht 63.0 in | Wt 98.0 lb

## 2015-07-19 DIAGNOSIS — C50919 Malignant neoplasm of unspecified site of unspecified female breast: Secondary | ICD-10-CM

## 2015-07-19 DIAGNOSIS — C50911 Malignant neoplasm of unspecified site of right female breast: Secondary | ICD-10-CM

## 2015-07-19 DIAGNOSIS — G893 Neoplasm related pain (acute) (chronic): Secondary | ICD-10-CM | POA: Diagnosis not present

## 2015-07-19 DIAGNOSIS — J90 Pleural effusion, not elsewhere classified: Secondary | ICD-10-CM

## 2015-07-19 DIAGNOSIS — F419 Anxiety disorder, unspecified: Secondary | ICD-10-CM

## 2015-07-19 DIAGNOSIS — C7800 Secondary malignant neoplasm of unspecified lung: Secondary | ICD-10-CM | POA: Diagnosis not present

## 2015-07-19 LAB — COMPREHENSIVE METABOLIC PANEL
ALBUMIN: 4 g/dL (ref 3.5–5.0)
ALK PHOS: 72 U/L (ref 40–150)
ALT: 15 U/L (ref 0–55)
ANION GAP: 10 meq/L (ref 3–11)
AST: 27 U/L (ref 5–34)
BILIRUBIN TOTAL: 0.5 mg/dL (ref 0.20–1.20)
BUN: 14.1 mg/dL (ref 7.0–26.0)
CALCIUM: 10 mg/dL (ref 8.4–10.4)
CO2: 28 mEq/L (ref 22–29)
Chloride: 98 mEq/L (ref 98–109)
Creatinine: 1 mg/dL (ref 0.6–1.1)
EGFR: 63 mL/min/{1.73_m2} — AB (ref >90)
GLUCOSE: 113 mg/dL (ref 70–140)
POTASSIUM: 4 meq/L (ref 3.5–5.1)
Sodium: 137 mEq/L (ref 136–145)
TOTAL PROTEIN: 7.5 g/dL (ref 6.4–8.3)

## 2015-07-19 LAB — CBC WITH DIFFERENTIAL/PLATELET
BASO%: 0.4 % (ref 0.0–2.0)
BASOS ABS: 0 10*3/uL (ref 0.0–0.1)
EOS ABS: 0 10*3/uL (ref 0.0–0.5)
EOS%: 0.3 % (ref 0.0–7.0)
HEMATOCRIT: 41.6 % (ref 34.8–46.6)
HEMOGLOBIN: 13.7 g/dL (ref 11.6–15.9)
LYMPH%: 10.4 % — ABNORMAL LOW (ref 14.0–49.7)
MCH: 27.8 pg (ref 25.1–34.0)
MCHC: 33 g/dL (ref 31.5–36.0)
MCV: 84.4 fL (ref 79.5–101.0)
MONO#: 0.8 10*3/uL (ref 0.1–0.9)
MONO%: 15.7 % — AB (ref 0.0–14.0)
NEUT%: 73.2 % (ref 38.4–76.8)
NEUTROS ABS: 3.6 10*3/uL (ref 1.5–6.5)
PLATELETS: 290 10*3/uL (ref 145–400)
RBC: 4.93 10*6/uL (ref 3.70–5.45)
RDW: 13.1 % (ref 11.2–14.5)
WBC: 4.9 10*3/uL (ref 3.9–10.3)
lymph#: 0.5 10*3/uL — ABNORMAL LOW (ref 0.9–3.3)

## 2015-07-19 LAB — I-STAT TROPONIN, ED: Troponin i, poc: 0.02 ng/mL (ref 0.00–0.08)

## 2015-07-19 MED ORDER — ALPRAZOLAM 0.5 MG PO TABS: ORAL_TABLET | ORAL | Status: DC

## 2015-07-19 MED ORDER — TRAMADOL HCL 50 MG PO TABS: 50.0000 mg | ORAL_TABLET | Freq: Four times a day (QID) | ORAL | Status: DC | PRN

## 2015-07-19 NOTE — Telephone Encounter (Signed)
Spoke with pt and informed pt that she will see Jenny Reichmann, NP symptom management today.  POF sent to scheduler.  Pt voiced understanding.

## 2015-07-19 NOTE — Telephone Encounter (Signed)
per pof to sch pt appt-cld pt and gave pt time & date of apptfor today w/Cindy Berniece Salines

## 2015-07-19 NOTE — Telephone Encounter (Signed)
"  I need to see Dr. Burr Medico ASAP to figure out what is going on.  I have pain at a rate of eleven on the pain scale.  I have fluid around and in back of in my right lung.  I do not understand why the fluid is not drawn off.  I go to the ED and am told my doctor has to manage this.  Ativan, aleve and ibuprofen do not help.  I can't wait until the scheduled 07-22-2015 F/U.  I'd like to see her today or have procedure scheduled to draw off fluid."  Return number [phone].

## 2015-07-20 ENCOUNTER — Telehealth: Payer: Self-pay | Admitting: *Deleted

## 2015-07-20 ENCOUNTER — Telehealth: Payer: Self-pay | Admitting: Hematology

## 2015-07-20 ENCOUNTER — Encounter: Payer: Self-pay | Admitting: Nurse Practitioner

## 2015-07-20 DIAGNOSIS — G893 Neoplasm related pain (acute) (chronic): Secondary | ICD-10-CM | POA: Insufficient documentation

## 2015-07-20 NOTE — Telephone Encounter (Signed)
Patient called in asking for collaborative nurse after call lost while on hod.  Call transferred to [phone].Marland Kitchen

## 2015-07-20 NOTE — Progress Notes (Signed)
SYMPTOM MANAGEMENT CLINIC   HPI: Patricia Carter 60 y.o. female diagnosed with breast cancer.  Patient is status post bilateral mastectomy in the past.  Patient has attempted multiple different treatment regimens in the past; but has now elected to undergo observation only.   Patient has a chronic right pleural effusion.  She reports increasing pain to the right rib area for the past several weeks.  She also continues with chronic fatigue; as well as shortness of breath with any exertion.  She denies any recent fevers or chills.  Patient CT scan, and of the chest/abdomen/pelvis.  A few weeks ago at [place] portion of her stable [facility] for restaging purposes.  This CT revealed a large right pleural effusion; with multiple areas of pleural nodularity in the right hemothorax.  Since that time.-Patient has been to an emergency department in [place] still, [place] and one of the colon.  Emergency departments as well for further evaluation of the pleural effusion.  Patient states that all of the physician.  She has seen recently.  Have advised patient that her pleural effusion is not large enough to undergo a thoracentesis.  Exam today reveals breath sounds essentially clear bilaterally; with some diminished basis.  Patient does appear slightly short of breath with conversation.  O2 sat was 100% on room air.  Dr Burr Medico in to examine patient as well; and has agreed with plan to move forward with evaluation for thoracentesis.  Will schedule a diagnostic/therapeutic right thoracentesis for sometime this week.  Patient was advised to call/return or go directly to the emergency department for any worsening symptoms whatsoever.  HPI  Review of Systems  Constitutional: Positive for malaise/fatigue. Negative for fever and chills.  Respiratory: Positive for shortness of breath.   Cardiovascular:       Chronic right rib pain.  All other systems reviewed and are negative.   Past Medical  History  Diagnosis Date  . Breast cancer ([place])   . Breast cancer ([place])   . Lung mass     Past Surgical History  Procedure Laterality Date  . Mastectomy Left     2010  . Eye surgery      cataract surgery, wears contact left eye  . Subxyphoid pericardial window N/A 05/05/2014    Procedure: SUBXYPHOID PERICARDIAL WINDOW;  Surgeon: Gaye Pollack, MD;  Location: [facility] OR;  Service: Thoracic;  Laterality: N/A;  . Flexible bronchoscopy N/A 05/05/2014    Procedure: FLEXIBLE BRONCHOSCOPY;  Surgeon: Gaye Pollack, MD;  Location: [place];  Service: Thoracic;  Laterality: N/A;  . 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    is allergic to iopamidol; omeprazole; oxycodone; prednisone; and zyrtec.    Medication List       This list is accurate as of: 07/19/15 11:59 PM.  Always use your most recent med list.               ALPRAZolam 0.5 MG tablet  Commonly known as:  XANAX  Take 1-2 tabs PO TID PRN anxiety.     ergocalciferol 8000 UNIT/ML drops  Commonly known  as:  DRISDOL  Take 8,000 Units by mouth daily.     ibuprofen 800 MG tablet  Commonly known as:  ADVIL,MOTRIN  Take 1 tablet (800 mg total) by mouth 3 (three) times daily.     LORazepam 1 MG tablet  Commonly known as:  ATIVAN  Take 1 tablet (1 mg total) by mouth every 8 (eight) hours as needed (panic attacks anxiety related).     naproxen sodium 220 MG tablet  Commonly known as:  ANAPROX  Take 220 mg by mouth 2 (two) times daily as needed (pain).     traMADol 50 MG tablet    Commonly known as:  ULTRAM  Take 1 tablet (50 mg total) by mouth every 6 (six) hours as needed.         PHYSICAL EXAMINATION  Oncology Vitals 07/19/2015 07/17/2015  Height 160 cm -  Weight 44.453 kg -  Weight (lbs) 98 lbs -  BMI (kg/m2) 17.36 kg/m2 -  Temp 97.9 -  Pulse 109 88  Resp 18 -  SpO2 100 100  BSA (m2) 1.41 m2 -   BP Readings from Last 2 Encounters:  07/19/15 127/86  07/17/15 111/86    Physical Exam  Constitutional: She is oriented to person, place, and time and well-developed, well-nourished, and in no distress.  HENT:  Head: Normocephalic and atraumatic.  Eyes: Conjunctivae and EOM are normal. Pupils are equal, round, and reactive to light. Right eye exhibits no discharge. Left eye exhibits no discharge. No scleral icterus.  Neck: Normal range of motion. Neck supple. No JVD present. No tracheal deviation present. No thyromegaly present.  Cardiovascular: Normal rate, regular rhythm, normal heart sounds and intact distal pulses.   Pulmonary/Chest: No stridor. No respiratory distress. She has no wheezes. She has no rales. She exhibits no tenderness.  Trace shortness of breath with conversation.  Abdominal: Soft. Bowel sounds are normal. She exhibits no distension and no mass. There is no tenderness. There is no rebound and no guarding.  Musculoskeletal: Normal range of motion. She exhibits no edema or tenderness.  Lymphadenopathy:    She has no cervical adenopathy.  Neurological: She is alert and oriented to person, place, and time. Gait normal.  Skin: Skin is warm and dry. No rash noted. No erythema. No pallor.  Psychiatric:  Patient appears anxious today.  Nursing note and vitals reviewed.   LABORATORY DATA:. Appointment on 07/19/2015  Component Date Value Ref Range Status  . WBC 07/19/2015 4.9  3.9 - 10.3 10e3/uL Final  . NEUT# 07/19/2015 3.6  1.5 - 6.5 10e3/uL Final  . HGB 07/19/2015 13.7  11.6 - 15.9 g/dL Final  . HCT 07/19/2015 41.6  34.8 - 46.6 % Final   . Platelets 07/19/2015 290  145 - 400 10e3/uL Final  . MCV 07/19/2015 84.4  79.5 - 101.0 fL Final  . MCH 07/19/2015 27.8  25.1 - 34.0 pg Final  . MCHC 07/19/2015 33.0  31.5 - 36.0 g/dL Final  . RBC 07/19/2015 4.93  3.70 - 5.45 10e6/uL Final  . RDW 07/19/2015 13.1  11.2 - 14.5 % Final  . lymph# 07/19/2015 0.5* 0.9 - 3.3 10e3/uL Final  . MONO# 07/19/2015 0.8  0.1 - 0.9 10e3/uL Final  . Eosinophils Absolute 07/19/2015 0.0  0.0 - 0.5 10e3/uL Final  . Basophils Absolute 07/19/2015 0.0  0.0 - 0.1 10e3/uL Final  . NEUT% 07/19/2015 73.2  38.4 - 76.8 % Final  . LYMPH% 07/19/2015 10.4* 14.0 - 49.7 % Final  . MONO% 07/19/2015  15.7* 0.0 - 14.0 % Final  . EOS% 07/19/2015 0.3  0.0 - 7.0 % Final  . BASO% 07/19/2015 0.4  0.0 - 2.0 % Final  . Sodium 07/19/2015 137  136 - 145 mEq/L Final  . Potassium 07/19/2015 4.0  3.5 - 5.1 mEq/L Final  . Chloride 07/19/2015 98  98 - 109 mEq/L Final  . CO2 07/19/2015 28  22 - 29 mEq/L Final  . Glucose 07/19/2015 113  70 - 140 mg/dl Final   Glucose reference range is for nonfasting patients. Fasting glucose reference range is 70- 100.  Marland Kitchen BUN 07/19/2015 14.1  7.0 - 26.0 mg/dL Final  . Creatinine 07/19/2015 1.0  0.6 - 1.1 mg/dL Final  . Total Bilirubin 07/19/2015 0.50  0.20 - 1.20 mg/dL Final  . Alkaline Phosphatase 07/19/2015 72  40 - 150 U/L Final  . AST 07/19/2015 27  5 - 34 U/L Final  . ALT 07/19/2015 15  0 - 55 U/L Final  . Total Protein 07/19/2015 7.5  6.4 - 8.3 g/dL Final  . Albumin 07/19/2015 4.0  3.5 - 5.0 g/dL Final  . Calcium 07/19/2015 10.0  8.4 - 10.4 mg/dL Final  . Anion Gap 07/19/2015 10  3 - 11 mEq/L Final  . EGFR 07/19/2015 63* >90 ml/min/1.73 m2 Final   eGFR is calculated using the CKD-EPI Creatinine Equation (2009)  Admission on 07/17/2015, Discharged on 07/17/2015  Component Date Value Ref Range Status  . Troponin i, poc 07/17/2015 0.02  0.00 - 0.08 ng/mL Final  . Comment 3 07/17/2015          Final   Comment: Due to the release kinetics of  cTnI, a negative result within the first hours of the onset of symptoms does not rule out myocardial infarction with certainty. If myocardial infarction is still suspected, repeat the test at appropriate intervals.      RADIOGRAPHIC STUDIES: Dg Chest 2 View  07/17/2015  CLINICAL DATA:  Shortness of breath EXAM: CHEST  2 VIEW COMPARISON:  June 12, 2015 FINDINGS: There is a large right pleural effusion and underlying opacity, larger in the interval. Mild patchy opacity in the aerated portion of the right upper lung is a little more prominent as well. The left lung remains clear. No pneumothorax. No other changes. IMPRESSION: Increasing right pleural fluid with underlying opacity. Mild patchy opacity in the aerated portion of the right upper lung is a little more prominent as well. Electronically Signed   By: Dorise Bullion III M.D   On: 07/17/2015 10:57    ASSESSMENT/PLAN:    Pleural effusion Patient has a chronic right pleural effusion.  She reports increasing pain to the right rib area for the past several weeks.  She also continues with chronic fatigue; as well as shortness of breath with any exertion.  She denies any recent fevers or chills.  Patient CT scan, and of the chest/abdomen/pelvis.  A few weeks ago at [place] portion of her stable [facility] for restaging purposes.  This CT revealed a large right pleural effusion; with multiple areas of pleural nodularity in the right hemothorax.  Since that time.-Patient has been to an emergency department in [place] still, [place] and one of the colon.  Emergency departments as well for further evaluation of the pleural effusion.  Patient states that all of the physician.  She has seen recently.  Have advised patient that her pleural effusion is not large enough to undergo a thoracentesis.  Exam today reveals breath sounds essentially  clear bilaterally; with some diminished basis.  Patient does appear slightly short of breath with  conversation.  O2 sat was 100% on room air.  Dr Burr Medico in to examine patient as well; and has agreed with plan to move forward with evaluation for thoracentesis.  Will schedule a diagnostic/therapeutic right thoracentesis for sometime this week.  Patient was advised to call/return or go directly to the emergency department for any worsening symptoms whatsoever.  Cancer associated pain Patient states that she is expecting increasing pain to the right rib area.  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  She's also been diagnosed with a large right pleural effusion recently; is requesting consideration for thoracentesis.  Patient states that she is unable to take oxycodone; since it causes headaches, nausea, and dizziness.  She feels fairly certain she would be unable to tolerate hydrocodone as well.  After long discussion with patient; will try Ultram to see if this helps with patient's pain.  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  She has undergone multiple treatment regimens in the past; with very poor tolerance of all.  She last received Faslodex in November 2016.  Dr. Burr Medico in to discuss all with patient; the patient once again is adamant that she continue with observation only at this point.  She states that she is researching multiple alternative therapies at this point; and is seeing a physician that is helping her with her diet and supplements.  Patient was advised that could change her mind at any time.  Will schedule a right thoracentesis for sometime this week.  Patient will need to return to the clinic for follow-up around 08/05/2015 for a visit only.  Patient knows to call in the interim with any new worries or concerns.  Anxiety Patient has a long history of chronic anxiety.  She typically takes Xanax 0.5 mg 3 times a day on an as-needed basis; and also takes Ativan for severe anxiety  attacks.  She has been out of the Xanax for the past week or so; and was prescribed Ativan 15 tablets.  During one of her recent emergency department visits.  She states that the Ativan is not helping her with her anxiety or sleep.  After long discussion with the patient- Dr. Burr Medico is in agreement to try increasing the xanax to maximum of 10 mg TID PRN. New prescription given to the patient.  Patient stated understanding of all instructions; and was in agreement with this plan of care. The patient knows to call the clinic with any problems, questions or concerns.   Thsi was a shared visit with Dr. Burr Medico today.   Total time spent with patient was 40 minutes;  with greater than 75 percent of that time spent in face to face counseling regarding patient's symptoms,  and coordination of care and follow up.  Disclaimer:This dictation was prepared with Dragon/digital dictation along with [employer]. Any transcriptional errors that result from this process are unintentional.  Drue Second, NP 07/20/2015   Addendum I have seen the patient, examined her. I agree with the assessment and and plan and have edited the notes.   From her recently outside CT chest, she appears to have increasing right pleura effusion, likely malignant along with pleural metastasis. Clinically she has right side pleuretic pain and worsening dyspnea, and worsening anxiety. After a lengthy discussion with pt, and his ex-husband on the phone, we decided to -  proceed with thoracentesis asap -try tramadol for her pain control  -increase xanax to 53m tid PRN for worsening anxiety, she will continue use ativan as needed for panic attack, and insomnia  -will discuss her treatment options again on her next follow up appointment with me in a few weeks. I briefly outlined the options, pt has recently seen a alternative medicine provider, and is trying diet change to control her cancer.   FTruitt Merle 07/19/2015

## 2015-07-20 NOTE — Assessment & Plan Note (Signed)
Patient has a long history of chronic anxiety.  She typically takes Xanax 0.5 mg 3 times a day on an as-needed basis; and also takes Ativan for severe anxiety attacks.  She has been out of the Xanax for the past week or so; and was prescribed Ativan 15 tablets.  During one of her recent emergency department visits.  She states that the Ativan is not helping her with her anxiety or sleep.  After long discussion with the patient- Dr. Burr Medico is in agreement to try increasing the xanax to maximum of 10 mg TID PRN. New prescription given to the patient.

## 2015-07-20 NOTE — Assessment & Plan Note (Signed)
Patient has a chronic right pleural effusion.  She reports increasing pain to the right rib area for the past several weeks.  She also continues with chronic fatigue; as well as shortness of breath with any exertion.  She denies any recent fevers or chills.  Patient CT scan, and of the chest/abdomen/pelvis.  A few weeks ago at wake portion of her stable [facility] for restaging purposes.  This CT revealed a large right pleural effusion; with multiple areas of pleural nodularity in the right hemothorax.  Since that time.-Patient has been to an emergency department in [place] still, [place] and one of the colon.  Emergency departments as well for further evaluation of the pleural effusion.  Patient states that all of the physician.  She has seen recently.  Have advised patient that her pleural effusion is not large enough to undergo a thoracentesis.  Exam today reveals breath sounds essentially clear bilaterally; with some diminished basis.  Patient does appear slightly short of breath with conversation.  O2 sat was 100% on room air.  Dr Burr Medico in to examine patient as well; and has agreed with plan to move forward with evaluation for thoracentesis.  Will schedule a diagnostic/therapeutic right thoracentesis for sometime this week.  Patient was advised to call/return or go directly to the emergency department for any worsening symptoms whatsoever.

## 2015-07-20 NOTE — Telephone Encounter (Signed)
Called pt back this pm & she sounded better but states that the xanax didn't help much.  Reminded to take some ativan & she states she forgot that she had some but took while on the phone with me.  Encouraged to relax & to go to ED if worse.

## 2015-07-20 NOTE — Telephone Encounter (Signed)
Returned call to patient re appointments. Spoke with patient re f/u with YF 4/6 and also confirmed paracentesis for 3/24 @ 10 am.

## 2015-07-20 NOTE — Assessment & Plan Note (Signed)
Patient states that she is expecting increasing pain to the right rib area.  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  She's also been diagnosed with a large right pleural effusion recently; is requesting consideration for thoracentesis.  Patient states that she is unable to take oxycodone; since it causes headaches, nausea, and dizziness.  She feels fairly certain she would be unable to tolerate hydrocodone as well.  After long discussion with patient; will try Ultram to see if this helps with patient's pain.

## 2015-07-20 NOTE — Telephone Encounter (Signed)
Received call from pt via Melissa/Scheduler & per Melissa, pt is crying & may have taken too much tramadol & something about lost meds. Talked with pt & she states "I don't know what's wrong with my voice"  Her voice sounds like she is crying & upset. She reports that she took tramadol last hs & it helped her pain.  She says that she is dizzy & unable to eat.  She also says that she found her xanax but hasn't taken yet. She mentioned that she has an appt for fri to get fluid taken off.  She states that she has a friend coming over.  She asked "how long to I have?" Informed pt that no one knows that & reminded pt to take some deep breaths & try to relax & go ahead & take her xanax & have friend stay with her.  Informed that I would call her later.  Message to Dr Burr Medico.

## 2015-07-20 NOTE — Telephone Encounter (Signed)
Thoracentesis scheduled for Friday March 25 at 10am. Pt has been advised.

## 2015-07-20 NOTE — Assessment & Plan Note (Signed)
Patient is status post bilateral mastectomy in the past.  She has undergone multiple treatment regimens in the past; with very poor tolerance of all.  She last received Faslodex in November 2016.  Dr. Burr Medico in to discuss all with patient; the patient once again is adamant that she continue with observation only at this point.  She states that she is researching multiple alternative therapies at this point; and is seeing a physician that is helping her with her diet and supplements.  Patient was advised that could change her mind at any time.  Will schedule a right thoracentesis for sometime this week.  Patient will need to return to the clinic for follow-up around 08/05/2015 for a visit only.  Patient knows to call in the interim with any new worries or concerns.

## 2015-07-22 ENCOUNTER — Ambulatory Visit: Payer: Self-pay | Admitting: Hematology

## 2015-07-23 ENCOUNTER — Ambulatory Visit (HOSPITAL_COMMUNITY)
Admission: RE | Admit: 2015-07-23 | Discharge: 2015-07-23 | Disposition: A | Discharge status: Home / Self Care | Payer: Medicaid Other | Source: Ambulatory Visit | Attending: Nurse Practitioner | Admitting: Nurse Practitioner

## 2015-07-23 ENCOUNTER — Ambulatory Visit (HOSPITAL_COMMUNITY)
Admission: RE | Admit: 2015-07-23 | Discharge: 2015-07-23 | Disposition: A | Discharge status: Home / Self Care | Payer: Medicaid Other | Source: Ambulatory Visit | Attending: General Surgery | Admitting: General Surgery

## 2015-07-23 DIAGNOSIS — Z9889 Other specified postprocedural states: Secondary | ICD-10-CM

## 2015-07-23 DIAGNOSIS — C7800 Secondary malignant neoplasm of unspecified lung: Secondary | ICD-10-CM | POA: Insufficient documentation

## 2015-07-23 DIAGNOSIS — C50919 Malignant neoplasm of unspecified site of unspecified female breast: Secondary | ICD-10-CM | POA: Diagnosis present

## 2015-07-23 DIAGNOSIS — J90 Pleural effusion, not elsewhere classified: Secondary | ICD-10-CM | POA: Insufficient documentation

## 2015-07-23 NOTE — Procedures (Signed)
Ultrasound-guided diagnostic and therapeutic right thoracentesis performed yielding 1.25liters of clear yellow colored fluid.  Fluid still remains, but procedure terminated secondary to pain. No immediate complications. Follow-up chest x-ray pending.      Patricia Carter E 10:49 AM 07/23/2015

## 2015-07-24 ENCOUNTER — Emergency Department (HOSPITAL_COMMUNITY)
Admission: EM | Admit: 2015-07-24 | Discharge: 2015-07-24 | Disposition: A | Discharge status: Home / Self Care | Payer: Medicaid Other | Attending: Emergency Medicine | Admitting: Emergency Medicine

## 2015-07-24 ENCOUNTER — Encounter (HOSPITAL_COMMUNITY): Payer: Self-pay | Admitting: Emergency Medicine

## 2015-07-24 ENCOUNTER — Emergency Department (HOSPITAL_COMMUNITY): Payer: Medicaid Other

## 2015-07-24 DIAGNOSIS — J90 Pleural effusion, not elsewhere classified: Secondary | ICD-10-CM | POA: Diagnosis not present

## 2015-07-24 DIAGNOSIS — Z853 Personal history of malignant neoplasm of breast: Secondary | ICD-10-CM | POA: Insufficient documentation

## 2015-07-24 DIAGNOSIS — Z79899 Other long term (current) drug therapy: Secondary | ICD-10-CM | POA: Insufficient documentation

## 2015-07-24 DIAGNOSIS — R0602 Shortness of breath: Secondary | ICD-10-CM | POA: Diagnosis present

## 2015-07-24 DIAGNOSIS — F419 Anxiety disorder, unspecified: Secondary | ICD-10-CM | POA: Diagnosis not present

## 2015-07-24 MED ORDER — TRAMADOL HCL 50 MG PO TABS
50.0000 mg | ORAL_TABLET | Freq: Once | ORAL | Status: AC
  Administered 2015-07-24: 50 mg via ORAL
  Filled 2015-07-24: qty 1

## 2015-07-24 MED ORDER — TRAMADOL HCL 50 MG PO TABS: 50.0000 mg | ORAL_TABLET | Freq: Four times a day (QID) | ORAL | Status: DC | PRN

## 2015-07-24 MED ORDER — TRAMADOL HCL 50 MG PO TABS: 50.0000 mg | ORAL_TABLET | Freq: Once | ORAL | Status: DC

## 2015-07-24 MED ORDER — LORAZEPAM 2 MG/ML IJ SOLN
1.0000 mg | Freq: Once | INTRAMUSCULAR | Status: AC
  Administered 2015-07-24: 1 mg via INTRAVENOUS
  Filled 2015-07-24: qty 1

## 2015-07-24 NOTE — ED Provider Notes (Signed)
CSN: [id number]     Arrival date & time 07/24/15  [id number] History   First MD Initiated Contact with Patient 07/24/15 [phone]     Chief Complaint  Patient presents with  . Shortness of Breath     (Consider location/radiation/quality/duration/timing/severity/associated sxs/prior Treatment) HPI   Patricia Carter is a 60 year old female with history of metastatic breast cancer, anxiety, depression, status post thoracentesis yesterday, she presents with chief complaint of shortness of breath and pain under her right armpit.  She states that her shortness of breath was briefly improved after her procedure however this morning she became more short of breath again. She is mostly concerned with a sensation on her right side and ribs that feels "like something is dripping" the right side of her chest.  She has chronic pain in her right armpit and side.  She currently rates the pain 7 out of 10, described as a constant ache in the right side of her chest, not worsened with these inspiration and palpation. She is on home pain medication, tramadol, which temporarily relieved her pain.  She denies cough, wheeze, abdominal pain, fever, chills, sweats.  She endorses anxiety about her condition and states that she was taking Ativan and now she is taking Xanax. She states that she is currently not treating her cancer with chemotherapy or radiation, and is doing comfort care. She states her daughter is in [place] and is unable to come today she is requesting that she is called and asked to come to the bedside.   No other acute complaints.  Past Medical History  Diagnosis Date  . Breast cancer ([place])   . Breast cancer ([place])   . Lung mass    Past Surgical History  Procedure Laterality Date  . Mastectomy Left     2010  . Eye surgery      cataract surgery, wears contact left eye  . Subxyphoid pericardial window N/A 05/05/2014    Procedure: SUBXYPHOID PERICARDIAL WINDOW;  Surgeon: Gaye Pollack, MD;  Location: [facility] OR;   Service: Thoracic;  Laterality: N/A;  . Flexible bronchoscopy N/A 05/05/2014    Procedure: FLEXIBLE BRONCHOSCOPY;  Surgeon: Gaye Pollack, MD;  Location: [facility] OR;  Service: Thoracic;  Laterality: N/A;  . Video bronchoscopy with endobronchial ultrasound N/A 05/05/2014    Procedure: VIDEO BRONCHOSCOPY WITH ENDOBRONCHIAL ULTRASOUND;  Surgeon: Gaye Pollack, MD;  Location: [facility] OR;  Service: Thoracic;  Laterality: N/A;   Family History  Problem Relation Age of Onset  . Cancer Paternal Aunt     leukemia  . Cancer Father 38    prostate cancer    Social History  Substance Use Topics  . Smoking status: Never Smoker   . Smokeless tobacco: Never Used  . Alcohol Use: 0.0 oz/week    0 Standard drinks or equivalent per week     Comment: occ   OB History    No data available     Review of Systems  Constitutional: Negative.   Respiratory: Positive for shortness of breath. Negative for apnea, cough, choking, chest tightness, wheezing and stridor.   Cardiovascular: Negative.  Negative for palpitations and leg swelling.  Gastrointestinal: Negative.   Musculoskeletal: Negative.   Psychiatric/Behavioral: Negative for suicidal ideas, sleep disturbance and self-injury. The patient is nervous/anxious.   All other systems reviewed and are negative.     Allergies  Iopamidol; Omeprazole; Oxycodone; Prednisone; and Zyrtec  Home Medications   Prior to Admission medications   Medication Sig Start Date End Date Taking?  Authorizing Provider  ALPRAZolam (XANAX) 0.5 MG tablet Take 1-2 tabs PO TID PRN anxiety. Patient taking differently: Take 0.5 mg by mouth 3 (three) times daily as needed for anxiety. Take 1-2 tabs PO TID PRN anxiety. 07/19/15  Yes Susanne Borders, NP  LORazepam (ATIVAN) 1 MG tablet Take 1 tablet (1 mg total) by mouth every 8 (eight) hours as needed (panic attacks anxiety related). 06/01/15  Yes Truitt Merle, MD  naproxen sodium (ANAPROX) 220 MG tablet Take 220 mg by mouth 2 (two) times daily as  needed (pain).   Yes Historical Provider, MD  ergocalciferol (DRISDOL) 8000 UNIT/ML drops Take 8,000 Units by mouth daily.    Historical Provider, MD  ibuprofen (ADVIL,MOTRIN) 800 MG tablet Take 1 tablet (800 mg total) by mouth 3 (three) times daily. Patient not taking: Reported on 07/24/2015 07/17/15   Courteney Lyn Mackuen, MD  traMADol (ULTRAM) 50 MG tablet Take 1 tablet (50 mg total) by mouth every 6 (six) hours as needed for severe pain. 07/24/15   Delsa Grana, PA-C   BP 119/77 mmHg  Pulse 100  Temp(Src) 97.9 F (36.6 C) (Oral)  Resp 27  SpO2 99% Physical Exam  Constitutional: She is oriented to person, place, and time. Vital signs are normal. She appears well-developed. She appears cachectic. She is cooperative.  Non-toxic appearance. She appears ill. She appears distressed.  Distress and tearful chronically ill-appearing female, cachectic  HENT:  Head: Normocephalic and atraumatic.  Nose: Nose normal.  Mouth/Throat: Oropharynx is clear and moist. No oropharyngeal exudate.  Eyes: Conjunctivae and EOM are normal. Pupils are equal, round, and reactive to light. Right eye exhibits no discharge. Left eye exhibits no discharge. No scleral icterus.  Neck: Normal range of motion. No JVD present. No tracheal deviation present. No thyromegaly present.  Cardiovascular: Normal rate, regular rhythm, normal heart sounds and intact distal pulses.  Exam reveals no gallop and no friction rub.   No murmur heard. Pulmonary/Chest: Effort normal. No accessory muscle usage. No tachypnea. No respiratory distress. She has decreased breath sounds in the right middle field and the right lower field. She has no wheezes. She has no rhonchi. She has no rales.   She exhibits no tenderness.    Abdominal: Soft. Bowel sounds are normal. She exhibits no distension and no mass. There is no tenderness. There is no rebound and no guarding.  Musculoskeletal: Normal range of motion. She exhibits no edema or tenderness.    Lymphadenopathy:    She has no cervical adenopathy.  Neurological: She is alert and oriented to person, place, and time. She has normal reflexes. No cranial nerve deficit. She exhibits normal muscle tone. Coordination normal.  Skin: Skin is warm and dry. No rash noted. She is not diaphoretic. No erythema. No pallor.  Psychiatric: Her speech is normal and behavior is normal. Judgment and thought content normal. Her mood appears anxious. Her affect is labile. Cognition and memory are normal. She expresses no homicidal and no suicidal ideation.  Nursing note and vitals reviewed.   ED Course  Procedures (including critical care time) Labs Review Labs Reviewed - No data to display  Imaging Review Dg Chest 2 View  07/24/2015  CLINICAL DATA:  60 year old female with a history of severe shortness of breath. Thoracentesis 07/23/2015 EXAM: CHEST - 2 VIEW COMPARISON:  07/23/2015, 07/17/2015, CT 06/12/2015 FINDINGS: Cardiomediastinal silhouette partially obscured by overlying lung and pleural disease. Obscuration of the right heart border. Dense opacity at the right lung base obscuring the right hemidiaphragm,  right heart border, and increasing from the comparison plain film. Pleural parenchymal thickening. Architectural distortion in the right hilum. Left lung relatively well aerated with chronic changes. IMPRESSION: Persisting right basilar opacity, compatible with pleural effusion and associated atelectasis. Compared to the prior chest x-ray, volume appears to be increasing again. Left lung relatively well aerated. Signed, Dulcy Fanny. Earleen Newport, DO [facility] Radiology Electronically Signed   By: Corrie Mckusick D.O.   On: 07/24/2015 10:00   I have personally reviewed and evaluated these images and lab results as part of my medical decision-making.   EKG Interpretation   Date/Time:  Saturday July 24 2015 09:02:13 EDT Ventricular Rate:  89 PR Interval:   121 QRS Duration: 93 QT Interval:  388 QTC Calculation: 472 R Axis:   85 Text Interpretation:  Sinus rhythm Probable left atrial enlargement  Borderline right axis deviation since last tracing no significant change  Confirmed by BELFI  MD, MELANIE ([id number]) on 07/24/2015 3:37:37 PM      MDM   Pt with breast cancer, mets to chest, known pleural effusion with associated pain, presents with Reported shortness of breath and anxiety.  Chest x-ray was repeated which shows Persistent right pleural effusion with mild increase compared to x-ray taken yesterday status post thoracentesis.  The patient has been anxious and intermittently tearful, she has repeatedly begged for her family members to be called and brought to the ER.  She was given 1 dose of Ativan and appeared more relaxed was able to sleep for short amount of time.  Overall the ER she has had stable vital signs although there are multiple recordings of tachypnea, which are within the patient is speaking with staff or with myself and begins to hyperventilate.  She has maintain oxygen saturation throughout her stay, greater than 95% SPO2. She was able to ambulate and maintain 100% oxygen saturation with mild increase in heart rate.    I did speak with Dr. Earlie Server, the colleague of her oncologist, Dr. Burr Medico, regarding her presentation today, he advises that with stable vital signs, patient will need to follow-up on Monday.    Pt was given a refill of pain medications, given metastatic cancer, history of pain, and she appears to be out of pain medications. She was looked up at the [facility] control substance database and appears to have plenty of anti-anxiety medication available.  She was encouraged to f/up with Dr. Burr Medico on Monday, and inquire about possible other palliative tx for pleural effusion and pain.  Final diagnoses:  Pleural effusion on right      Delsa Grana, PA-C 07/26/15 [place], MD 07/31/15 [phone]

## 2015-07-24 NOTE — ED Notes (Signed)
PA Lisa at the bedside.  

## 2015-07-24 NOTE — ED Notes (Signed)
Patient from home reports that yesterday she had fluid from her right lung, today she is experiencing shortness of breath and pain under her ribs on her right side.  She states she feels "like something is dripping" in the right side of her chest.

## 2015-07-24 NOTE — ED Notes (Signed)
Ambulated in hall on room air stats @100 % HR 114

## 2015-07-24 NOTE — Discharge Instructions (Signed)
Chronic Pain  Chronic pain can be defined as pain that is off and on and lasts for 3-6 months or longer. Many things cause chronic pain, which can make it difficult to make a diagnosis. There are many treatment options available for chronic pain. However, finding a treatment that works well for you may require trying various approaches until the right one is found. Many people benefit from a combination of two or more types of treatment to control their pain.  SYMPTOMS   Chronic pain can occur anywhere in the body and can range from mild to very severe. Some types of chronic pain include:  · Headache.  · Low back pain.  · Cancer pain.  · Arthritis pain.  · Neurogenic pain. This is pain resulting from damage to nerves.   People with chronic pain may also have other symptoms such as:  · Depression.  · Anger.  · Insomnia.  · Anxiety.  DIAGNOSIS   Your health care provider will help diagnose your condition over time. In many cases, the initial focus will be on excluding possible conditions that could be causing the pain. Depending on your symptoms, your health care provider may order tests to diagnose your condition. Some of these tests may include:   · Blood tests.    · CT scan.    · MRI.    · X-rays.    · Ultrasounds.    · Nerve conduction studies.    You may need to see a specialist.   TREATMENT   Finding treatment that works well may take time. You may be referred to a pain specialist. He or she may prescribe medicine or therapies, such as:   · Mindful meditation or yoga.  · Shots (injections) of numbing or pain-relieving medicines into the spine or area of pain.  · Local electrical stimulation.  · Acupuncture.    · Massage therapy.    · Aroma, color, light, or sound therapy.    · Biofeedback.    · Working with a physical therapist to keep from getting stiff.    · Regular, gentle exercise.    · Cognitive or behavioral therapy.    · Group support.    Sometimes, surgery may be recommended.   HOME CARE INSTRUCTIONS    · Take all medicines as directed by your health care provider.    · Lessen stress in your life by relaxing and doing things such as listening to calming music.    · Exercise or be active as directed by your health care provider.    · Eat a healthy diet and include things such as vegetables, fruits, fish, and lean meats in your diet.    · Keep all follow-up appointments with your health care provider.    · Attend a support group with others suffering from chronic pain.  SEEK MEDICAL CARE IF:   · Your pain gets worse.    · You develop a new pain that was not there before.    · You cannot tolerate medicines given to you by your health care provider.    · You have new symptoms since your last visit with your health care provider.    SEEK IMMEDIATE MEDICAL CARE IF:   · You feel weak.    · You have decreased sensation or numbness.    · You lose control of bowel or bladder function.    · Your pain suddenly gets much worse.    · You develop shaking.  · You develop chills.  · You develop confusion.  · You develop chest pain.  · You develop shortness of breath.    MAKE SURE YOU:  ·   Document Revised: 12/18/2012 Document Reviewed: 10/11/2012 Elsevier Interactive Patient Education 2016 Elsevier Inc[employer]  Pleural Effusion A pleural effusion is an abnormal buildup of fluid in the layers of tissue between your lungs and the inside of your chest (pleural space). These two layers of tissue that line both your lungs and the inside of your chest are called pleura. Usually, there is no air in the space between the pleura, only a thin layer of fluid. If left untreated, a large amount of fluid can build  up and cause the lung to collapse. A pleural effusion is usually caused by another disease that requires treatment. The two main types of pleural effusion are:  Transudative pleural effusion. This happens when fluid leaks into the pleural space because of a low protein count in your blood or high blood pressure in your vessels. Heart failure often causes this.  Exudative infusion. This occurs when fluid collects in the pleural space from blocked blood vessels or lymph vessels. Some lung diseases, injuries, and cancers can cause this type of effusion. CAUSES Pleural effusion can be caused by:  Heart failure.  A blood clot in the lung (pulmonary embolism).  Pneumonia.  Cancer.  Liver failure (cirrhosis).  Kidney disease.  Complications from surgery, such as from open heart surgery. SIGNS AND SYMPTOMS In some cases, pleural effusion may cause no symptoms. Symptoms can include:  Shortness of breath, especially when lying down.  Chest pain, often worse when taking a deep breath.  Fever.  Dry cough that is lasting (chronic).  Hiccups.  Rapid breathing. An underlying condition that is causing the pleural effusion (such as heart failure, pneumonia, blood clots, tuberculosis, or cancer) may also cause additional symptoms. DIAGNOSIS Your health care provider may suspect pleural effusion based on your symptoms and medical history. Your health care provider will also do a physical exam and a chest X-ray. If the X-ray shows there is fluid in your chest, you may need to have this fluid removed using a needle (thoracentesis) so it can be tested. You may also have:  Imaging studies of the chest, such as:  Ultrasound.  CT scan.  Blood tests for kidney and liver function. TREATMENT Treatment depends on the cause of the pleural effusion. Treatment may include:  Taking antibiotic medicines to clear up an infection that is causing the pleural effusion.  Placing a tube in the chest to  drain the effusion (tube thoracostomy). This procedure is often used when there is an infection in the fluid.  Surgery to remove the fibrous outer layer of tissue from the pleural space (decortication).  Thoracentesis, which can improve cough and shortness of breath.  A procedure to put medicine into the chest cavity to seal the pleural space to prevent fluid buildup (pleurodesis).  Chemotherapy and radiation therapy. These may be required in the case of cancerous (malignant) pleural effusion. HOME CARE INSTRUCTIONS  Take medicines only as directed by your health care provider.  Keep track of how long you can gently exercise before you get short of breath. Try simply walking at first.  Do not use any tobacco products, including cigarettes, chewing tobacco, or electronic cigarettes. If you need help quitting, ask your health care provider.  Keep all follow-up visits as directed by your health care provider. This is important. SEEK MEDICAL CARE IF:  The amount of time that you are able to exercise decreases or does not improve with time.  You have pain or signs of infection at the puncture site if you  had thoracentesis. Watch for:  Drainage.  Redness.  Swelling.  You have a fever. SEEK IMMEDIATE MEDICAL CARE IF:  You are short of breath.  You develop chest pain.  You develop a new cough. MAKE SURE YOU:  Understand these instructions.  Will watch your condition.  Will get help right away if you are not doing well or get worse.   This information is not intended to replace advice given to you by your health care provider. Make sure you discuss any questions you have with your health care provider.   Document Released: 04/17/2005 Document Revised: 05/08/2014 Document Reviewed: 09/10/2013 Elsevier Interactive Patient Education [employer].

## 2015-07-24 NOTE — ED Notes (Signed)
Pt encouraged to rest. Reassured with therapeutic communication. Lights turned out and patient made as comfortable as possible.

## 2015-07-26 ENCOUNTER — Telehealth: Payer: Self-pay | Admitting: Hematology

## 2015-07-26 ENCOUNTER — Telehealth: Payer: Self-pay | Admitting: *Deleted

## 2015-07-26 ENCOUNTER — Other Ambulatory Visit: Payer: Self-pay | Admitting: *Deleted

## 2015-07-26 ENCOUNTER — Telehealth: Payer: Self-pay

## 2015-07-26 NOTE — Telephone Encounter (Signed)
per pof to sch pt appt-cld & spoke to pt and gave pt time & date of appt °

## 2015-07-26 NOTE — Telephone Encounter (Signed)
per pof to sch pt appt-per THU pt aware

## 2015-07-26 NOTE — Telephone Encounter (Signed)
Received message from pt re:  Pt went to ER over weekend for increased shortness of breath.  Pt was instructed to call clinic for a follow up appt with Dr. Burr Medico for possible " drainage tube " insertion.  Spoke with pt and was informed that pt would like to see Dr. Burr Medico sooner than scheduled appts for 07/30/15.  Gave pt appt for 9 am for lab and office visit at 0930 am  07/27/15.  Pt to check with her ride; pt requested nurse to call back for confirmation of appts.

## 2015-07-26 NOTE — Telephone Encounter (Signed)
Patient called this morning wanting to update Dr. Burr Medico that she was in the ED on Saturday with SOB.  Msg sent to RN.

## 2015-07-27 ENCOUNTER — Inpatient Hospital Stay (HOSPITAL_COMMUNITY): Payer: Medicaid Other

## 2015-07-27 ENCOUNTER — Ambulatory Visit (HOSPITAL_BASED_OUTPATIENT_CLINIC_OR_DEPARTMENT_OTHER): Payer: Medicaid Other | Admitting: Hematology

## 2015-07-27 ENCOUNTER — Encounter: Payer: Self-pay | Admitting: Hematology

## 2015-07-27 ENCOUNTER — Other Ambulatory Visit: Payer: Medicaid Other

## 2015-07-27 ENCOUNTER — Encounter: Payer: Self-pay | Admitting: General Practice

## 2015-07-27 ENCOUNTER — Encounter (HOSPITAL_COMMUNITY): Payer: Self-pay | Admitting: Internal Medicine

## 2015-07-27 ENCOUNTER — Observation Stay (HOSPITAL_COMMUNITY)
Admission: AD | Admit: 2015-07-27 | Discharge: 2015-07-30 | Disposition: A | Discharge status: Home / Self Care | Payer: Medicaid Other | Source: Ambulatory Visit | Attending: Internal Medicine | Admitting: Internal Medicine

## 2015-07-27 VITALS — BP 115/77 | HR 100 | Resp 24

## 2015-07-27 DIAGNOSIS — G4700 Insomnia, unspecified: Secondary | ICD-10-CM | POA: Insufficient documentation

## 2015-07-27 DIAGNOSIS — R0781 Pleurodynia: Principal | ICD-10-CM | POA: Diagnosis present

## 2015-07-27 DIAGNOSIS — R5383 Other fatigue: Secondary | ICD-10-CM | POA: Diagnosis not present

## 2015-07-27 DIAGNOSIS — C50919 Malignant neoplasm of unspecified site of unspecified female breast: Secondary | ICD-10-CM | POA: Diagnosis present

## 2015-07-27 DIAGNOSIS — J90 Pleural effusion, not elsewhere classified: Secondary | ICD-10-CM | POA: Diagnosis not present

## 2015-07-27 DIAGNOSIS — R634 Abnormal weight loss: Secondary | ICD-10-CM | POA: Diagnosis not present

## 2015-07-27 DIAGNOSIS — G893 Neoplasm related pain (acute) (chronic): Secondary | ICD-10-CM | POA: Diagnosis not present

## 2015-07-27 DIAGNOSIS — C7800 Secondary malignant neoplasm of unspecified lung: Secondary | ICD-10-CM

## 2015-07-27 DIAGNOSIS — K5900 Constipation, unspecified: Secondary | ICD-10-CM | POA: Diagnosis not present

## 2015-07-27 DIAGNOSIS — R05 Cough: Secondary | ICD-10-CM | POA: Diagnosis not present

## 2015-07-27 DIAGNOSIS — C50911 Malignant neoplasm of unspecified site of right female breast: Secondary | ICD-10-CM

## 2015-07-27 DIAGNOSIS — F419 Anxiety disorder, unspecified: Secondary | ICD-10-CM | POA: Diagnosis present

## 2015-07-27 DIAGNOSIS — F4323 Adjustment disorder with mixed anxiety and depressed mood: Secondary | ICD-10-CM | POA: Diagnosis not present

## 2015-07-27 DIAGNOSIS — J910 Malignant pleural effusion: Secondary | ICD-10-CM | POA: Diagnosis present

## 2015-07-27 DIAGNOSIS — R0602 Shortness of breath: Secondary | ICD-10-CM | POA: Insufficient documentation

## 2015-07-27 DIAGNOSIS — Z515 Encounter for palliative care: Secondary | ICD-10-CM

## 2015-07-27 DIAGNOSIS — R110 Nausea: Secondary | ICD-10-CM | POA: Insufficient documentation

## 2015-07-27 DIAGNOSIS — Z9889 Other specified postprocedural states: Secondary | ICD-10-CM

## 2015-07-27 DIAGNOSIS — C771 Secondary and unspecified malignant neoplasm of intrathoracic lymph nodes: Secondary | ICD-10-CM

## 2015-07-27 HISTORY — DX: Other symptoms and signs involving emotional state: R45.89

## 2015-07-27 HISTORY — DX: Other specified anxiety disorders: F41.8

## 2015-07-27 LAB — GRAM STAIN

## 2015-07-27 LAB — CBC WITH DIFFERENTIAL/PLATELET
Basophils Absolute: 0 10*3/uL (ref 0.0–0.1)
Basophils Relative: 0 %
EOS ABS: 0 10*3/uL (ref 0.0–0.7)
EOS PCT: 0 %
HCT: 38.5 % (ref 36.0–46.0)
HEMOGLOBIN: 13.3 g/dL (ref 12.0–15.0)
LYMPHS ABS: 0.6 10*3/uL — AB (ref 0.7–4.0)
LYMPHS PCT: 15 %
MCH: 27.9 pg (ref 26.0–34.0)
MCHC: 34.5 g/dL (ref 30.0–36.0)
MCV: 80.9 fL (ref 78.0–100.0)
MONOS PCT: 12 %
Monocytes Absolute: 0.5 10*3/uL (ref 0.1–1.0)
NEUTROS PCT: 73 %
Neutro Abs: 2.9 10*3/uL (ref 1.7–7.7)
Platelets: 281 10*3/uL (ref 150–400)
RBC: 4.76 MIL/uL (ref 3.87–5.11)
RDW: 12.6 % (ref 11.5–15.5)
WBC: 4 10*3/uL (ref 4.0–10.5)

## 2015-07-27 LAB — COMPREHENSIVE METABOLIC PANEL
ALBUMIN: 3.5 g/dL (ref 3.5–5.0)
ALK PHOS: 62 U/L (ref 38–126)
ALT: 15 U/L (ref 14–54)
AST: 26 U/L (ref 15–41)
Anion gap: 10 (ref 5–15)
BILIRUBIN TOTAL: 0.7 mg/dL (ref 0.3–1.2)
BUN: 17 mg/dL (ref 6–20)
CALCIUM: 9.3 mg/dL (ref 8.9–10.3)
CO2: 27 mmol/L (ref 22–32)
Chloride: 97 mmol/L — ABNORMAL LOW (ref 101–111)
Creatinine, Ser: 0.67 mg/dL (ref 0.44–1.00)
GFR calc Af Amer: >60 mL/min (ref >60)
GFR calc non Af Amer: >60 mL/min (ref >60)
GLUCOSE: 92 mg/dL (ref 65–99)
Potassium: 3.8 mmol/L (ref 3.5–5.1)
Sodium: 134 mmol/L — ABNORMAL LOW (ref 135–145)
TOTAL PROTEIN: 6.4 g/dL — AB (ref 6.5–8.1)

## 2015-07-27 LAB — D-DIMER, QUANTITATIVE (NOT AT ARMC): D DIMER QUANT: 1.54 ug{FEU}/mL — AB (ref 0.00–0.50)

## 2015-07-27 LAB — MAGNESIUM: Magnesium: 1.9 mg/dL (ref 1.7–2.4)

## 2015-07-27 LAB — BODY FLUID CELL COUNT WITH DIFFERENTIAL
EOS FL: 0 %
LYMPHS FL: 54 %
MONOCYTE-MACROPHAGE-SEROUS FLUID: 46 % — AB (ref 50–90)
Neutrophil Count, Fluid: 0 % (ref 0–25)
WBC FLUID: 477 uL (ref 0–1000)

## 2015-07-27 LAB — PHOSPHORUS: Phosphorus: 2.7 mg/dL (ref 2.5–4.6)

## 2015-07-27 MED ORDER — ALPRAZOLAM 0.25 MG PO TABS
0.2500 mg | ORAL_TABLET | Freq: Once | ORAL | Status: AC
  Administered 2015-07-27: 0.25 mg via ORAL

## 2015-07-27 MED ORDER — SODIUM CHLORIDE 0.9% FLUSH
3.0000 mL | Freq: Two times a day (BID) | INTRAVENOUS | Status: DC
  Administered 2015-07-27 – 2015-07-30 (×6): 3 mL via INTRAVENOUS

## 2015-07-27 MED ORDER — HYDROCODONE-ACETAMINOPHEN 5-325 MG PO TABS
1.0000 | ORAL_TABLET | Freq: Once | ORAL | Status: AC
  Administered 2015-07-27: 1 via ORAL

## 2015-07-27 MED ORDER — ONDANSETRON HCL 4 MG PO TABS: 4.0000 mg | ORAL_TABLET | Freq: Four times a day (QID) | ORAL | Status: DC | PRN

## 2015-07-27 MED ORDER — TRAMADOL HCL 50 MG PO TABS
50.0000 mg | ORAL_TABLET | Freq: Four times a day (QID) | ORAL | Status: DC | PRN
  Administered 2015-07-27 (×2): 50 mg via ORAL
  Filled 2015-07-27 (×2): qty 1

## 2015-07-27 MED ORDER — ONDANSETRON HCL 4 MG/2ML IJ SOLN
4.0000 mg | Freq: Four times a day (QID) | INTRAMUSCULAR | Status: DC | PRN
  Administered 2015-07-30: 4 mg via INTRAVENOUS
  Filled 2015-07-27: qty 2

## 2015-07-27 MED ORDER — SODIUM CHLORIDE 0.9% FLUSH: 3.0000 mL | INTRAVENOUS | Status: DC | PRN

## 2015-07-27 MED ORDER — ACETAMINOPHEN 325 MG PO TABS: 650.0000 mg | ORAL_TABLET | Freq: Four times a day (QID) | ORAL | Status: DC | PRN

## 2015-07-27 MED ORDER — ERGOCALCIFEROL 8000 UNIT/ML PO SOLN
8000.0000 [IU] | Freq: Every day | ORAL | Status: DC
  Administered 2015-07-27 – 2015-07-30 (×4): 8000 [IU] via ORAL
  Filled 2015-07-27 (×4): qty 1

## 2015-07-27 MED ORDER — ACETAMINOPHEN 650 MG RE SUPP: 650.0000 mg | Freq: Four times a day (QID) | RECTAL | Status: DC | PRN

## 2015-07-27 MED ORDER — HYDROCODONE-ACETAMINOPHEN 5-325 MG PO TABS
ORAL_TABLET | ORAL | Status: AC
  Filled 2015-07-27: qty 1

## 2015-07-27 MED ORDER — POLYETHYLENE GLYCOL 3350 17 G PO PACK
17.0000 g | PACK | Freq: Every day | ORAL | Status: DC | PRN
  Administered 2015-07-29: 17 g via ORAL
  Filled 2015-07-27: qty 1

## 2015-07-27 MED ORDER — NAPROXEN 250 MG PO TABS
250.0000 mg | ORAL_TABLET | Freq: Two times a day (BID) | ORAL | Status: DC | PRN
  Administered 2015-07-27 – 2015-07-29 (×2): 250 mg via ORAL
  Filled 2015-07-27 (×4): qty 1

## 2015-07-27 MED ORDER — HYDROCODONE-ACETAMINOPHEN 5-325 MG PO TABS
1.0000 | ORAL_TABLET | Freq: Once | ORAL | Status: AC
Start: 2015-07-27 — End: 2015-07-27
  Administered 2015-07-27: 1 via ORAL
  Filled 2015-07-27: qty 1

## 2015-07-27 MED ORDER — SODIUM CHLORIDE 0.9 % IV SOLN: 250.0000 mL | INTRAVENOUS | Status: DC | PRN

## 2015-07-27 MED ORDER — ALPRAZOLAM 0.5 MG PO TABS
0.5000 mg | ORAL_TABLET | Freq: Three times a day (TID) | ORAL | Status: DC | PRN
  Administered 2015-07-27 – 2015-07-30 (×9): 0.5 mg via ORAL
  Filled 2015-07-27 (×10): qty 1

## 2015-07-27 NOTE — Progress Notes (Signed)
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 Deep surgical margin were positive, pT2N1, tumor size 3.7 x 3.0 x 2.2 cm, grade 3, 2 out of 10 lymph nodes were positive for tumor cells. Patient declined adjuvant chemotherapy, radiation and endocrin therapy   04/30/2014 Progression She presented with worsening cough and dyspnea. CT on 05/01/2015 showed right hilar mass with compression of the SVC and right upper lobe pulmonary artery and obstruction of the right middle lobe bronchus. Moderate pericardial effusion   05/05/2014 Pathology Results Endobronchial biopsy of right lower lobe showed poorly differentiated adenocarcinoma, estrogen receptor positive, CK AE1/AE3 positive. Negative for CK 7, CK 20, TTF-1, S100,etc.  HER2(-). Pericardium biopsy negative.   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 CT abdomen and pelvis is negative for mets.    08/17/2014 - 09/18/2014 Anti-estrogen oral therapy Letrozole 40m daily and palbociclib 1229mdaily 3 weeks on 1 week off, starting 08/18/2014, palbo was held for second cycle due to AEs (dizziness, fatigue and nose bleeding), Letrozole was held also due to AEs.    09/30/2014 - 10/19/2014 Anti-estrogen oral therapy She subsequently tried anastrozole and tamoxifen, had a severe side effects, such as dizziness, fatigue, mood swings, after the first few  doses, could not tolerate and stopped.   12/09/2014 Imaging CT chest abdomen and pelvis showed radiation changes in the right perihilar region was decreased in size of a right middle lobe mass, scatter right upper lobe pulmonary nodules, stable overall.   12/09/2014 Imaging Bone scan showed stable mild increased uptake in the lateral left fifth and a right sixth ribs, stable overall.   02/18/2015 - 03/05/2015 Anti-estrogen oral therapy She tried fulvestrant to 50 mg injection for 2 doses, could not tolerate and stopped.      HISTORY OF PRESENTING ILLNESS (07/14/2014):  Patricia Ehler60.o. female is here because of recently diagnosed metastatic recurrent breast cancer.  She was diagnosed with pT2 N1M0, stage IIB, left breast cancer in August 2011. She [facility], and declined adjuvant chemotherapy and endocrine therapy. She has not followed with physician regularly after that and does not have annual mammogram.  She started having dry cough and dyspnea on exertion in May 2016. She had multiple ED visit since then and was treated with multiple courses of antibiotics for bronchitis, which really did not help. In December 2015, she called the bowel pulmonary clinic and was evaluated for worsening cough and dyspnea. A CT of chest was obtained which showed a right hilar mass which cause compression on SVC and right upper lobe pulmonary artery and obstruction of right middle lobe. Moderate pericardial effusion was noticed. She underwent bronchoscopy and pericardial window on 05/01/2014. Biopsy of the right endobronchial lesion showed poorly differentiated adenoma, consistent with breast primary. Pericardial window biopsy and cytology was negative  for malignancy.  She started palliative radiation on 06/10/2014, and completed on 06/29/2014, a total of 14  sessions. She tolerated well initially, but developed severe substernal chest pain and dysphagia at the last week of radiation. She still has quite a bit chest pain, is taking Bumex and oxycodone, which helps only some. She is able to eat soft food diet and adequate. She lost about 7 pounds during the radiation. Her dyspnea and cough has significantly improved after radiation.  CURRENT TREATMENT: Supportive care  INTERIM HISTORY: She returns for an urgent visit. She underwent right thoracentesis by IR on March 24. A total of 1.2 L fluids were removed, cytology was negative. Her right chest pain got much worse after procedure, and she became more anxious, cannot sleep, was seen in the emergency room on March 25. She was found to have stable vital sign, was given pain medication and discharge. We called her yesterday for follow-up, she is in excruciating pain, crying, cannot sleep, and came in today to discuss management. She is accompanied by her ex-husband Patricia Carter to the clinic today.   MEDICAL HISTORY:  Past Medical History  Diagnosis Date  . Breast cancer ([place])   . Breast cancer ([place])   . Lung mass     SURGICAL HISTORY: Past Surgical History  Procedure Laterality Date  . Mastectomy Left     2010  . Eye surgery      cataract surgery, wears contact left eye  . Subxyphoid pericardial window N/A 05/05/2014    Procedure: SUBXYPHOID PERICARDIAL WINDOW;  Surgeon: Gaye Pollack, MD;  Location: [facility] OR;  Service: Thoracic;  Laterality: N/A;  . Flexible bronchoscopy N/A 05/05/2014    Procedure: FLEXIBLE BRONCHOSCOPY;  Surgeon: Gaye Pollack, MD;  Location: [place];  Service: Thoracic;  Laterality: N/A;  . Video bronchoscopy with endobronchial ultrasound N/A 05/05/2014    Procedure: VIDEO BRONCHOSCOPY WITH ENDOBRONCHIAL ULTRASOUND;  Surgeon: Gaye Pollack, MD;  Location: [place] OR;  Service: Thoracic;  Laterality: N/A;    SOCIAL HISTORY: Social History   Social History  . Marital Status:  divorced      Spouse Name: N/A  . Number of Children:  she has one daughter who lives in [place]   . Years of Education: N/A   Occupational History  . Works PT reception    Social History Main Topics  . Smoking status: Never Smoker   . Smokeless tobacco: Never Used  . Alcohol Use: 0.0 oz/week    0 Standard drinks or equivalent per week     Comment: occ  . Drug Use: No  . Sexual Activity: Not Currently   Other Topics Concern  . Not on file   Social History Narrative    FAMILY HISTORY: Family History  Problem Relation Age of Onset  . Cancer Paternal Aunt     leukemia  . Cancer Father 52    prostate cancer     ALLERGIES:  is allergic to iopamidol; omeprazole; oxycodone; prednisone; and zyrtec.  MEDICATIONS:  Current Outpatient Prescriptions  Medication Sig Dispense Refill  . ALPRAZolam (XANAX) 0.5 MG tablet Take 1-2 tabs PO TID PRN anxiety. (Patient taking differently: Take 0.5 mg by mouth 3 (three) times daily as needed for anxiety. Take 1-2 tabs PO TID PRN anxiety.) 90 tablet 0  . ergocalciferol (DRISDOL) 8000 UNIT/ML drops Take 8,000 Units by mouth daily.    Marland Kitchen ibuprofen (ADVIL,MOTRIN) 800 MG tablet Take 1 tablet (800 mg total) by mouth 3 (three) times daily. (Patient not  taking: Reported on 07/24/2015) 21 tablet 0  . LORazepam (ATIVAN) 1 MG tablet Take 1 tablet (1 mg total) by mouth every 8 (eight) hours as needed (panic attacks anxiety related). 45 tablet 0  . naproxen sodium (ANAPROX) 220 MG tablet Take 220 mg by mouth 2 (two) times daily as needed (pain).    . traMADol (ULTRAM) 50 MG tablet Take 1 tablet (50 mg total) by mouth every 6 (six) hours as needed for severe pain. 30 tablet 0   No current facility-administered medications for this visit.    REVIEW OF SYSTEMS:   Constitutional: Denies fevers, chills or abnormal night sweats. (+) fatigue  Eyes: Denies blurriness of vision, double vision or watery eyes Ears, nose, mouth, throat, and face: Denies mucositis or sore  throat Respiratory:cough and dyspnea has significantly improved after radiation, no wheezes Cardiovascular: (+) mid chest pain, Denies palpitation, or lower extremity swelling Gastrointestinal:  Denies nausea, heartburn or change in bowel habits Skin: Denies abnormal skin rashes Lymphatics: Denies new lymphadenopathy or easy bruising Neurological:Denies numbness, tingling or new weaknesses Behavioral/Psych: Mood is stable, no new changes  All other systems were reviewed with the patient and are negative.  PHYSICAL EXAMINATION: ECOG PERFORMANCE STATUS: 3  Filed Vitals:   07/27/15 0952  BP: 115/77  Pulse: 100  Resp: 24   GENERAL:alert, In acute distress, tachypnea, due to her pain and anxiety  SKIN: skin color, texture, turgor are normal, no rashes or significant lesions EYES: normal, conjunctiva are pink and non-injected, sclera clear OROPHARYNX:no exudate, no erythema and lips, buccal mucosa, and tongue normal  NECK: supple, thyroid normal size, non-tender, without nodularity LYMPH:  no palpable lymphadenopathy in the cervical, axillary or inguinal LUNGS: clear to auscultation and percussion with normal breathing effort on left, diminished breath sounds in the right lung base up to mid chest. HEART: regular rate & rhythm and no murmurs and no lower extremity edema ABDOMEN:abdomen soft, non-tender and normal bowel sounds Musculoskeletal:no cyanosis of digits and no clubbing  PSYCH: alert & oriented x 3 with fluent speech NEURO: no focal motor/sensory deficits  LABORATORY DATA:  I have reviewed the data as listed CBC Latest Ref Rng 07/19/2015 06/13/2015 06/12/2015  WBC 3.9 - 10.3 10e3/uL 4.9 4.1 4.7  Hemoglobin 11.6 - 15.9 g/dL 13.7 12.0 13.8  Hematocrit 34.8 - 46.6 % 41.6 37.4 40.9  Platelets 145 - 400 10e3/uL 290 226 249    CMP Latest Ref Rng 07/19/2015 06/13/2015 06/12/2015  Glucose 70 - 140 mg/dl 113 94 97  BUN 7.0 - 26.0 mg/dL 14._0 Creatinine 0.6 - 1.1 mg/dL 1.0 0.67  0.81  Sodium 136 - 145 mEq/L 137 138 143  Potassium 3.5 - 5.1 mEq/L 4.0 4.3 3.8  Chloride 101 - 111 mmol/L - 106 105  CO2 22 - 29 mEq/L _1 Calcium 8.4 - 10.4 mg/dL 10.0 8.6(L) 9.6  Total Protein 6.4 - 8.3 g/dL 7.5 - 6.9  Total Bilirubin 0.20 - 1.20 mg/dL 0.50 - 0.4  Alkaline Phos 40 - 150 U/L 72 - 78  AST 5 - 34 U/L 27 - 21  ALT 0 - 55 U/L 15 - 11(L)    PATHOLOGY REPORT: Diagnosis 05/05/2014 1. Pericardium, biopsy - BENIGN MESOTHELIAL LINED FIBROADIPOSE TISSUE. 2. Endobronchial biopsy, right middle lobe - POORLY DIFFERENTIATED CARCINOMA - SEE COMMENT. 3. Endobronchial biopsy, right lower lobe - POORLY DIFFERENTIATED CARCINOMA. - SEE COMMENT. 1 of 2 FINAL for ASHLEEN, DEMMA ([id number]) Microscopic Comment 2. , 3. The malignant cells  are positive for cytokeratin AE1/AE3 and estrogen receptor. They are negative for cytokeratin 7, cytokeratin 20, S-100, GCDFP, CD45, chromogranin, cytokeratin 5/6, synaptophysin, TTF-1. This immunohistochemical profile is not specific. However, given the patient's history, the positive staining for estrogen receptor is most suggestive of metastatic primary breast carcinoma. A HER-2 will be attempted and the results reportedly separately. (JBK:ecj 05/11/2014)  Results: HER-2/NEU BY CISH - NO AMPLIFICATION OF HER-2 DETECTED. RESULT RATIO OF HER2: CEP 17 SIGNALS 0.94 AVERAGE HER2 COPY NUMBER PER CELL 1.50  RADIOGRAPHIC STUDIES: I have personally reviewed the radiological images as listed and agreed with the findings in the report.  CT abdomen and pelvis 05/23/2015 IMPRESSION: Moderate in size right pleural effusion.  Peribronchovascular airspace disease in the right middle lobe, partially visualized, with associated anterior right subpleural linear thickening. Differential diagnosis includes pneumonia, postobstructive atelectasis or lymphangitic spread or metastatic disease. Radiation field changes are felt unlikely.  No CT evidence of  acute abnormalities within the abdomen or pelvis.  Persistent thickening of the duodenum. Nonspecific finding. This may represent a duodenitis. Correlation to upper endoscopy may be Considered.  Echo 01/19/2015 Study Conclusions  - Left ventricle: The cavity size was normal. Wall thickness was normal. Systolic function was normal. The estimated ejection fraction was in the range of 55% to 60%. Wall motion was normal; there were no regional wall motion abnormalities. -Pericardium: There was no pericardial effusion.  ASSESSMENT & PLAN:  59 year old female with past medical history of stage IIB left breast cancer, status post mastectomy and axillary lymph node dissection in 2011. Now presented with metastatic disease to lungs and lymph nodes.  1. 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 She never smoked, and immunostain does not support primary lung cancer. -We discussed the natural history of her cancer, unfortunately this is incurable at this stage, but treatable and we do have many treatment options.  -She has had good response to palliative radiation with significant symptom improvement. -Her recent CT chest showed a worsening pleural effusion, and the pleural metastasis, consistent with disease progression. She had thoracentesis done on 07/23/2015, cytology was negative.  -She has tried letrozole, anastrozole, tamoxifen and fulvestrant, but could not tolerate.  -We previously discussed the other treatment option, which would be chemotherapy. Giving her intolerance to antiestrogen therapy, I think her tolerance to chemotherapy would be quite low.  -Other option is Ibrance alone, which has 20-30% response rate. Due to her acute pain and distress, and a limited social support, I'll hold off  for now.  -Due to her psychological issue (anxiety and depression) and a very limited social support, her anticancer treatment option is very limited. -We again addressed the goal of care, which is palliative, and she agrees her pain control and quality life is her main concern. -We discussed hospice, which I think will give her her more social support and symptom management at home, she agrees to consider.   2. Right pleural effusion and pain  -She has intractable right side chest pain, likely related to her for metastasis and thoracentesis  -Due to her uncontrolled anxiety and pain, and a limited social support, I advised her to be admitted to hospital for pain control, and transition to hospice   3. Depression, anxiety and frequent panic attack -She will continue taking xanax and Ativan -I encourage her to continue follow-up with her  psychiatrist Ms Elisabeth Cara,  she only saw her twice   4. Code status -she is agreeable with DO NOT RESUSCITATE and DO NOT INTUBATE   Plan for today: -I give her 1 dose of hydrocodone-Tylenol 5/390m in my clinic  -Our chaplain LTheodis Shovein the clinic and had a long conversation with her  -[facility] for pain control  -We'll consult hospice when she is in the hospital.   All questions were answered. The patient knows to call the clinic with any problems, questions or concerns. I spent 30 minutes counseling the patient face to face. The total time spent in the appointment was 40 minutes and more than 50% was on counseling.     FTruitt Merle MD 07/27/2015

## 2015-07-27 NOTE — Progress Notes (Signed)
[facility] Accompanied by family member. Dr. Rockne Menghini has seen the patient. Patient is alert and oriented x3. Placed comfortably in bed, patient was anxious, was given Xanax and hydrocodone. Will continue to monitor.

## 2015-07-27 NOTE — Progress Notes (Signed)
1100  -  Pt stated relief somewhat of right upper back side pain with Hydrocodone.  Pt was taken to room  1322 via wheelchair  By nurse  with ex-husband by her side.

## 2015-07-27 NOTE — Progress Notes (Signed)
Patient status post thoracentesis right chest. Site clean and dry. Band-aid in tact. No bleeding or drainage noted.

## 2015-07-27 NOTE — Procedures (Signed)
Ultrasound-guided diagnostic and therapeutic right thoracentesis performed yielding 0.65liters of serosanguinous colored fluid. No immediate complications. Follow-up chest x-ray pending.  The procedure was stopped secondary to pain.  The patient still has a significant amount of fluid remaining.  Raiquan Chandler E 3:45 PM 07/27/2015

## 2015-07-27 NOTE — Progress Notes (Signed)
Spiritual Care Note  Referred by Dr Burr Medico and Meredeth Ide Le/RN for urgent spiritual and emotional support to help Patricia Carter calm her anxiety.  In her distress, Patricia Carter phoned her prayer partner named Yvetta Coder to request prayer and a f/u visit at [facility].  She was receptive to chaplain presence, requesting prayer and gradually calming and speaking reflectively.  Provided pastoral presence, prayer per request, and several tools for comfort and self-soothing: a prayer shawl as a tangible sign of love and support, a smooth wooden palm or "comfort" cross, and a handmade string of prayer beads.  Encouraged self-soothing techniques that ground a person in the present moment: slower deep breathing, feeling the earth support one's feet/bottom/back, using the palm cross as a "worry stone" for prayer and physical distraction/refocusing, and using the prayer beads for counting of blessings and rhythmic hand activity.   At the beginning of the pastoral encounter, Patricia Carter was upset and fearful about EOL and mention of hospice care, verbalizing concern for her daughter and other personal priorities.  Collaborating with her ex-husband, who was present for calming reassurance and loving support, we talked about how hospice doesn't mean immediate death, but support in living well--specifically managing pain so that a person can focus on what is most important to her.  Patricia Carter was receptive to pain management and focus on priorities.  Patricia Carter may be a helpful process to enable her to verbalize what quality means for her.  Please consult chaplain for calming assistance and conversation about spiritual needs if [facility] [place] meeting takes place.    Plan to f/u in person at [facility], and family aware of ongoing [place] and [facility] chaplain availability for support as desired.  Please page as needs arise.  Thank you.  [place], [place], [place] M-F daytime pager  [phone] Voicemail  [phone] [facility] 24/7 chaplain pager [phone]

## 2015-07-27 NOTE — H&P (Signed)
History and Physical:    Patricia Carter   [id number] DOB: 1956/01/09 DOA: 07/27/2015  Referring MD/provider: Dr. Burr Medico PCP: Gearlean Alf., PA-C   Chief Complaint: Right sided pleuritic pain  History of Present Illness:   Patricia Carter is an 60 y.o. female with a PMH of stage IIB, left breast cancer diagnosed in August 2011, Status post left mastectomy with reconstruction, implant and flap, subsequently declined adjuvant chemotherapy and endocrine therapy, lost to follow-up, who began to have dry cough and dyspnea 08/2014 with multiple ED visits treated with multiple courses of antibiotics for bronchitis. In 04/2015, she was evaluated by pulmonology and a CT of the chest was obtained which showed a right hilar mass with compression on the SVC and moderate pericardial effusion. She subsequently underwent bronchoscopy and pericardial window 05/01/14. 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 She subsequently started palliative radiation therapy on 06/10/14 and completed 14 treatments. She developed substernal chest pain and dysphasia from radiation treatments. She subsequently underwent a right thoracentesis on 07/23/15 with 1.2 L removed, cytology negative. She was seen by Dr. Burr Medico in the office today for an urgent visit. She reports that she has had worsening chest pain since the thoracentesis was done, and has had significant anxiety and inability to sleep. She is tearful and anxious when I interview her, complaining of right-sided chest and back pain, sharp in nature, partially relieved by low doses of hydrocodone. No diagnostic evaluation was undertaken prior to her being referred here as a direct admission.  ROS:   Review of Systems  Constitutional: Positive for weight loss and malaise/fatigue. Negative for fever and chills.  HENT: Negative for congestion and sore throat.   Eyes: Negative.   Respiratory: Positive for cough and shortness of breath.  Negative for sputum production.   Cardiovascular: Positive for chest pain and palpitations.  Gastrointestinal: Positive for nausea and constipation. Negative for vomiting, diarrhea, blood in stool and melena.  Genitourinary: Negative for dysuria.  Musculoskeletal: Positive for joint pain.  Skin: Negative.   Neurological: Positive for weakness. Negative for headaches.  Endo/Heme/Allergies: Negative.   Psychiatric/Behavioral: Positive for depression. The patient is nervous/anxious and has insomnia.      Past Medical History:   Past Medical History  Diagnosis Date  . Breast cancer ([place])   . Lung mass   . Anxiety about health     Past Surgical History:   Past Surgical History  Procedure Laterality Date  . Mastectomy Left     2010  . Eye surgery      cataract surgery, wears contact left eye  . Subxyphoid pericardial window N/A 05/05/2014    Procedure: SUBXYPHOID PERICARDIAL WINDOW;  Surgeon: Gaye Pollack, MD;  Location: [facility] OR;  Service: Thoracic;  Laterality: N/A;  . Flexible bronchoscopy N/A 05/05/2014    Procedure: FLEXIBLE BRONCHOSCOPY;  Surgeon: Gaye Pollack, MD;  Location: [facility];  Service: Thoracic;  Laterality: N/A;  . Video bronchoscopy with endobronchial ultrasound N/A 05/05/2014    Procedure: VIDEO BRONCHOSCOPY WITH ENDOBRONCHIAL ULTRASOUND;  Surgeon: Gaye Pollack, MD;  Location: [facility] OR;  Service: Thoracic;  Laterality: N/A;    Social History:   Social History   Social History  . Marital Status: Single    Spouse Name: N/A  . Number of Children: 1  . Years of Education: N/A   Occupational History  . Works PT reception    Social History Main Topics  . Smoking status: Never Smoker   . Smokeless  tobacco: Never Used  . Alcohol Use: 0.0 oz/week    0 Standard drinks or equivalent per week     Comment: occ  . Drug Use: No  . Sexual Activity: Not Currently   Other Topics Concern  . Not on file   Social History Narrative   Divorced.  Lives alone.    Family  history:   Family History  Problem Relation Age of Onset  . Cancer Paternal Aunt     leukemia  . Cancer Father 23    prostate cancer     Allergies   Iopamidol; Omeprazole; Oxycodone; Prednisone; and Zyrtec  Current Medications:   Prior to Admission medications   Medication Sig Start Date End Date Taking? Authorizing Provider  ALPRAZolam (XANAX) 0.5 MG tablet Take 1-2 tabs PO TID PRN anxiety. Patient taking differently: Take 0.5 mg by mouth 3 (three) times daily as needed for anxiety. Take 1-2 tabs PO TID PRN anxiety. 07/19/15   Susanne Borders, NP  ergocalciferol (DRISDOL) 8000 UNIT/ML drops Take 8,000 Units by mouth daily.    Historical Provider, MD  ibuprofen (ADVIL,MOTRIN) 800 MG tablet Take 1 tablet (800 mg total) by mouth 3 (three) times daily. Patient not taking: Reported on 07/24/2015 07/17/15   Courteney Lyn Mackuen, MD  LORazepam (ATIVAN) 1 MG tablet Take 1 tablet (1 mg total) by mouth every 8 (eight) hours as needed (panic attacks anxiety related). 06/01/15   Truitt Merle, MD  naproxen sodium (ANAPROX) 220 MG tablet Take 220 mg by mouth 2 (two) times daily as needed (pain).    Historical Provider, MD  traMADol (ULTRAM) 50 MG tablet Take 1 tablet (50 mg total) by mouth every 6 (six) hours as needed for severe pain. 07/24/15   Delsa Grana, PA-C    Physical Exam:   Filed Vitals:   07/27/15 1101 07/27/15 1322  BP: 97/62 97/65  Pulse: 93 84  Temp: 97.5 F (36.4 C) 98.3 F (36.8 C)  TempSrc: Oral Oral  Resp:  20  Height:  5\' 3"  (1.6 m)  Weight:  44.453 kg (98 lb)  SpO2: 100% 99%     Physical Exam: Blood pressure 97/65, pulse 84, temperature 98.3 F (36.8 C), temperature source Oral, resp. rate 20, height 5\' 3"  (1.6 m), weight 44.453 kg (98 lb), SpO2 99 %. Gen: Tearful, moderately distressed from pain and anxiety. Head: Normocephalic, atraumatic. Eyes: PERRL, EOMI, sclerae nonicteric. Mouth: Oropharynx is clear with moist mucous membranes. Neck: Supple, no thyromegaly,  no lymphadenopathy, no jugular venous distention. Chest: Lungs are clear to auscultation bilaterally. CV: Heart sounds are mildly tachycardic. No murmurs, rubs, or gallops. Abdomen: Soft, nontender, nondistended with normal active bowel sounds. Extremities: Extremities are without clubbing, edema, or cyanosis. Skin: Warm and dry. Neuro: Alert and oriented times 3; grossly nonfocal.  Psych: Mood and affect with depression and extreme anxiety, tearful.   Data Review:    Labs: Basic Metabolic Panel:  Recent Labs Lab 07/27/15 1212  NA 134*  K 3.8  CL 97*  CO2 27  GLUCOSE 92  BUN 17  CREATININE 0.67  CALCIUM 9.3  MG 1.9  PHOS 2.7   Liver Function Tests:  Recent Labs Lab 07/27/15 1212  AST 26  ALT 15  ALKPHOS 62  BILITOT 0.7  PROT 6.4*  ALBUMIN 3.5   No results for input(s): LIPASE, AMYLASE in the last 168 hours. No results for input(s): AMMONIA in the last 168 hours. CBC:  Recent Labs Lab 07/27/15 1212  WBC 4.0  NEUTROABS  2.9  HGB 13.3  HCT 38.5  MCV 80.9  PLT 281   Cardiac Enzymes: No results for input(s): CKTOTAL, CKMB, CKMBINDEX, TROPONINI in the last 168 hours.  BNP (last 3 results) No results for input(s): PROBNP in the last 8760 hours. CBG: No results for input(s): GLUCAP in the last 168 hours.  Radiographic Studies: Portable Chest 1 View  07/27/2015  CLINICAL DATA:  Right pleuritic chest pain. Patient has thoracentesis July 23, 2015 EXAM: PORTABLE CHEST 1 VIEW COMPARISON:  July 24, 2015 FINDINGS: The heart size and mediastinal contours are stable. There is a large right pleural effusion with consolidation of the right mid and lower lung. The left lung is hyperinflated. The visualized skeletal structures are stable. IMPRESSION: Large right pleural effusion with consolidation of the right mid and lower lung. Electronically Signed   By: Abelardo Diesel M.D.   On: 07/27/2015 12:55    EKG: Not ordered.   Assessment/Plan:   Principal Problem:    Pleural effusion with pleuritic type chest pain - Chest x-ray shows a large right pleural effusion with consolidation of the right mid and lower lung. - We'll need repeat thoracentesis for symptomatic management.  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 Burr Medico aware.  Recommends hospice care.    Anxiety - Continue Xanax.    Cancer associated pain - Tolerates hydrocodone, but is very sensitive to narcotic-based pain medications. - May need a palliative care consult if symptoms remain uncontrolled.    DVT prophylaxis - SCDs ordered.  Code Status / Family Communication / Disposition Plan:   Code Status: DO NOT RESUSCITATE Family Communication: Ex husband at the bedside. Disposition Plan: Home when symptoms controlled, likely 2 days.  Time spent: One hour.  Johnita Palleschi [facility] Pager [phone] Cell: [phone]   If 7PM-7AM, please contact night-coverage [email or web address] Password [facility] 07/27/2015, 2:07 PM

## 2015-07-27 NOTE — Progress Notes (Signed)
Chaplain paged to assist Patricia Carter deal with anxiety and panic.  Patricia Carter reports that physicians have told her that her cancer is stage four and there is no cure at this time. This has caused her to worry about her spiritual life, her trust in God and her feeling that she has not "lived up to her end of the bargain with God for God to take care of her. She reports that she is all right when her ex-husband or other friends are with her but she begins to panic when she is left alone with her thoughts. She fears death and fears not doing all she can to life with as much QoL as possible. 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 She is familiar with the aspects of training one's body from early stretching to advanced exercises. The chaplain, the context of training her spiritual side, guided Patricia Carter through a meditation/relaxation exercise. The goal of this exercise was to help Patricia Carter to release her anxiety and become more trusting of God and those placed in her path to assist her (medical staff, chaplains....). During this exercise Patricia Carter reported that her spiritual pain/distress lowered and she felt peace. This exercise, along with those introduced earlier by [facility], will be beneficial to her during those times with she feels the urge to panic and question her ability to cope. When the chaplain departed Patricia Carter reported that she felt she could go to sleep for the first time in a long time.  Patricia Carter also reports her great appreciation to her room nurse, Sharolyn Douglas, who sat with her and care for her while she was in the first flushes of her panic attack.  Recommend Chaplains - and other staff - continue to assist Patricia Carter with her new found spiritual exercises.  Page chaplain at any time should Patricia Carter need or request further spiritual care.   Sallee Lange. Harsha Yusko, DMin, MDiv Chaplain

## 2015-07-28 ENCOUNTER — Encounter (HOSPITAL_COMMUNITY): Payer: Self-pay | Admitting: *Deleted

## 2015-07-28 DIAGNOSIS — E46 Unspecified protein-calorie malnutrition: Secondary | ICD-10-CM

## 2015-07-28 DIAGNOSIS — J90 Pleural effusion, not elsewhere classified: Secondary | ICD-10-CM

## 2015-07-28 DIAGNOSIS — C799 Secondary malignant neoplasm of unspecified site: Secondary | ICD-10-CM

## 2015-07-28 DIAGNOSIS — F410 Panic disorder [episodic paroxysmal anxiety] without agoraphobia: Secondary | ICD-10-CM

## 2015-07-28 DIAGNOSIS — R0789 Other chest pain: Secondary | ICD-10-CM

## 2015-07-28 DIAGNOSIS — F329 Major depressive disorder, single episode, unspecified: Secondary | ICD-10-CM

## 2015-07-28 DIAGNOSIS — F419 Anxiety disorder, unspecified: Secondary | ICD-10-CM

## 2015-07-28 DIAGNOSIS — R0781 Pleurodynia: Secondary | ICD-10-CM

## 2015-07-28 DIAGNOSIS — C50919 Malignant neoplasm of unspecified site of unspecified female breast: Secondary | ICD-10-CM | POA: Diagnosis not present

## 2015-07-28 DIAGNOSIS — C50911 Malignant neoplasm of unspecified site of right female breast: Secondary | ICD-10-CM | POA: Diagnosis not present

## 2015-07-28 LAB — LACTATE DEHYDROGENASE, PLEURAL OR PERITONEAL FLUID: LD, Fluid: 1330 U/L — ABNORMAL HIGH (ref 3–23)

## 2015-07-28 MED ORDER — HYDROCODONE-ACETAMINOPHEN 5-325 MG PO TABS
1.0000 | ORAL_TABLET | ORAL | Status: AC | PRN
  Administered 2015-07-28 (×2): 1 via ORAL
  Filled 2015-07-28 (×2): qty 1

## 2015-07-28 MED ORDER — HYDROCODONE-ACETAMINOPHEN 5-325 MG PO TABS
1.0000 | ORAL_TABLET | Freq: Four times a day (QID) | ORAL | Status: DC | PRN
  Administered 2015-07-28 (×3): 1 via ORAL
  Administered 2015-07-29 (×2): 2 via ORAL
  Filled 2015-07-28: qty 1
  Filled 2015-07-28 (×4): qty 2

## 2015-07-28 MED ORDER — FLEET ENEMA 7-19 GM/118ML RE ENEM
1.0000 | ENEMA | Freq: Once | RECTAL | Status: AC
  Administered 2015-07-28: 1 via RECTAL
  Filled 2015-07-28: qty 1

## 2015-07-28 MED ORDER — ERGOCALCIFEROL 8000 UNIT/ML PO SOLN: 8000.0000 [IU] | Freq: Every day | ORAL | Status: DC

## 2015-07-28 MED ORDER — TRAMADOL HCL 50 MG PO TABS
50.0000 mg | ORAL_TABLET | Freq: Four times a day (QID) | ORAL | Status: DC | PRN
  Filled 2015-07-28: qty 1

## 2015-07-28 NOTE — Progress Notes (Signed)
Chaplain paged to assist Ms Patricia Carter with her quest for spiritual peace and balance.  Ms Patricia Carter reports she had a good night of sleep. She continues to use the spiritual exercises Chaplains Lundeen and Daelon Dunivan have taught her to use in times of stress and anxiety. She wished to have the chaplain come to guide her through these spiritual exercises during a time of physical pain and spiritual distress.  One key point discussed is the need to deal with physical pain to the levels needed to allow her to then strive for mental and spiritual balance. Studies indicate that reduction of distress in one area of body, mind and spirit, assists in relief of stress in the other areas. Ms Patricia Carter is finding this works well.  Ms Patricia Carter is a deeply religious and spiritual person. She views her life through these lens. She is reluctant to speak in these terms to avoid sounding preachy or self-centered, but her thought processes filter through her spirituality to find meaning and purpose in all she does.  Chaplains should continue to work with Ms Patricia Carter as she comes to terms with her mortality, her faith, and her ability to assist in bringing QoL to her situation. Still her high stressers are loneliness, abandonment fears, and feelings of isolation while she deals with her medical situation.  Follow-up chaplain visits are strongly recommended.  Patricia Carter. Patricia Carter, Dmin, MDiv Chaplain

## 2015-07-28 NOTE — Progress Notes (Signed)
Patricia Carter   DOB:09-04-1955   [id number]   [id number]  Subjective:  Patient is well-known to me. She was admitted from my office to [facility] yesterday for severe chest pain,  anxiety and depression.  She underwent a second right thoracentesis yesterday,  Procedure was stopped iIn the middle due to her pain.  According to patient and her nurse Romie Minus, he has been very emotional night and day for her since her admission, she has been crying most of time, [facility] spoke to her late last night twice,  And she was crying and a taxing her ex-husband to call my office when I saw her in her room.  Her pain did get some relief after taking hydrocodone yesterday, and she  Slept well last night.   Objective:  Filed Vitals:   07/28/15 0618 07/28/15 1345  BP: 111/75 114/79  Pulse: 81 111  Temp: 98.1 F (36.7 C) 98.4 F (36.9 C)  Resp: 22 20    Body mass index is 17.36 kg/(m^2).  Intake/Output Summary (Last 24 hours) at 07/28/15 1403 Last data filed at 07/28/15 [id number]  Gross per 24 hour  Intake    120 ml  Output      0 ml  Net    120 ml     In moderate distress due to her anxiety and pain  Sclerae unicteric  Oropharynx clear  No peripheral adenopathy  Lungs clear -- no rales or rhonchi,  Decreased breath sounds in the right lung base  Heart regular rate and rhythm  Abdomen benign  MSK no focal spinal tenderness, no peripheral edema  Neuro nonfocal   CBG (last 3)  No results for input(s): GLUCAP in the last 72 hours.   Labs:  Lab Results  Component Value Date   WBC 4.0 07/27/2015   HGB 13.3 07/27/2015   HCT 38.5 07/27/2015   MCV 80.9 07/27/2015   PLT 281 07/27/2015   NEUTROABS 2.9 07/27/2015    @LASTCHEMISTRY @  Urine Studies No results for input(s): UHGB, CRYS in the last 72 hours.  Invalid input(s): UACOL, UAPR, USPG, UPH, UTP, UGL, UKET, UBIL, UNIT, UROB, ULEU, UEPI, UWBC, URBC, UBAC, CAST, UCOM, BILUA  Basic Metabolic Panel:  Recent Labs Lab  07/27/15 1212  NA 134*  K 3.8  CL 97*  CO2 27  GLUCOSE 92  BUN 17  CREATININE 0.67  CALCIUM 9.3  MG 1.9  PHOS 2.7   GFR Estimated Creatinine Clearance: 51.9 mL/min (by C-G formula based on Cr of 0.67). Liver Function Tests:  Recent Labs Lab 07/27/15 1212  AST 26  ALT 15  ALKPHOS 62  BILITOT 0.7  PROT 6.4*  ALBUMIN 3.5   No results for input(s): LIPASE, AMYLASE in the last 168 hours. No results for input(s): AMMONIA in the last 168 hours. Coagulation profile No results for input(s): INR, PROTIME in the last 168 hours.  CBC:  Recent Labs Lab 07/27/15 1212  WBC 4.0  NEUTROABS 2.9  HGB 13.3  HCT 38.5  MCV 80.9  PLT 281   Cardiac Enzymes: No results for input(s): CKTOTAL, CKMB, CKMBINDEX, TROPONINI in the last 168 hours. BNP: Invalid input(s): POCBNP CBG: No results for input(s): GLUCAP in the last 168 hours. D-Dimer  Recent Labs  07/27/15 1212  DDIMER 1.54*   Hgb A1c No results for input(s): HGBA1C in the last 72 hours. Lipid Profile No results for input(s): CHOL, HDL, LDLCALC, TRIG, CHOLHDL, LDLDIRECT in the last 72 hours. Thyroid function studies No results for  input(s): TSH, T4TOTAL, T3FREE, THYROIDAB in the last 72 hours.  Invalid input(s): FREET3 Anemia work up No results for input(s): VITAMINB12, FOLATE, FERRITIN, TIBC, IRON, RETICCTPCT in the last 72 hours. Microbiology Recent Results (from the past 240 hour(s))  Culture, body fluid-bottle     Status: None (Preliminary result)   Collection Time: 07/27/15  3:17 PM  Result Value Ref Range Status   Specimen Description FLUID PLEURAL  Final   Special Requests BOTTLES DRAWN AEROBIC AND ANAEROBIC 5CC  Final   Culture   Final    NO GROWTH < 24 HOURS Performed at [facility]    Report Status PENDING  Incomplete  Gram stain     Status: None   Collection Time: 07/27/15  3:17 PM  Result Value Ref Range Status   Specimen Description FLUID PLEURAL  Final   Special Requests NONE  Final    Gram Stain   Final    MODERATE WBC PRESENT, PREDOMINANTLY MONONUCLEAR NO ORGANISMS SEEN Performed at [facility]    Report Status 07/27/2015 FINAL  Final      Studies:  Dg Chest 1 View  07/27/2015  CLINICAL DATA:  Status post right thoracentesis EXAM: CHEST 1 VIEW COMPARISON:  July 27, 2015 11:33 a.m. FINDINGS: There is persistent large right pleural effusion without significant interval change. Consolidation of the right mid and lower lung are identified. The left lung is clear. There is no pneumothorax. The osseous structures are stable. IMPRESSION: Large right pleural effusion unchanged compared prior exam. There is no pneumothorax. Electronically Signed   By: Abelardo Diesel M.D.   On: 07/27/2015 16:18   Portable Chest 1 View  07/27/2015  CLINICAL DATA:  Right pleuritic chest pain. Patient has thoracentesis July 23, 2015 EXAM: PORTABLE CHEST 1 VIEW COMPARISON:  July 24, 2015 FINDINGS: The heart size and mediastinal contours are stable. There is a large right pleural effusion with consolidation of the right mid and lower lung. The left lung is hyperinflated. The visualized skeletal structures are stable. IMPRESSION: Large right pleural effusion with consolidation of the right mid and lower lung. Electronically Signed   By: Abelardo Diesel M.D.   On: 07/27/2015 12:55   US Thoracentesis Asp Pleural Space W/img Guide  07/27/2015  INDICATION: History of breast cancer with evidence of metastatic lung disease. She has a recurrent right pleural effusion with chronic right pleuritic chest pain. Requested been made for diagnostic and therapeutic thoracentesis today. EXAM: ULTRASOUND GUIDED DIAGNOSTIC AND THERAPEUTIC THORACENTESIS MEDICATIONS: 1% lidocaine COMPLICATIONS: None immediate. PROCEDURE: An ultrasound guided thoracentesis was thoroughly discussed with the patient and questions answered. The benefits, risks, alternatives and complications were also discussed. The patient understands and  wishes to proceed with the procedure. Written consent was obtained. Ultrasound was performed to localize and mark an adequate pocket of fluid in the right chest. The area was then prepped and draped in the normal sterile fashion. 1% Lidocaine was used for local anesthesia. A Safe-T-Centesis catheter was introduced. Thoracentesis was performed. The catheter was removed and a dressing applied. FINDINGS: A total of approximately 0.65 L of serosanguineous fluid was removed. Samples were sent to the laboratory as requested by the clinical team. IMPRESSION: Successful ultrasound guided right thoracentesis yielding 0.65 L of pleural fluid. Once again her procedure was terminated at the patient's request secondary to chest pain. Read by: Saverio Danker, PA-C Electronically Signed   By: Markus Daft M.D.   On: 07/27/2015 15:53    Assessment: 60 y.o.  1.  Right chest pain, secondary to pleural metastasis 2.  Metastatic breast cancer,  Not on treatment 3.  Severe anxiety and depression and frequent panic attacks  4. Right pleural effusion  5.  malnutrition  Plan:  - she is not very somatic from her pleural effusion, but is stable, I did not recommend repeated thoracentesis during this hospital stay,  Any procedure would provoke her panic attacks and cause more pain afterwards - I have discussed the goal of care again with patient, she agrees with palliative care, focused on symptom management.  - she understands her cancer is not curable, and she has been declining quickly lately. -she is DNR/DNI -please consult hospice service, this has been discussed with pt and her ex-husband and both of them agreed  -I do no think she is a candidate for any anticancer treatment if her anxiety/panic attack/pain is not well controlled  -please change tramadol to vicodin, which she tolerated and responded well  -PT/OT evaluation for rehab if she is not eligible for inpt hospice  -she has very limited social support at home,  she is very scared to go home by herself.   I will follow up.    Truitt Merle, MD 07/28/2015  2:03 PM

## 2015-07-28 NOTE — Progress Notes (Signed)
Chaplain visit a part of rounding and follow up.  Ms Carbary was in a high level stress situation when the chaplain began his visit. Her crying was uncontroled and her anxiety was, by her report, 10 on a scale of 1 to 10 with 1 being total peace and 10 being the worst anxiety ever experienced. Among the stated stressers were her constipation and the time it took to get an enema delivered to be used, a heating pad she requested that was delayed, and the feeling that her spiritual pain was not being addressed. The enema was delivered later in the day, which was acknowledged by Ms Arlana Hove. Inquiry concerning the heating pad was dealt with by unit staff, which reduced Ms Mcalexander' stress level greatly. Nurses delivered anti-anxiety medications shortly after the chaplain arrived. The chaplain was then free to deal with the lingering spiritual pain (spiritual distress) concerning her unanswered prayers of God to reduce her physical pain and suffering. Conversation centered on God's presence in the medical staff's actions, and in being with her even in the deepest distress. Through active listening and calming speech, Ms Krieger began to drift toward sleep. Chaplain exited.  Page chaplain immediately if Ms Challa needs any level of spiritual or emotional care.  Thanks for the staff for assisting Ms Schmuhl with a heating pad for her back, their effort greatly reduced the stress she was feeling. Thanks also to Cabin crew for her supportive comments.  Sallee Lange. Iliana Hutt, DMin, MDiv Chaplain.

## 2015-07-28 NOTE — Progress Notes (Signed)
Pt had several small, hard stools post enema.

## 2015-07-28 NOTE — Progress Notes (Signed)
[facility] PROGRESS NOTE   Patricia Carter [id number] DOB: Jun 02, 1955 DOA: 07/27/2015 PCP: Gearlean Alf., PA-C  HPI/Subjective: Patient is very emotional, start to cry when she was talking about shortness of breath and pain. Status post right-sided paracentesis done on 07/27/2015.  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 - Right-sided thoracentesis done yesterday with removal of only 0.65 L of serosanguineous fluids. - Procedure terminated at patient request secondary to chest pain.  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 Burr Medico aware. Recommends hospice care.  Anxiety - Patient has severe anxiety, its likely the main driver of her symptoms now. - Patient gets very emotional she cries just thinking about the pain and shortness of breath.  Cancer associated pain -Tolerates hydrocodone, but is very sensitive to narcotic-based pain medications. -Will consult palliative care for further evaluation.  DVT prophylaxis - SCDs ordered.  Code Status: DNR Family Communication: Plan discussed with the patient. Disposition Plan: Remains inpatient Diet: Diet regular Room service appropriate?: Yes; Fluid consistency:: Thin  Consultants:  Hematology oncology  Procedures:  None  Antibiotics:  None   Objective: Filed Vitals:   07/27/15 2145 07/28/15 0618  BP: 108/64 111/75  Pulse: 82 81  Temp: 97.4 F (36.3 C) 98.1 F (36.7 C)  Resp: 18 22    Intake/Output Summary (Last 24 hours) at 07/28/15 1150 Last data filed at 07/28/15 [id number]  Gross per 24 hour  Intake    120 ml  Output      0 ml  Net    120 ml   Filed Weights   07/27/15  1322  Weight: 44.453 kg (98 lb)    Exam: General: Alert and awake, oriented x3, not in any acute distress. HEENT: anicteric sclera, pupils reactive to light and accommodation, EOMI CVS: S1-S2 clear, no murmur rubs or gallops Chest: clear to auscultation bilaterally, no wheezing, rales or rhonchi Abdomen: soft nontender, nondistended, normal bowel sounds, no organomegaly Extremities: no cyanosis, clubbing or edema noted bilaterally Neuro: Cranial nerves II-XII intact, no focal neurological deficits  Data Reviewed: Basic Metabolic Panel:  Recent Labs Lab 07/27/15 1212  NA 134*  K 3.8  CL 97*  CO2 27  GLUCOSE 92  BUN 17  CREATININE 0.67  CALCIUM 9.3  MG 1.9  PHOS 2.7   Liver Function Tests:  Recent Labs Lab 07/27/15 1212  AST 26  ALT 15  ALKPHOS 62  BILITOT 0.7  PROT 6.4*  ALBUMIN 3.5   No results for input(s): LIPASE, AMYLASE in the last 168 hours. No results for input(s): AMMONIA in the last 168 hours. CBC:  Recent Labs Lab 07/27/15 1212  WBC 4.0  NEUTROABS 2.9  HGB 13.3  HCT 38.5  MCV 80.9  PLT 281   Cardiac Enzymes: No results for input(s): CKTOTAL, CKMB, CKMBINDEX, TROPONINI in the last 168 hours. BNP (last 3 results)  Recent Labs  01/02/15 1349  BNP 28.5    ProBNP (last 3 results) No results for input(s): PROBNP in the last 8760 hours.  CBG: No results for input(s): GLUCAP in the last 168 hours.  Micro Recent Results (from the past 240 hour(s))  Gram stain     Status: None  Collection Time: 07/27/15  3:17 PM  Result Value Ref Range Status   Specimen Description FLUID PLEURAL  Final   Special Requests NONE  Final   Gram Stain   Final    MODERATE WBC PRESENT, PREDOMINANTLY MONONUCLEAR NO ORGANISMS SEEN Performed at [facility]    Report Status 07/27/2015 FINAL  Final     Studies: Dg Chest 1 View  07/27/2015  CLINICAL DATA:  Status post right thoracentesis EXAM: CHEST 1 VIEW COMPARISON:  July 27, 2015 11:33 a.m.  FINDINGS: There is persistent large right pleural effusion without significant interval change. Consolidation of the right mid and lower lung are identified. The left lung is clear. There is no pneumothorax. The osseous structures are stable. IMPRESSION: Large right pleural effusion unchanged compared prior exam. There is no pneumothorax. Electronically Signed   By: Abelardo Diesel M.D.   On: 07/27/2015 16:18   Portable Chest 1 View  07/27/2015  CLINICAL DATA:  Right pleuritic chest pain. Patient has thoracentesis July 23, 2015 EXAM: PORTABLE CHEST 1 VIEW COMPARISON:  July 24, 2015 FINDINGS: The heart size and mediastinal contours are stable. There is a large right pleural effusion with consolidation of the right mid and lower lung. The left lung is hyperinflated. The visualized skeletal structures are stable. IMPRESSION: Large right pleural effusion with consolidation of the right mid and lower lung. Electronically Signed   By: Abelardo Diesel M.D.   On: 07/27/2015 12:55   US Thoracentesis Asp Pleural Space W/img Guide  07/27/2015  INDICATION: History of breast cancer with evidence of metastatic lung disease. She has a recurrent right pleural effusion with chronic right pleuritic chest pain. Requested been made for diagnostic and therapeutic thoracentesis today. EXAM: ULTRASOUND GUIDED DIAGNOSTIC AND THERAPEUTIC THORACENTESIS MEDICATIONS: 1% lidocaine COMPLICATIONS: None immediate. PROCEDURE: An ultrasound guided thoracentesis was thoroughly discussed with the patient and questions answered. The benefits, risks, alternatives and complications were also discussed. The patient understands and wishes to proceed with the procedure. Written consent was obtained. Ultrasound was performed to localize and mark an adequate pocket of fluid in the right chest. The area was then prepped and draped in the normal sterile fashion. 1% Lidocaine was used for local anesthesia. A Safe-T-Centesis catheter was introduced.  Thoracentesis was performed. The catheter was removed and a dressing applied. FINDINGS: A total of approximately 0.65 L of serosanguineous fluid was removed. Samples were sent to the laboratory as requested by the clinical team. IMPRESSION: Successful ultrasound guided right thoracentesis yielding 0.65 L of pleural fluid. Once again her procedure was terminated at the patient's request secondary to chest pain. Read by: Saverio Danker, PA-C Electronically Signed   By: Markus Daft M.D.   On: 07/27/2015 15:53    Scheduled Meds: . ergocalciferol  8,000 Units Oral Daily  . sodium chloride flush  3 mL Intravenous Q12H   Continuous Infusions:      Time spent: 35 minutes    [place] A  [facility] Pager [phone] If 7PM-7AM, please contact night-coverage at [email or web address], password [facility] 07/28/2015, 11:50 AM  LOS: 1 day

## 2015-07-28 NOTE — Progress Notes (Signed)
Report from Romie Minus, [place].  Care assumed for pt at that time. Pt presently sitting up in bed, watching TV. Denies any specific c/o at present. Assessment unchanged from AM assessment. Will monitor.

## 2015-07-28 NOTE — Clinical Documentation Improvement (Signed)
Hospitalist  Please update your documentation within the medical record to reflect your response to this query. Thank you  Can the Nutrition eval with diagnosis be further specified/ linked with noted BMI?   Document Severity - Severe(third degree), Moderate (second degree), Mild (first degree)  Other condition  Unable to clinically determine  Document any associated diagnoses/conditions  Supporting Information: :  07/28/15 Nutrition eval..Marland Kitchen"Severe malnutrition in context of chronic illness, Underweight"...  See full eval for indicators (BMI), treatment recommendations  Please exercise your independent, professional judgment when responding. A specific answer is not anticipated or expected.  Thank You, Ermelinda Das, RN, BSN, [place] Certified Clinical Documentation Specialist : Health Information Management [phone]

## 2015-07-28 NOTE — Progress Notes (Signed)
Initial Nutrition Assessment  DOCUMENTATION CODES:   Severe malnutrition in context of chronic illness, Underweight  INTERVENTION:   - Provide patient with a morning and afternoon snacks. - Provided patient with a list of organic/naturally sweetened protein products.  - Encourage good PO intake of meals and snacks. - Will continue to monitor for nutrition needs.   NUTRITION DIAGNOSIS:   Malnutrition related to chronic illness as evidenced by severe depletion of body fat, severe depletion of muscle mass, energy intake < or equal to 75% for > or equal to 1 month, 8% weight loss x 3 months.  GOAL:   Patient will meet greater than or equal to 90% of their needs  MONITOR:   PO intake, Labs, Weight trends, I & O's  REASON FOR ASSESSMENT:   Other (Comment) (Low BMI)    ASSESSMENT:   60 y.o. female with a PMH of stage IIB, left breast cancer diagnosed in August 2011, s/p left mastectomy with reconstruction, implant and flap, subsequently declined adjuvant chemotherapy and endocrine therapy, lost to follow-up.  In 04/2015, she was evaluated by pulmonology and a CT of the chest was obtained which showed a right hilar mass with compression on the SVC and moderate pericardial effusion. She subsequently underwent bronchoscopy and pericardial window 05/01/14. 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 She subsequently started palliative radiation therapy on 06/10/14 and completed 14 treatments. She developed substernal chest pain and dysphasia from radiation treatments and underwent a right thoracentesis on 07/23/15 with 1.2 L removed, cytology negative.  Pt rreports worsening chest pain since the thoracentesis was done, and has had significant anxiety and inability to sleep.     Patient reports that she has not had much appetite for over a month now.  States that she will get up to go to the kitchen but once she is there she is too tired to even prepare  something to eat.  Patient states that she saw a holistic doctor who provided her a recommended meal plan and recipes to follow a more alkaline diet.  Reports that she has not been eating anything at home, but is hopeful that her ex-husband and some church members are going to help get to some help at a facility.  Pt states that she feel like fighting her disease this morning so she ate a boiled egg and about 1/2 a cup of fruit.  Patient was saving her bagel to eat later today.  Dietetic Intern encouraged patient to prioritize her protein foods and try to eat smaller meals throughout the day.  Will follow up with patient to provide list of organic/naturally sweetened protein options.  Patient reports that she has had some difficulty swallowing foods like breads and tough meats.  Patient states that as long as she chews her food well and eats slowly she is able to swallow.   Nutrition Focused Physical Exam was conducted.  Findings include severe fat depletion, severe muscle depletion, and no edema.  Patient reports a usual body weight of 107# but states she has lost weight, most recently weighing 98# at her last doctors appointment.  Per chart review, patient has lost 9# / 8% of body weight x 3 months, which is significant for the time frame.  Patient is not meeting needs currently or PTA.  Declines offers of supplements stating that she does not want to consume anything with artificial sweeteners.  Patient was amenable to snacks during the day.  Requested a chicken salad sandwich for a  mid-afternoon snack, dietetic intern to order.  Will continue to monitor for needs.  Medications reviewed.  Labs reviewed: sodium low (134).  Diet Order:  Diet regular Room service appropriate?: Yes; Fluid consistency:: Thin  Skin:  Reviewed, no issues  Last BM:  3/27  Height:   Ht Readings from Last 1 Encounters:  07/27/15 5\' 3"  (1.6 m)    Weight:   Wt Readings from Last 1 Encounters:  07/27/15 98 lb (44.453 kg)     Ideal Body Weight:  52.3 kg  BMI:  Body mass index is 17.36 kg/(m^2).  Estimated Nutritional Needs:   Kcal:  1400-1600  Protein:  60-70 grams  Fluid:  >/= 1.5 L  EDUCATION NEEDS:   No education needs identified at this time  Veronda Prude, Dietetic Intern Pager: [phone]

## 2015-07-28 NOTE — Progress Notes (Signed)
Two episodes of extreme anxiety today.  Patient requires intense 1:1 intervention with a caregiver to help refocus her.  Admits that she is afraid to be alone.  Appreciate chaplain support with this patient.

## 2015-07-29 ENCOUNTER — Encounter: Payer: Self-pay | Admitting: *Deleted

## 2015-07-29 ENCOUNTER — Encounter: Payer: Self-pay | Admitting: General Practice

## 2015-07-29 DIAGNOSIS — C50919 Malignant neoplasm of unspecified site of unspecified female breast: Secondary | ICD-10-CM | POA: Diagnosis not present

## 2015-07-29 DIAGNOSIS — F419 Anxiety disorder, unspecified: Secondary | ICD-10-CM | POA: Diagnosis not present

## 2015-07-29 DIAGNOSIS — J90 Pleural effusion, not elsewhere classified: Secondary | ICD-10-CM | POA: Diagnosis not present

## 2015-07-29 DIAGNOSIS — R0781 Pleurodynia: Secondary | ICD-10-CM | POA: Diagnosis not present

## 2015-07-29 DIAGNOSIS — C50911 Malignant neoplasm of unspecified site of right female breast: Secondary | ICD-10-CM | POA: Diagnosis not present

## 2015-07-29 MED ORDER — TUBERCULIN PPD 5 UNIT/0.1ML ID SOLN
5.0000 [IU] | Freq: Once | INTRADERMAL | Status: DC
  Administered 2015-07-29: 5 [IU] via INTRADERMAL
  Filled 2015-07-29: qty 0.1

## 2015-07-29 MED ORDER — HYDROCODONE-ACETAMINOPHEN 5-325 MG PO TABS
1.0000 | ORAL_TABLET | ORAL | Status: DC | PRN
  Administered 2015-07-29 – 2015-07-30 (×4): 2 via ORAL
  Filled 2015-07-29 (×6): qty 2

## 2015-07-29 NOTE — Consult Note (Signed)
Consultation Note Date: 07/29/2015   Patient Name: Patricia Carter  DOB: 1955-06-21  MRN: [id number]  Age / Sex: 60 y.o., female  PCP: Gearlean Alf, PA-C Referring Physician: Verlee Monte, MD  Reason for Consultation: Establishing goals of care, Non pain symptom management, Pain control and Psychosocial/spiritual support    Clinical Assessment/Narrative:   60 y.o. female with a PMH of stage IIB, left breast cancer diagnosed in August 2011, Status post left mastectomy with reconstruction, implant and flap, subsequently declined adjuvant chemotherapy and endocrine therapy, lost to follow-up, who began to have dry cough and dyspnea 08/2014 with multiple ED visits treated with multiple courses of antibiotics for bronchitis.  In 04/2015, she was evaluated by pulmonology and a CT of the chest was obtained which showed a right hilar mass with compression on the SVC and moderate pericardial effusion.  She subsequently underwent bronchoscopy and pericardial window 05/01/14. 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   She subsequently started palliative radiation therapy on 06/10/14 and completed 14 treatments. She developed substernal chest pain and dysphasia from radiation treatments. She subsequently underwent a right thoracentesis on 07/23/15 with 1.2 L removed, cytology negative.   She was seen by Dr. Burr Medico in the office today for an urgent visit. She reports that she has had worsening chest pain since the thoracentesis was done, and has had significant anxiety and inability to sleep.   Patient is faced with treatment option decisions (patietn reports intolerance of all tried chemotherapy) , advanced directive decisions and urgent psycho-social issues specific to anticipatory care .  She lives alone and has no family that can help her, no friend support.  Her ex husband is her main support  and he live 3 hrs away    This NP Wadie Lessen reviewed medical records, received report from team, assessed the patient and then meet at the patient's bedside and then by telephone with her ex-husband Caoilinn Vertucci # [phone]  to discuss diagnosis prognosis, GOC, EOL wishes disposition and options.  Most of today's conversation was to provide active listening, and create space for patient to verbalize her thoughts, feelings and fears of living with a terminal diagnosis.  Emotional support offered  Values and goals of care important to patient were attempted to be elicited.   Concept of Hospice and Palliative Care were discussed  Questions and concerns addressed.     PMT will continue to support holistically.    HCPOA: yes    SUMMARY OF RECOMMENDATIONS  -patient wishes to f/u with Dr Burr Medico as out patient in cancer center ( she is still considering Pleu- rex cath) -re-meet tomorrow with husband [name] to discussed possible disposition options and advanced directives  Code Status/Advance Care Planning:  DNR      Code Status Orders        Start     Ordered   07/27/15 1123  Do not attempt resuscitation (DNR)   Continuous    Question Answer Comment  In the event of cardiac or respiratory ARREST Do not call a "code blue"   In the event of cardiac or respiratory ARREST Do not perform Intubation, CPR, defibrillation or ACLS   In the event of cardiac or respiratory ARREST Use medication by any route, position, wound care, and other measures to relive pain and suffering. May use oxygen, suction and manual treatment of airway obstruction as needed for comfort.      07/27/15 1124    Code Status History    Date  Active Date Inactive Code Status Order ID Comments User Context   06/12/2015 12:06 PM 06/14/2015  3:54 PM Full Code [id number]  Bonnielee Haff, MD Inpatient   05/05/2014  3:47 PM 05/09/2014  6:51 PM Full Code [id number]  Gaye Pollack, MD Inpatient   05/04/2014  3:22 PM 05/05/2014  3:47 PM  Full Code [id number]  Tanda Rockers, MD Inpatient    Advance Directive Documentation        Most Recent Value   Type of Advance Directive  Living will, Out of facility DNR (pink MOST or yellow form)   Pre-existing out of facility DNR order (yellow form or pink MOST form)     "MOST" Form in Place?        Symptom Management:  Anxiety- discussed possible medications that I believe would be a great help in treating her  anxiety but Brandan tells me she cannot take them, "they make me feel worse".  Pain- continue Vicodin as previously prescibed    Palliative Prophylaxis:    Frequent Pain Assessment and Oral Care   Psycho-social/Spiritual:  Support System: Poor Desire for further Chaplaincy support:yes Additional Recommendations: Education on Hospice  Prognosis: Unable to determine  Discharge Planning: Pending outcomes, SW looking at [facility]   Chief Complaint/ Primary Diagnoses: Present on Admission:  . Metastatic breast cancer ([place]) . Pleuritic chest pain . Anxiety . 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) . Pleural effusion . Cancer associated pain  I have reviewed the medical record, interviewed the patient and family, and examined the patient. The following aspects are pertinent.  Past Medical History  Diagnosis Date  . Breast cancer ([place])   . Lung mass   . Anxiety about health    Social History   Social History  . Marital Status: Single    Spouse Name: N/A  . Number of Children: 1  . Years of Education: N/A   Occupational History  . Works PT reception    Social History Main Topics  . Smoking status: Never Smoker   . Smokeless tobacco: Never Used  . Alcohol Use: 0.0 oz/week    0 Standard drinks or equivalent per week     Comment: occ  . Drug Use: No  . Sexual Activity: Not Currently   Other Topics Concern  . None   Social History Narrative   Divorced.  Lives alone.   Family History  Problem Relation Age of Onset  . Cancer Paternal Aunt      leukemia  . Cancer Father 3    prostate cancer    Scheduled Meds: . ergocalciferol  8,000 Units Oral Daily  . sodium chloride flush  3 mL Intravenous Q12H   Continuous Infusions:  PRN Meds:.sodium chloride, acetaminophen **OR** acetaminophen, ALPRAZolam, HYDROcodone-acetaminophen, naproxen, ondansetron **OR** ondansetron (ZOFRAN) IV, polyethylene glycol, sodium chloride flush Medications Prior to Admission:  Prior to Admission medications   Medication Sig Start Date End Date Taking? Authorizing Provider  ALPRAZolam (XANAX) 0.5 MG tablet Take 1-2 tabs PO TID PRN anxiety. Patient taking differently: Take 0.5 mg by mouth 3 (three) times daily as needed for anxiety. Take 1-2 tabs PO TID PRN anxiety. 07/19/15  Yes Susanne Borders, NP  ergocalciferol (DRISDOL) 8000 UNIT/ML drops Take 8,000 Units by mouth daily.   Yes Historical Provider, MD  LORazepam (ATIVAN) 1 MG tablet Take 1 tablet (1 mg total) by mouth every 8 (eight) hours as needed (panic attacks anxiety related). 06/01/15  Yes Truitt Merle, MD  naproxen sodium (ANAPROX) 220 MG  tablet Take 220 mg by mouth 2 (two) times daily as needed (pain).   Yes Historical Provider, MD  traMADol (ULTRAM) 50 MG tablet Take 1 tablet (50 mg total) by mouth every 6 (six) hours as needed for severe pain. 07/24/15  Yes Delsa Grana, PA-C   Allergies  Allergen Reactions  . Iopamidol Hives    Uncoded Allergy. Allergen: DIZIPROMINE, Other Reaction: OTHER REACTION  . Omeprazole Other (See Comments)    Abdominal cramps and constipation   . Oxycodone Other (See Comments)    Migraines and dizziness   . Prednisone Palpitations  . Zyrtec [Cetirizine] Palpitations    Review of Systems  Constitutional: Positive for activity change, appetite change and fatigue.  Neurological: Positive for weakness.  Psychiatric/Behavioral:       - axniety    Physical Exam  Constitutional: She appears cachectic. She appears ill.  Cardiovascular: Normal rate, regular rhythm and  normal heart sounds.   Respiratory: She has decreased breath sounds in the left upper field and the left lower field.  Psychiatric: Her mood appears anxious. She exhibits a depressed mood.    Vital Signs: BP 111/77 mmHg  Pulse 100  Temp(Src) 97.8 F (36.6 C) (Oral)  Resp 20  Ht 5\' 3"  (1.6 m)  Wt 44.453 kg (98 lb)  BMI 17.36 kg/m2  SpO2 98%  SpO2: SpO2: 98 % O2 Device:SpO2: 98 % O2 Flow Rate: .   IO: Intake/output summary:  Intake/Output Summary (Last 24 hours) at 07/29/15 0919 Last data filed at 07/29/15 0655  Gross per 24 hour  Intake    240 ml  Output    300 ml  Net    -60 ml    LBM: Last BM Date: 07/28/15 Baseline Weight: Weight: 44.453 kg (98 lb) Most recent weight: Weight: 44.453 kg (98 lb)      Palliative Assessment/Data:    Additional Data Reviewed:  CBC:    Component Value Date/Time   WBC 4.0 07/27/2015 1212   WBC 4.9 07/19/2015 1239   HGB 13.3 07/27/2015 1212   HGB 13.7 07/19/2015 1239   HCT 38.5 07/27/2015 1212   HCT 41.6 07/19/2015 1239   PLT 281 07/27/2015 1212   PLT 290 07/19/2015 1239   MCV 80.9 07/27/2015 1212   MCV 84.4 07/19/2015 1239   NEUTROABS 2.9 07/27/2015 1212   NEUTROABS 3.6 07/19/2015 1239   LYMPHSABS 0.6* 07/27/2015 1212   LYMPHSABS 0.5* 07/19/2015 1239   MONOABS 0.5 07/27/2015 1212   MONOABS 0.8 07/19/2015 1239   EOSABS 0.0 07/27/2015 1212   EOSABS 0.0 07/19/2015 1239   BASOSABS 0.0 07/27/2015 1212   BASOSABS 0.0 07/19/2015 1239   Comprehensive Metabolic Panel:    Component Value Date/Time   NA 134* 07/27/2015 1212   NA 137 07/19/2015 1239   K 3.8 07/27/2015 1212   K 4.0 07/19/2015 1239   CL 97* 07/27/2015 1212   CO2 27 07/27/2015 1212   CO2 28 07/19/2015 1239   BUN 17 07/27/2015 1212   BUN 14.1 07/19/2015 1239   CREATININE 0.67 07/27/2015 1212   CREATININE 1.0 07/19/2015 1239   GLUCOSE 92 07/27/2015 1212   GLUCOSE 113 07/19/2015 1239   CALCIUM 9.3 07/27/2015 1212   CALCIUM 10.0 07/19/2015 1239   AST 26  07/27/2015 1212   AST 27 07/19/2015 1239   ALT 15 07/27/2015 1212   ALT 15 07/19/2015 1239   ALKPHOS 62 07/27/2015 1212   ALKPHOS 72 07/19/2015 1239   BILITOT 0.7 07/27/2015 1212   BILITOT 0.50  07/19/2015 1239   PROT 6.4* 07/27/2015 1212   PROT 7.5 07/19/2015 1239   ALBUMIN 3.5 07/27/2015 1212   ALBUMIN 4.0 07/19/2015 1239    Discussed with Dr Hartford Poli   Time In: 0830 Time Out: 1000 Time Total: 90 min Greater than 50%  of this time was spent counseling and coordinating care related to the above assessment and plan.  Signed by: Wadie Lessen, NP  Knox Royalty, NP  07/29/2015, 9:19 AM  Please contact Palliative Medicine Team phone at [phone] for questions and concerns.

## 2015-07-29 NOTE — Progress Notes (Signed)
PT Cancellation Note  Patient Details Name: Patricia Carter MRN: [id number] DOB: Jun 22, 1955   Cancelled Treatment:    Reason Eval/Treat Not Completed: Fatigue/lethargy limiting ability to participate Per RN, pt just received pain meds which makes her tired.  Requested to allow pt to rest and check back in the morning.   Marley Charlot,KATHrine E 07/29/2015, 3:48 PM Carmelia Bake, PT, DPT 07/29/2015 Pager: [phone]

## 2015-07-29 NOTE — Clinical Social Work Note (Signed)
Clinical Social Work Assessment  Patient Details  Name: Patricia Carter MRN: [id number] Date of Birth: [birth year]  Date of referral:  07/29/15               Reason for consult:  Discharge Planning                Permission sought to share information with:  Family Supports Permission granted to share information::  Yes, Verbal Permission Granted  Name::     Patricia Carter  Agency::     Relationship::  ex husband  Contact Information:  [phone]  Housing/Transportation Living arrangements for the past 2 months:  Single Family Home Source of Information:  Patient, Other (Comment Required) (ex husband) Patient Interpreter Needed:  None Criminal Activity/Legal Involvement Pertinent to Current Situation/Hospitalization:  No - Comment as needed Significant Relationships:  Adult Children, Other Family Members Lives with:  Self Do you feel safe going back to the place where you live?   (pt willing to go to ALF, but pt and pt ex husband aware that pt may have to return home with support before pt able to transition into ALF) Need for family participation in patient care:  Yes (Comment) (pt very anxious and agreeable to ex husband, [name] being involved in disposition conversation)  Care giving concerns:  Pt admitted from home alone. Per report, pt experiences anxiety and debilitating panic attacks. Medical team concerned about pt lack of support at home and inquired with CSW about placement in ALF.    Social Worker assessment / plan:    CSW received referral to complete psychosocial assessment and assist with exploring option of ALF placement with hospice care.   CSW spoke with [facility] CSW regarding pt as [place] CSW is familiar with pt from outpatient cancer center. [place] reports that [facility] CSW team and [place] have made several attempts to assist pt in the past with her anxiety and debilitating panic attacks in the outpt setting. [facility] Support Team has attempted to arrange CNA supports, home  health and outpt mental health supports to help address pt's anxiety concerns. Pt has shared she had made appointments with mental health providers in the past. Pt met with LCSW at [facility] in December of 2016, but it appears pt did not follow through on additional appointments. CSW team has had limited effectiveness in interventions as pt has not returned phone calls or followed through with additional community support resource options. When [facility] CSW team had followed up with pt, she stated she was fine and not in need of support at that time. Per [facility] CSW, PCS referral has been made in the past, but pt did not qualify as pt was able to complete ADLs at that time.  CSW received notification from PMT NP, Wadie Lessen that pt was agreeable to exploring ALF. CSW completed FL2 and initiated ALF search in [facility].   CSW and RNCM met with pt at bedside to discuss disposition needs. Pt tearful when entering room and expressed being overwhelmed. Pt shared that she is willing to speak with CSW and RNCM, but states that she had just received medication that is making her tired. Pt stated that her family lives in the [place] area and that she is hopeful to eventually get into a facility closer to them. CSW discussed that it will be important for pt family to explore facilities in that area for ALF as CSW does not have contacts in that area. CSW discussed that CSW initiated referral  to local ALF facilities, but unable to guarantee that pt will have a Medicaid ALF bed upon discharge and may have to transition home with support while continue to pursue placement. Pt became more anxious and contacted her ex husband on her telephone for [facility] and CSW to discuss. Updated pt ex husband on above information and discussed that anticipation is for discharge tomorrow and search has been initiated for ALF, but at this time CSW does not have a confirmed bed offer. CSW discussed that [facility] is reviewing the  information, but have not yet responded. RNCM discussed need for plan for home as ALF may not be available upon discharge and home health or hospice agency can continue process from outpatient setting. Private duty care agencies discussed and pt ex husband expressed understanding and states that preference would be for pt to go into ALF from hospital, but pt ex husband will ensure pt will have the care she needs at home if pt discharges home until being able to transition into an ALF. Pt ex husband plans to come to hospital tomorrow morning to have further discussions about plan and can transport pt at discharge.   CSW to follow up with [facility] re: referral in AM in order to assist with disposition needs. Pt and pt husband expressed understanding that pt could not remain in the hospital for ALF placement and appreciative that process has been started, but remain hopeful that pt may be able to go straight to ALf.   CSW to continue to follow to provide support and assist with disposition needs as appropriate.   Employment status:  Disabled (Comment on whether or not currently receiving Disability) Insurance information:  Medicaid [place] PT Recommendations:  Not assessed at this time Information / Referral to community resources:  Other (Comment Required) (Referral to [place])  Patient/Family's Response to care:  Pt alert and oriented x 4. Pt displayed anxiousness and tearfulness throughout visit. Pt ex husband appears to be a strong support at this time and reports that pt daughter is a support, but unfortunately they both live in the [place] area. Pt ex husband states that he will ensure pt is cared for at home if pt cannot admit directly to an ALF.   Patient/Family's Understanding of and Emotional Response to Diagnosis, Current Treatment, and Prognosis:  Pt and pt ex husband displayed understanding surrounding diagnosis and prognosis.   Emotional  Assessment Appearance:  Appears stated age Attitude/Demeanor/Rapport:  Crying Affect (typically observed):  Anxious Orientation:  Oriented to Self, Oriented to Place, Oriented to  Time, Oriented to Situation Alcohol / Substance use:  Not Applicable Psych involvement (Current and /or in the community):  No (Comment)  Discharge Needs  Concerns to be addressed:  Discharge Planning Concerns Readmission within the last 30 days:  No Current discharge risk:  Lack of support system, Lives alone Barriers to Discharge:      Ladell Pier, LCSW 07/29/2015, 4:52 PM  [phone]

## 2015-07-29 NOTE — Progress Notes (Signed)
[place] Work  [place] Clinical Social Work was referred by [employer] CSW for collaboration on case. [place] CSW team and [place] have made several attempts to assist pt in the past with her anxiety and debilitating panic attacks in the outpt setting. [facility] Support Team has attempted to arrange CNA supports, home health and outpt mental health supports to help address pt's anxiety concerns.  Pt has shared she had made appointments with mental health providers in the past. Pt met with LCSW at [facility] in December of 2016, but it appears pt did not follow through on additional appointments. CSW team has had limited effectiveness in interventions as pt has not returned phone calls or followed through with additional community support resource options. When CSW team had followed up with pt, she stated she was fine and not in need of support.   Pt has opted to be in observation status of her cancer and has been out of treatment for several months. This Clinical Social Worker feels possible psych consult may be helpful to further address anxiety while inpatient. [facility] may be helpful as pt transitions back to the community as an additional layer of support. CSW had made referral to [facility] through medicaid in the past, but this failed as pt was able to meet most ADLs at that time. Referral to PCS could be considered again as an option if pt does indeed need assistance with ADLs. This CSW is unaware of possible option to completely address concerns of "being alone at home". There are no community resources that will address this need.  Additional layers of community support are possibilities to lessen this issue.   Clinical Social Work interventions: Case collaboration  Loren Racer, [place] Worker [place]  [place] Phone: [phone] Fax: [phone]

## 2015-07-29 NOTE — Progress Notes (Signed)
   07/29/15 1400  Clinical Encounter Type  Visited With Patient  Visit Type Initial;Psychological support;Spiritual support  Referral From Nurse  Consult/Referral To Chaplain  Spiritual Encounters  Spiritual Needs Emotional;Prayer;Other (Comment) (Pastoral Conversation/Support)  Stress Factors  Patient Stress Factors Exhausted;Family relationships;Health changes;Lack of knowledge;Loss   I just met the patient who was tearful, but willing to talk. She was looking forward to finding a place in the [place] area so that she could be closer to her family. The patient was in a bit of pain and asked for her pain medication. I did not see any sign of her having difficulties in making decisions or being uncooperative. She understands her diagnosis and prognosis. Ms. Cinco did state that medicines tend to make her sick. The staff were under the impression that she "just doesn't like" taking a lot of medications; but she told me that they make her dizzy.  The patient stated that she is currently exhausted and wanted to rest. I think that she does need follow up care over the afternoon and night; but I would check with the nursing staff to see if Ms. Campo is resting. Ms. Tatsch stated that she hasn't been able to rest, because people keep coming in to check on her. She appreciates support.    [place] M.Div.

## 2015-07-29 NOTE — Progress Notes (Signed)
Chaplain paged to assist Patricia Carter with her spiritual and emotional situation.  Patricia Carter is coming to terms with her medical situation. She was looking forward to a Palliative Consult to better help her plan her future. - Palliative Care came during this visit.  Patricia Carter is concerned about the cycle of her life at present. She feels her life has been reduced to taking medications, going to sleep and awaking to take more medications in order to be put to sleep again. Most often she asks the chaplain just to sit with her. She remains very frightened about being alone and being forgotten. She reports that she got a great deal of rest last night, but was saddened that her dreams were about her healthy days when she was fit enough to do what she wished, only to awaken to find her body incapable of most everything she once took for granted. She still is dealing with her relationship with God and trying to understand why someone so fit as she was, is now confined to a bed in intolerable pain. Meditation exercises are abandoned for requests for medications.  Patricia Carter is reaching out to family to provide presence and comfort, but she concedes that may not bring what she wishes, continual presence.  Daytime chaplain follow-up and care is needed. Patricia Carter desires a fresh spiritual care perspective to add to those given by the night time chaplain.  Sallee Lange. Florian Chauca, [place]

## 2015-07-29 NOTE — Progress Notes (Signed)
[facility] PROGRESS NOTE   Patricia Carter [id number] DOB: [birth year] DOA: 07/27/2015 PCP: Worthy Keeler C., PA-C  HPI/Subjective: Appears to be better than yesterday, seen by palliative, recommendation to follow. Chest pain is better controlled with Norco, still has shortness of breath.  Assessment/Plan: Principal Problem:   Pleural effusion Active Problems:   Breast cancer metastasized to lung [facility])   Anxiety   Cancer associated pain   Metastatic breast cancer (HCC)   Pleuritic chest pain   Pleural effusion with pleuritic type chest pain - Chest x-ray shows a large right pleural effusion with consolidation of the right mid and lower lung. - Right-sided thoracentesis done yesterday with removal of only 0.65 L of serosanguineous fluids (showed only reactive cells) - Procedure terminated at patient request secondary to chest pain. - Pleural effusion is likely malignant effusion, FNAC and brushing from bronchoscopy from 05/05/2014 showed malignant cells.  Breast cancer metastasized to lung [facility]) - Dr. Burr Medico aware. Recommends hospice care.  Anxiety - Patient has severe anxiety, its likely the main driver of her symptoms now. Started on Xanax. - Patient gets very emotional she cries just thinking about the pain and shortness of breath.  Cancer associated pain -Tolerates hydrocodone, but is very sensitive to narcotic-based pain medications. -Will consult palliative care for further evaluation.  DVT prophylaxis - SCDs ordered.  Code Status: DNR Family Communication: Plan discussed with the patient. Disposition Plan: Remains inpatient Diet: Diet regular Room service appropriate?: Yes; Fluid consistency:: Thin  Consultants:  Hematology oncology.  Palliative medicine team.  Procedures:  None  Antibiotics:  None   Objective: Filed Vitals:   07/29/15 0651 07/29/15 1010  BP: 111/77 111/63  Pulse: 100 98  Temp: 97.8 F (36.6 C) 98.3 F (36.8 C)  Resp: 20  18    Intake/Output Summary (Last 24 hours) at 07/29/15 1201 Last data filed at 07/29/15 1000  Gross per 24 hour  Intake    363 ml  Output    300 ml  Net     63 ml   Filed Weights   07/27/15 1322  Weight: 44.453 kg (98 lb)    Exam: General: Alert and awake, oriented x3, not in any acute distress. HEENT: anicteric sclera, pupils reactive to light and accommodation, EOMI CVS: S1-S2 clear, no murmur rubs or gallops Chest: clear to auscultation bilaterally, no wheezing, rales or rhonchi Abdomen: soft nontender, nondistended, normal bowel sounds, no organomegaly Extremities: no cyanosis, clubbing or edema noted bilaterally Neuro: Cranial nerves II-XII intact, no focal neurological deficits  Data Reviewed: Basic Metabolic Panel:  Recent Labs Lab 07/27/15 1212  NA 134*  K 3.8  CL 97*  CO2 27  GLUCOSE 92  BUN 17  CREATININE 0.67  CALCIUM 9.3  MG 1.9  PHOS 2.7   Liver Function Tests:  Recent Labs Lab 07/27/15 1212  AST 26  ALT 15  ALKPHOS 62  BILITOT 0.7  PROT 6.4*  ALBUMIN 3.5   No results for input(s): LIPASE, AMYLASE in the last 168 hours. No results for input(s): AMMONIA in the last 168 hours. CBC:  Recent Labs Lab 07/27/15 1212  WBC 4.0  NEUTROABS 2.9  HGB 13.3  HCT 38.5  MCV 80.9  PLT 281   Cardiac Enzymes: No results for input(s): CKTOTAL, CKMB, CKMBINDEX, TROPONINI in the last 168 hours. BNP (last 3 results)  Recent Labs  01/02/15 1349  BNP 28.5    ProBNP (last 3 results) No results for input(s): PROBNP in the last 8760 hours.  CBG: No  results for input(s): GLUCAP in the last 168 hours.  Micro Recent Results (from the past 240 hour(s))  Culture, body fluid-bottle     Status: None (Preliminary result)   Collection Time: 07/27/15  3:17 PM  Result Value Ref Range Status   Specimen Description FLUID PLEURAL  Final   Special Requests BOTTLES DRAWN AEROBIC AND ANAEROBIC 5CC  Final   Culture   Final    NO GROWTH < 24 HOURS Performed  at [facility]    Report Status PENDING  Incomplete  Gram stain     Status: None   Collection Time: 07/27/15  3:17 PM  Result Value Ref Range Status   Specimen Description FLUID PLEURAL  Final   Special Requests NONE  Final   Gram Stain   Final    MODERATE WBC PRESENT, PREDOMINANTLY MONONUCLEAR NO ORGANISMS SEEN Performed at [facility]    Report Status 07/27/2015 FINAL  Final     Studies: Dg Chest 1 View  07/27/2015  CLINICAL DATA:  Status post right thoracentesis EXAM: CHEST 1 VIEW COMPARISON:  July 27, 2015 11:33 a.m. FINDINGS: There is persistent large right pleural effusion without significant interval change. Consolidation of the right mid and lower lung are identified. The left lung is clear. There is no pneumothorax. The osseous structures are stable. IMPRESSION: Large right pleural effusion unchanged compared prior exam. There is no pneumothorax. Electronically Signed   By: Abelardo Diesel M.D.   On: 07/27/2015 16:18   US Thoracentesis Asp Pleural Space W/img Guide  07/27/2015  INDICATION: History of breast cancer with evidence of metastatic lung disease. She has a recurrent right pleural effusion with chronic right pleuritic chest pain. Requested been made for diagnostic and therapeutic thoracentesis today. EXAM: ULTRASOUND GUIDED DIAGNOSTIC AND THERAPEUTIC THORACENTESIS MEDICATIONS: 1% lidocaine COMPLICATIONS: None immediate. PROCEDURE: An ultrasound guided thoracentesis was thoroughly discussed with the patient and questions answered. The benefits, risks, alternatives and complications were also discussed. The patient understands and wishes to proceed with the procedure. Written consent was obtained. Ultrasound was performed to localize and mark an adequate pocket of fluid in the right chest. The area was then prepped and draped in the normal sterile fashion. 1% Lidocaine was used for local anesthesia. A Safe-T-Centesis catheter was introduced. Thoracentesis was  performed. The catheter was removed and a dressing applied. FINDINGS: A total of approximately 0.65 L of serosanguineous fluid was removed. Samples were sent to the laboratory as requested by the clinical team. IMPRESSION: Successful ultrasound guided right thoracentesis yielding 0.65 L of pleural fluid. Once again her procedure was terminated at the patient's request secondary to chest pain. Read by: Saverio Danker, PA-C Electronically Signed   By: Markus Daft M.D.   On: 07/27/2015 15:53    Scheduled Meds: . ergocalciferol  8,000 Units Oral Daily  . sodium chloride flush  3 mL Intravenous Q12H   Continuous Infusions:      Time spent: 35 minutes    [facility]  [facility] Pager [phone] If 7PM-7AM, please contact night-coverage at [email or web address], password [facility] 07/29/2015, 12:01 PM  LOS: 2 days

## 2015-07-29 NOTE — Progress Notes (Signed)
Spiritual Care Note  Following from a distance while consulting with support team:  [facility] chaplains Charlie Lumpkin/DMin and [place] Varner/MDiv, Grier Hock/LCSW [facility]).  Thankful for Palliative Care consult/support.  Encourage adding layers of support for anxiety and pain management, such as home-based palliative or hospice support, in order to free Ms Menta to focus on emotional needs and EOL concerns.  Will continue to follow, but please also page as needs arise.  Thank you.  Chaplain Lorrin Jackson, [place], [place] daytime pager [phone] Voicemail  [phone]

## 2015-07-29 NOTE — Clinical Social Work Placement (Signed)
   CLINICAL SOCIAL WORK PLACEMENT  NOTE  Date:  07/29/2015  Patient Details  Name: Patricia Carter MRN: [id number] Date of Birth: [birth year]  Clinical Social Work is seeking post-discharge placement for this patient at the [place] level of care (*CSW will initial, date and re-position this form in  chart as items are completed):  Yes   Patient/family provided with [place] Work Department's list of facilities offering this level of care within the geographic area requested by the patient (or if unable, by the patient's family).  Yes   Patient/family informed of their freedom to choose among providers that offer the needed level of care, that participate in Medicare, Medicaid or managed care program needed by the patient, have an available bed and are willing to accept the patient.  Yes   Patient/family informed of [facility]'s ownership interest in [facility] and [facility], as well as of the fact that they are under no obligation to receive care at these facilities.  PASRR submitted to EDS on 07/29/15     PASRR number received on 07/29/15     Existing PASRR number confirmed on       FL2 transmitted to all facilities in geographic area requested by pt/family on 07/29/15     FL2 transmitted to all facilities within larger geographic area on       Patient informed that his/her managed care company has contracts with or will negotiate with certain facilities, including the following:            Patient/family informed of bed offers received.  Patient chooses bed at       Physician recommends and patient chooses bed at      Patient to be transferred to   on  .  Patient to be transferred to facility by       Patient family notified on   of transfer.  Name of family member notified:        PHYSICIAN Please sign FL2, Please sign DNR     Additional Comment:    _______________________________________________ Ladell Pier,  LCSW 07/29/2015, 5:09 PM

## 2015-07-29 NOTE — NC FL2 (Signed)
[place] LEVEL OF CARE SCREENING TOOL     IDENTIFICATION  Patient Name: Patricia Carter Birthdate: 1955-10-20 Sex: female Admission Date (Current Location): 07/27/2015  [facility] and [place] Number:  Herbalist and Address:  [facility],  [place] [address] [place]      Provider Number: [id number]  Attending Physician Name and Address:  Verlee Monte, MD  Relative Name and Phone Number:       Current Level of Care: Hospital Recommended Level of Care: [place] Prior Approval Number:    Date Approved/Denied:   PASRR Number:    Discharge Plan: Other (Comment) ([place])    Current Diagnoses: Patient Active Problem List   Diagnosis Date Noted  . Metastatic breast cancer ([place]) 07/27/2015  . Pleuritic chest pain 07/27/2015  . Cancer associated pain 07/20/2015  . Back pain 06/13/2015  . Pleural effusion 06/12/2015  . Upper abdominal pain 06/12/2015  . Adjustment disorder with mixed anxiety and depressed mood 04/13/2015  . Neoplastic malignant related fatigue 08/27/2014  . Anxiety 08/27/2014  . Breast cancer metastasized to lung ([place]) 05/21/2014  . Acute pericardial effusion 05/04/2014  . Lung mass 04/30/2014    Orientation RESPIRATION BLADDER Height & Weight     Self, Time, Situation, Place  Normal Continent Weight: 98 lb (44.453 kg) Height:  5\' 3"  (160 cm)  BEHAVIORAL SYMPTOMS/MOOD NEUROLOGICAL BOWEL NUTRITION STATUS  Other (Comment) (no behaviors, but pt highly anxious)   Continent Diet (Diet Regular)  AMBULATORY STATUS COMMUNICATION OF NEEDS Skin   Limited Assist  (NONE) Normal                       Personal Care Assistance Level of Assistance  Bathing, Feeding, Dressing Bathing Assistance: Limited assistance Feeding assistance: Independent Dressing Assistance: Limited assistance     Functional Limitations Info  Sight, Hearing, Speech Sight Info: Adequate Hearing Info:  Adequate Speech Info: Adequate    SPECIAL CARE FACTORS FREQUENCY                       Contractures Contractures Info: Not present    Additional Factors Info  Code Status, Allergies Code Status Info: DNR code status Allergies Info: Iopamidol, Omeprazole, Oxycodone, Prednisone, Zyrtec           Current Medications (07/29/2015):  This is the current hospital active medication list Current Facility-Administered Medications  Medication Dose Route Frequency Provider Last Rate Last Dose  . 0.9 %  sodium chloride infusion  250 mL Intravenous PRN Venetia Maxon Rama, MD      . acetaminophen (TYLENOL) tablet 650 mg  650 mg Oral Q6H PRN Venetia Maxon Rama, MD       Or  . acetaminophen (TYLENOL) suppository 650 mg  650 mg Rectal Q6H PRN Venetia Maxon Rama, MD      . ALPRAZolam Duanne Moron) tablet 0.5 mg  0.5 mg Oral TID PRN Venetia Maxon Rama, MD   0.5 mg at 07/29/15 0815  . ergocalciferol (DRISDOL) 8000 UNIT/ML drops 8,000 Units  8,000 Units Oral Daily Venetia Maxon Rama, MD   8,000 Units at 07/28/15 1050  . HYDROcodone-acetaminophen (NORCO/VICODIN) 5-325 MG per tablet 1-2 tablet  1-2 tablet Oral Q6H PRN Verlee Monte, MD   2 tablet at 07/29/15 0427  . naproxen (NAPROSYN) tablet 250 mg  250 mg Oral BID PRN Venetia Maxon Rama, MD   250 mg at 07/29/15 [id number]  . ondansetron (ZOFRAN) tablet 4  mg  4 mg Oral Q6H PRN Venetia Maxon Rama, MD       Or  . ondansetron (ZOFRAN) injection 4 mg  4 mg Intravenous Q6H PRN Christina P Rama, MD      . polyethylene glycol (MIRALAX / GLYCOLAX) packet 17 g  17 g Oral Daily PRN Christina P Rama, MD      . sodium chloride flush (NS) 0.9 % injection 3 mL  3 mL Intravenous Q12H Venetia Maxon Rama, MD   3 mL at 07/28/15 2047  . sodium chloride flush (NS) 0.9 % injection 3 mL  3 mL Intravenous PRN Venetia Maxon Rama, MD         Discharge Medications: Please see discharge summary for a list of discharge medications.  Relevant Imaging Results:  Relevant Lab Results:   Additional  Information SSN: [id number] Pt to have Hospice at ALF.   KIDD, SUZANNA A, LCSW

## 2015-07-30 ENCOUNTER — Ambulatory Visit: Payer: Self-pay | Admitting: Hematology

## 2015-07-30 ENCOUNTER — Other Ambulatory Visit: Payer: Self-pay

## 2015-07-30 DIAGNOSIS — Z515 Encounter for palliative care: Secondary | ICD-10-CM | POA: Diagnosis not present

## 2015-07-30 DIAGNOSIS — F329 Major depressive disorder, single episode, unspecified: Secondary | ICD-10-CM | POA: Diagnosis not present

## 2015-07-30 DIAGNOSIS — C7800 Secondary malignant neoplasm of unspecified lung: Secondary | ICD-10-CM

## 2015-07-30 DIAGNOSIS — G893 Neoplasm related pain (acute) (chronic): Secondary | ICD-10-CM | POA: Diagnosis not present

## 2015-07-30 DIAGNOSIS — C50911 Malignant neoplasm of unspecified site of right female breast: Secondary | ICD-10-CM | POA: Diagnosis not present

## 2015-07-30 DIAGNOSIS — R0789 Other chest pain: Secondary | ICD-10-CM | POA: Diagnosis not present

## 2015-07-30 DIAGNOSIS — F419 Anxiety disorder, unspecified: Secondary | ICD-10-CM | POA: Diagnosis not present

## 2015-07-30 DIAGNOSIS — C50919 Malignant neoplasm of unspecified site of unspecified female breast: Secondary | ICD-10-CM | POA: Diagnosis not present

## 2015-07-30 DIAGNOSIS — J90 Pleural effusion, not elsewhere classified: Secondary | ICD-10-CM | POA: Diagnosis not present

## 2015-07-30 DIAGNOSIS — R0781 Pleurodynia: Secondary | ICD-10-CM | POA: Diagnosis not present

## 2015-07-30 MED ORDER — SERTRALINE HCL 25 MG PO TABS: 25.0000 mg | ORAL_TABLET | Freq: Every day | ORAL | Status: DC

## 2015-07-30 MED ORDER — ALPRAZOLAM 0.5 MG PO TABS: 0.5000 mg | ORAL_TABLET | Freq: Three times a day (TID) | ORAL | Status: DC | PRN

## 2015-07-30 MED ORDER — TRAMADOL HCL 50 MG PO TABS: 50.0000 mg | ORAL_TABLET | Freq: Four times a day (QID) | ORAL | Status: DC | PRN

## 2015-07-30 MED ORDER — HYDROCODONE-ACETAMINOPHEN 5-325 MG PO TABS: 1.0000 | ORAL_TABLET | ORAL | Status: DC | PRN

## 2015-07-30 NOTE — Progress Notes (Signed)
Daily Progress Note   Patient Name: Patricia Carter       Date: 07/30/2015 DOB: 07-Nov-1955  Age: 60 y.o. MRN#: [id number] Attending Physician: Verlee Monte, MD Primary Care Physician: Gearlean Alf., PA-C Admit Date: 07/27/2015  Reason for Consultation/Follow-up: Establishing goals of care and Psychosocial/spiritual support  Subjective:- continued conversation regarding, diagnosis, prognosis, treatment options and advanced directive decisions. Ex- Husband Patricia Carter is present.  Patient is unable to discuss advanced directives today.  Today's discussion is around viable discharge plan  -open and honest discussion about her current social situation and limited support and limited discharge options  Patricia Carter gets very anxious about perceived getting "kicked out of the hospital", and then angry with  the situation.  After talking through her options and support of her Ex husband Patricia Carter she is able to accept the idea of discharging to a SNF for rehab services    Length of Stay: 3 days  Current Medications: Scheduled Meds:  . ergocalciferol  8,000 Units Oral Daily  . sodium chloride flush  3 mL Intravenous Q12H  . tuberculin  5 Units Intradermal Once    Continuous Infusions:    PRN Meds: sodium chloride, acetaminophen **OR** acetaminophen, ALPRAZolam, HYDROcodone-acetaminophen, naproxen, ondansetron **OR** ondansetron (ZOFRAN) IV, polyethylene glycol, sodium chloride flush  Physical Exam: Physical Exam  Constitutional: She appears cachectic.  Cardiovascular: Normal rate, regular rhythm and normal heart sounds.   Pulmonary/Chest: Effort normal and breath sounds normal.  Skin: Skin is warm and dry.  Psychiatric: Her mood appears anxious.                Vital Signs: BP 103/74 mmHg  Pulse 98   Temp(Src) 97.8 F (36.6 C) (Oral)  Resp 16  Ht 5\' 3"  (1.6 m)  Wt 44.453 kg (98 lb)  BMI 17.36 kg/m2  SpO2 99% SpO2: SpO2: 99 % O2 Device: O2 Device: Not Delivered O2 Flow Rate:    Intake/output summary:  Intake/Output Summary (Last 24 hours) at 07/30/15 1317 Last data filed at 07/30/15 1206  Gross per 24 hour  Intake    123 ml  Output     50 ml  Net     73 ml   LBM: Last BM Date: 07/28/15 Baseline Weight: Weight: 44.453 kg (98 lb) Most recent weight: Weight: 44.453 kg (98 lb)  Palliative Assessment/Data:   Additional Data Reviewed: CBC    Component Value Date/Time   WBC 4.0 07/27/2015 1212   WBC 4.9 07/19/2015 1239   RBC 4.76 07/27/2015 1212   RBC 4.93 07/19/2015 1239   HGB 13.3 07/27/2015 1212   HGB 13.7 07/19/2015 1239   HCT 38.5 07/27/2015 1212   HCT 41.6 07/19/2015 1239   PLT 281 07/27/2015 1212   PLT 290 07/19/2015 1239   MCV 80.9 07/27/2015 1212   MCV 84.4 07/19/2015 1239   MCH 27.9 07/27/2015 1212   MCH 27.8 07/19/2015 1239   MCHC 34.5 07/27/2015 1212   MCHC 33.0 07/19/2015 1239   RDW 12.6 07/27/2015 1212   RDW 13.1 07/19/2015 1239   LYMPHSABS 0.6* 07/27/2015 1212   LYMPHSABS 0.5* 07/19/2015 1239   MONOABS 0.5 07/27/2015 1212   MONOABS 0.8 07/19/2015 1239   EOSABS 0.0 07/27/2015 1212   EOSABS 0.0 07/19/2015 1239   BASOSABS 0.0 07/27/2015 1212   BASOSABS 0.0 07/19/2015 1239    CMP     Component Value Date/Time   NA 134* 07/27/2015 1212   NA 137 07/19/2015 1239   K 3.8 07/27/2015 1212   K 4.0 07/19/2015 1239   CL 97* 07/27/2015 1212   CO2 27 07/27/2015 1212   CO2 28 07/19/2015 1239   GLUCOSE 92 07/27/2015 1212   GLUCOSE 113 07/19/2015 1239   BUN 17 07/27/2015 1212   BUN 14.1 07/19/2015 1239   CREATININE 0.67 07/27/2015 1212   CREATININE 1.0 07/19/2015 1239   CALCIUM 9.3 07/27/2015 1212   CALCIUM 10.0 07/19/2015 1239   PROT 6.4* 07/27/2015 1212   PROT 7.5 07/19/2015 1239   ALBUMIN 3.5 07/27/2015 1212   ALBUMIN 4.0 07/19/2015  1239   AST 26 07/27/2015 1212   AST 27 07/19/2015 1239   ALT 15 07/27/2015 1212   ALT 15 07/19/2015 1239   ALKPHOS 62 07/27/2015 1212   ALKPHOS 72 07/19/2015 1239   BILITOT 0.7 07/27/2015 1212   BILITOT 0.50 07/19/2015 1239   GFRNONAA >60 07/27/2015 1212   GFRAA >60 07/27/2015 1212       Problem List:  Patient Active Problem List   Diagnosis Date Noted  . Metastatic breast cancer ([place]) 07/27/2015  . Pleuritic chest pain 07/27/2015  . Cancer associated pain 07/20/2015  . Back pain 06/13/2015  . Pleural effusion 06/12/2015  . Upper abdominal pain 06/12/2015  . Adjustment disorder with mixed anxiety and depressed mood 04/13/2015  . Neoplastic malignant related fatigue 08/27/2014  . Anxiety 08/27/2014  . Breast cancer metastasized to lung ([place]) 05/21/2014  . Acute pericardial effusion 05/04/2014  . Lung mass 04/30/2014     Palliative Care Assessment & Plan    1.Code Status:  DNR    Code Status Orders        Start     Ordered   07/27/15 1123  Do not attempt resuscitation (DNR)   Continuous    Question Answer Comment  In the event of cardiac or respiratory ARREST Do not call a "code blue"   In the event of cardiac or respiratory ARREST Do not perform Intubation, CPR, defibrillation or ACLS   In the event of cardiac or respiratory ARREST Use medication by any route, position, wound care, and other measures to relive pain and suffering. May use oxygen, suction and manual treatment of airway obstruction as needed for comfort.      07/27/15 1124    Code Status History    Date Active Date Inactive Code Status  Order ID Comments User Context   06/12/2015 12:06 PM 06/14/2015  3:54 PM Full Code [id number]  Bonnielee Haff, MD Inpatient   05/05/2014  3:47 PM 05/09/2014  6:51 PM Full Code [id number]  Gaye Pollack, MD Inpatient   05/04/2014  3:22 PM 05/05/2014  3:47 PM Full Code [id number]  Tanda Rockers, MD Inpatient    Advance Directive Documentation        Most Recent Value    Type of Advance Directive  Living will, Out of facility DNR (pink MOST or yellow form)   Pre-existing out of facility DNR order (yellow form or pink MOST form)     "MOST" Form in Place?          Goals of Care/Additional Recommendations:   Continue current treatment plan, patient and her family will continue discussion for advanced care planning.  Patient is hopeful for continued quality life.  Recommend PCS to follow on discharge    Symptom Management:      1. Anxiety: patient refuses any recommended medications for treatment of anxiety   . Discharge Planning:  SNF for rehabilitation- this has been very distressing to [place] today.  I tis hard for her to accept the limited care options presetned to her.   Care plan was discussed with Dr Hartford Poli  Thank you for allowing the Palliative Medicine Team to assist in the care of this patient.   Time In: 1300 Time Out: 1400 Total Time 60 min Prolonged Time Billed  no         Knox Royalty, NP  07/30/2015, 1:17 PM  Please contact Palliative Medicine Team phone at [phone] for questions and concerns.

## 2015-07-30 NOTE — Progress Notes (Signed)
Patricia Carter   DOB:[birth year]   [id number]   [id number]  Subjective:  I saw Patricia Carter this morning, she appears to be calmer than before, her chest pain is better controlled. Her ex-husband Patricia Carter will meet  Palliative care team NP [facility]  In the early afternoon today to discuss her disposition.  Objective:  Filed Vitals:   07/29/15 2140 07/30/15 0622  BP: 117/82 103/74  Pulse: 102 98  Temp: 98.3 F (36.8 C) 97.8 F (36.6 C)  Resp: 18 16    Body mass index is 17.36 kg/(m^2).  Intake/Output Summary (Last 24 hours) at 07/30/15 1000 Last data filed at 07/30/15 [id number]  Gross per 24 hour  Intake    123 ml  Output      0 ml  Net    123 ml     In mild distress due to her anxiety and pain  Sclerae unicteric  Oropharynx clear  No peripheral adenopathy  Lungs clear -- no rales or rhonchi,  Decreased breath sounds in the right lung base  Heart regular rate and rhythm  Abdomen benign  MSK no focal spinal tenderness, no peripheral edema  Neuro nonfocal   CBG (last 3)  No results for input(s): GLUCAP in the last 72 hours.   Labs:  Lab Results  Component Value Date   WBC 4.0 07/27/2015   HGB 13.3 07/27/2015   HCT 38.5 07/27/2015   MCV 80.9 07/27/2015   PLT 281 07/27/2015   NEUTROABS 2.9 07/27/2015    @LASTCHEMISTRY @  Urine Studies No results for input(s): UHGB, CRYS in the last 72 hours.  Invalid input(s): UACOL, UAPR, USPG, UPH, UTP, UGL, UKET, UBIL, UNIT, UROB, ULEU, UEPI, UWBC, URBC, UBAC, CAST, UCOM, BILUA  Basic Metabolic Panel:  Recent Labs Lab 07/27/15 1212  NA 134*  K 3.8  CL 97*  CO2 27  GLUCOSE 92  BUN 17  CREATININE 0.67  CALCIUM 9.3  MG 1.9  PHOS 2.7   GFR Estimated Creatinine Clearance: 51.9 mL/min (by C-G formula based on Cr of 0.67). Liver Function Tests:  Recent Labs Lab 07/27/15 1212  AST 26  ALT 15  ALKPHOS 62  BILITOT 0.7  PROT 6.4*  ALBUMIN 3.5   No results for input(s): LIPASE, AMYLASE in the last 168 hours. No results for  input(s): AMMONIA in the last 168 hours. Coagulation profile No results for input(s): INR, PROTIME in the last 168 hours.  CBC:  Recent Labs Lab 07/27/15 1212  WBC 4.0  NEUTROABS 2.9  HGB 13.3  HCT 38.5  MCV 80.9  PLT 281   Cardiac Enzymes: No results for input(s): CKTOTAL, CKMB, CKMBINDEX, TROPONINI in the last 168 hours. BNP: Invalid input(s): POCBNP CBG: No results for input(s): GLUCAP in the last 168 hours. D-Dimer  Recent Labs  07/27/15 1212  DDIMER 1.54*   Hgb A1c No results for input(s): HGBA1C in the last 72 hours. Lipid Profile No results for input(s): CHOL, HDL, LDLCALC, TRIG, CHOLHDL, LDLDIRECT in the last 72 hours. Thyroid function studies No results for input(s): TSH, T4TOTAL, T3FREE, THYROIDAB in the last 72 hours.  Invalid input(s): FREET3 Anemia work up No results for input(s): VITAMINB12, FOLATE, FERRITIN, TIBC, IRON, RETICCTPCT in the last 72 hours. Microbiology Recent Results (from the past 240 hour(s))  Culture, body fluid-bottle     Status: None (Preliminary result)   Collection Time: 07/27/15  3:17 PM  Result Value Ref Range Status   Specimen Description FLUID PLEURAL  Final   Special Requests BOTTLES DRAWN AEROBIC  AND ANAEROBIC 5CC  Final   Culture   Final    NO GROWTH 2 DAYS Performed at [facility]    Report Status PENDING  Incomplete  Gram stain     Status: None   Collection Time: 07/27/15  3:17 PM  Result Value Ref Range Status   Specimen Description FLUID PLEURAL  Final   Special Requests NONE  Final   Gram Stain   Final    MODERATE WBC PRESENT, PREDOMINANTLY MONONUCLEAR NO ORGANISMS SEEN Performed at [facility]    Report Status 07/27/2015 FINAL  Final      Studies:  No results found.  Assessment: 60 y.o.  1.  Right chest pain, secondary to pleural metastasis,  Better controlled 2.  Metastatic breast cancer,  Not on treatment 3.  Severe anxiety and depression and frequent panic attacks  4. Right  pleural effusion  5.  malnutrition  Plan:  -due to her underline psychologically issue,   In her right pleural effusion is not causing much symptoms, I  Do not think she would benefit from Pleurx at this time,  She understands she can always get pleurx or repeat thoracentesis if needed after hospital discharge - I recommend home hospice , or at least home care,  If she goes home.  I think nursing home would be a better option for her, but she is not a very interested. I'm not sure if she can handle her panic attacks and anxiety at home if she stays home alone. -she is DNR/DNI -I will continue following her.     Truitt Merle, MD 07/30/2015  10:00 AM

## 2015-07-30 NOTE — Progress Notes (Signed)
Pt discharged to: SNF    Via: Non-Emergency Ambulance Education reviewed & given: Given and signed by ex-husband Belongings given to pt: Given to patient and ex-husband IV D/C'd: Clean, Dry and Intact upon removal

## 2015-07-30 NOTE — NC FL2 (Signed)
[place] LEVEL OF CARE SCREENING TOOL     IDENTIFICATION  Patient Name: Patricia Carter Birthdate: Sep 08, 1955 Sex: female Admission Date (Current Location): 07/27/2015  [place] and [place] Number:  Kathleen Argue [id number] [place] and Address:  [facility],  [place] [address], [place]      Provider Number: [id number]  Attending Physician Name and Address:  Verlee Monte, MD  Relative Name and Phone Number:       Current Level of Care: Hospital Recommended Level of Care: [place] Prior Approval Number:    Date Approved/Denied:   PASRR Number: [id number]  Discharge Plan: Other (Comment) ([place])    Current Diagnoses: Patient Active Problem List   Diagnosis Date Noted  . Palliative care encounter 07/30/2015  . Metastatic breast cancer ([place]) 07/27/2015  . Pleuritic chest pain 07/27/2015  . Cancer associated pain 07/20/2015  . Back pain 06/13/2015  . Pleural effusion 06/12/2015  . Upper abdominal pain 06/12/2015  . Adjustment disorder with mixed anxiety and depressed mood 04/13/2015  . Neoplastic malignant related fatigue 08/27/2014  . Anxiety 08/27/2014  . Breast cancer metastasized to lung ([place]) 05/21/2014  . Acute pericardial effusion 05/04/2014  . Lung mass 04/30/2014    Orientation RESPIRATION BLADDER Height & Weight     Self, Time, Situation, Place  Normal Continent Weight: 98 lb (44.453 kg) Height:  5\' 3"  (160 cm)  BEHAVIORAL SYMPTOMS/MOOD NEUROLOGICAL BOWEL NUTRITION STATUS  Other (Comment) (no behaviors, but pt highly anxious)   Continent Diet (Diet Regular)  AMBULATORY STATUS COMMUNICATION OF NEEDS Skin   Limited Assist  (NONE) Normal                       Personal Care Assistance Level of Assistance  Bathing, Feeding, Dressing Bathing Assistance: Limited assistance Feeding assistance: Independent Dressing Assistance: Limited assistance     Functional Limitations Info  Sight,  Hearing, Speech Sight Info: Adequate Hearing Info: Adequate Speech Info: Adequate    SPECIAL CARE FACTORS FREQUENCY  PT (By licensed PT)     PT Frequency: 3 x a week              Contractures Contractures Info: Not present    Additional Factors Info  Code Status, Allergies Code Status Info: DNR code status Allergies Info: Iopamidol, Omeprazole, Oxycodone, Prednisone, Zyrtec           Current Medications (07/30/2015):  This is the current hospital active medication list Current Facility-Administered Medications  Medication Dose Route Frequency Provider Last Rate Last Dose  . 0.9 %  sodium chloride infusion  250 mL Intravenous PRN Venetia Maxon Rama, MD      . acetaminophen (TYLENOL) tablet 650 mg  650 mg Oral Q6H PRN Christina P Rama, MD       Or  . acetaminophen (TYLENOL) suppository 650 mg  650 mg Rectal Q6H PRN Venetia Maxon Rama, MD      . ALPRAZolam Duanne Moron) tablet 0.5 mg  0.5 mg Oral TID PRN Venetia Maxon Rama, MD   0.5 mg at 07/30/15 0921  . ergocalciferol (DRISDOL) 8000 UNIT/ML drops 8,000 Units  8,000 Units Oral Daily Venetia Maxon Rama, MD   8,000 Units at 07/30/15 1210  . HYDROcodone-acetaminophen (NORCO/VICODIN) 5-325 MG per tablet 1-2 tablet  1-2 tablet Oral Q4H PRN Verlee Monte, MD   2 tablet at 07/30/15 0921  . naproxen (NAPROSYN) tablet 250 mg  250 mg Oral BID PRN Venetia Maxon Rama, MD   250  mg at 07/29/15 0312  . ondansetron (ZOFRAN) tablet 4 mg  4 mg Oral Q6H PRN Venetia Maxon Rama, MD       Or  . ondansetron (ZOFRAN) injection 4 mg  4 mg Intravenous Q6H PRN Venetia Maxon Rama, MD   4 mg at 07/30/15 0223  . polyethylene glycol (MIRALAX / GLYCOLAX) packet 17 g  17 g Oral Daily PRN Venetia Maxon Rama, MD   17 g at 07/29/15 2347  . sodium chloride flush (NS) 0.9 % injection 3 mL  3 mL Intravenous Q12H Venetia Maxon Rama, MD   3 mL at 07/30/15 0922  . sodium chloride flush (NS) 0.9 % injection 3 mL  3 mL Intravenous PRN Venetia Maxon Rama, MD      . tuberculin injection 5 Units  5  Units Intradermal Once Knox Royalty, NP   5 Units at 07/29/15 1509     Discharge Medications: Please see discharge summary for a list of discharge medications.  Relevant Imaging Results:  Relevant Lab Results:   Additional Information SSN: [id number] Pt to have palliative care services follow at SNF.   Coraline Talwar A, LCSW

## 2015-07-30 NOTE — Progress Notes (Signed)
Contacted Dr. Ernestina Penna nurse and advised that patient would like her to visit her before she is discharged. Dr. Ernestina Penna nurse said she would relay the message.

## 2015-07-30 NOTE — NC FL2 (Signed)
[place] LEVEL OF CARE SCREENING TOOL     IDENTIFICATION  Patient Name: Patricia Carter Birthdate: 06-Aug-1955 Sex: female Admission Date (Current Location): 07/27/2015  [place] and [place] Number:  Kathleen Argue [id number] [place] and Address:  [facility],  [place] [address] [place]      Provider Number: [id number]  Attending Physician Name and Address:  Verlee Monte, MD  Relative Name and Phone Number:       Current Level of Care: Hospital Recommended Level of Care: [place] Prior Approval Number:    Date Approved/Denied:   PASRR Number: [id number]  Discharge Plan: Other (Comment) ([place])    Current Diagnoses: Patient Active Problem List   Diagnosis Date Noted  . Metastatic breast cancer ([place]) 07/27/2015  . Pleuritic chest pain 07/27/2015  . Cancer associated pain 07/20/2015  . Back pain 06/13/2015  . Pleural effusion 06/12/2015  . Upper abdominal pain 06/12/2015  . Adjustment disorder with mixed anxiety and depressed mood 04/13/2015  . Neoplastic malignant related fatigue 08/27/2014  . Anxiety 08/27/2014  . Breast cancer metastasized to lung ([place]) 05/21/2014  . Acute pericardial effusion 05/04/2014  . Lung mass 04/30/2014    Orientation RESPIRATION BLADDER Height & Weight     Self, Time, Situation, Place  Normal Continent Weight: 98 lb (44.453 kg) Height:  5\' 3"  (160 cm)  BEHAVIORAL SYMPTOMS/MOOD NEUROLOGICAL BOWEL NUTRITION STATUS  Other (Comment) (no behaviors, but pt highly anxious)   Continent Diet (Diet Regular)  AMBULATORY STATUS COMMUNICATION OF NEEDS Skin   Limited Assist  (NONE) Normal                       Personal Care Assistance Level of Assistance  Bathing, Feeding, Dressing Bathing Assistance: Limited assistance Feeding assistance: Independent Dressing Assistance: Limited assistance     Functional Limitations Info  Sight, Hearing, Speech Sight Info:  Adequate Hearing Info: Adequate Speech Info: Adequate    SPECIAL CARE FACTORS FREQUENCY                       Contractures Contractures Info: Not present    Additional Factors Info  Code Status, Allergies Code Status Info: DNR code status Allergies Info: Iopamidol, Omeprazole, Oxycodone, Prednisone, Zyrtec           Current Medications (07/30/2015):  This is the current hospital active medication list Current Facility-Administered Medications  Medication Dose Route Frequency Provider Last Rate Last Dose  . 0.9 %  sodium chloride infusion  250 mL Intravenous PRN Venetia Maxon Rama, MD      . acetaminophen (TYLENOL) tablet 650 mg  650 mg Oral Q6H PRN Christina P Rama, MD       Or  . acetaminophen (TYLENOL) suppository 650 mg  650 mg Rectal Q6H PRN Venetia Maxon Rama, MD      . ALPRAZolam Duanne Moron) tablet 0.5 mg  0.5 mg Oral TID PRN Venetia Maxon Rama, MD   0.5 mg at 07/30/15 0921  . ergocalciferol (DRISDOL) 8000 UNIT/ML drops 8,000 Units  8,000 Units Oral Daily Venetia Maxon Rama, MD   8,000 Units at 07/30/15 1210  . HYDROcodone-acetaminophen (NORCO/VICODIN) 5-325 MG per tablet 1-2 tablet  1-2 tablet Oral Q4H PRN Verlee Monte, MD   2 tablet at 07/30/15 0921  . naproxen (NAPROSYN) tablet 250 mg  250 mg Oral BID PRN Venetia Maxon Rama, MD   250 mg at 07/29/15 [id number]  . ondansetron (ZOFRAN) tablet 4 mg  4 mg Oral Q6H PRN Venetia Maxon Rama, MD       Or  . ondansetron (ZOFRAN) injection 4 mg  4 mg Intravenous Q6H PRN Venetia Maxon Rama, MD   4 mg at 07/30/15 0223  . polyethylene glycol (MIRALAX / GLYCOLAX) packet 17 g  17 g Oral Daily PRN Venetia Maxon Rama, MD   17 g at 07/29/15 2347  . sodium chloride flush (NS) 0.9 % injection 3 mL  3 mL Intravenous Q12H Venetia Maxon Rama, MD   3 mL at 07/30/15 0922  . sodium chloride flush (NS) 0.9 % injection 3 mL  3 mL Intravenous PRN Venetia Maxon Rama, MD      . tuberculin injection 5 Units  5 Units Intradermal Once Knox Royalty, NP   5 Units at 07/29/15 1509      Discharge Medications: Please see discharge summary for a list of discharge medications.  Relevant Imaging Results:  Relevant Lab Results:   Additional Information SSN: [id number] Pt to have Hospice at ALF.   Ranita Stjulien A, LCSW

## 2015-07-30 NOTE — Progress Notes (Signed)
Spoke with Seth Bake from SNF where pt is to transfer. Gave report on patient including most recent vital signs. All questions were answered. No concerns from SNF.

## 2015-07-30 NOTE — Discharge Summary (Signed)
Physician Discharge Summary  Roshana Parnes [id number] DOB: 12-Feb-1956 DOA: 07/27/2015  PCP: Gearlean Alf., PA-C  Admit date: 07/27/2015 Discharge date: 07/30/2015  Time spent: 40 minutes  Recommendations for Outpatient Follow-up:  1. Discharge to skilled nursing facility, palliative care to follow. 2. Follow-up with Dr. Burr Medico in 2 weeks.   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 female with a PMH of stage IIB, left breast cancer diagnosed in August 2011, Status post left mastectomy with reconstruction, implant and flap, subsequently declined adjuvant chemotherapy and endocrine therapy, lost to follow-up, who began to have dry cough and dyspnea 08/2014 with multiple ED visits treated with multiple courses of antibiotics for bronchitis. In 04/2015, she was evaluated by pulmonology and a CT of the chest was obtained which showed a right hilar mass with compression on the SVC and moderate pericardial effusion. She subsequently underwent bronchoscopy and pericardial window 05/01/14. 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 She subsequently started palliative radiation therapy on 06/10/14 and completed 14 treatments. She developed substernal chest pain and dysphasia from radiation treatments. She subsequently underwent a right thoracentesis on 07/23/15 with 1.2 L removed, cytology negative. She was seen by Dr. Burr Medico in the office today for an urgent visit. She reports that she has had worsening chest pain since the thoracentesis was done, and has had significant anxiety and inability to  sleep. She is tearful and anxious when I interview her, complaining of right-sided chest and back pain, sharp in nature, partially relieved by low doses of hydrocodone. No diagnostic evaluation was undertaken prior to her being referred here as a direct admission.  Brief hospital course:  Pleural effusion with pleuritic type chest pain - Chest x-ray shows a large right pleural effusion with consolidation of the right mid and lower lung. - Right-sided thoracentesis done on 3/28 with removal of only 0.65 L of serosanguineous fluids (showed only reactive cells) - Procedure terminated at patient request secondary to chest pain. - Pleural effusion is likely malignant effusion, FNAC and brushing from bronchoscopy from 05/05/2014 showed malignant cells. - Per Dr. Burr Medico recommendation needs palliative care. Does not think she will tolerate PleurX catheter.  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 Burr Medico patient needs hospice. She is going to SNF, so she will be a SNF with palliative care.  Anxiety - Patient has severe anxiety, its likely the main driver of her symptoms now. Started on Xanax. - She also gets panic attacks. - Xanax dose increased to 0.5-1 mg, also started on Zoloft 25 mg can increase to 50 mg in 1-2 weeks..  Cancer associated pain -Tolerates hydrocodone, but is very sensitive to narcotic-based pain medications. -Continued tramadol, Vicodin added to help with the pain control, pain mainly in the left side of her chest.   Consultants:  Oncology  Palliative care.  Procedures:  Right-sided thoracentesis with removal of 650 mL of serosanguineous fluid done on 07/27/2015.  Discharge Exam: Filed Vitals:   07/29/15 2140 07/30/15 0622  BP: 117/82 103/74  Pulse: 102 98  Temp: 98.3 F (36.8 C) 97.8 F (36.6 C)  Resp: 18 16   General: Alert and awake, oriented x3, not in any acute distress. HEENT: anicteric sclera, pupils reactive to light and accommodation, EOMI CVS:  S1-S2 clear, no murmur rubs or gallops Chest: clear to auscultation bilaterally, no wheezing, rales or rhonchi Abdomen: soft nontender, nondistended, normal bowel sounds, no organomegaly Extremities: no cyanosis, clubbing or edema noted bilaterally Neuro: Cranial nerves II-XII intact, no focal neurological deficits  Discharge Instructions   Discharge Instructions    Diet - low sodium heart healthy    Complete by:  As directed      Increase activity slowly    Complete by:  As directed           Current Discharge Medication List    START taking these medications   Details  HYDROcodone-acetaminophen (NORCO/VICODIN) 5-325 MG tablet Take 1-2 tablets by mouth every 4 (four) hours as needed for moderate pain. Qty: 20 tablet, Refills: 0    sertraline (ZOLOFT) 25 MG tablet Take 1 tablet (25 mg total) by mouth daily.      CONTINUE these medications which have CHANGED   Details  ALPRAZolam (XANAX) 0.5 MG tablet Take 1-2 tablets (0.5-1 mg total) by mouth 3 (three) times daily as needed for anxiety. Take 1-2 tabs PO TID PRN anxiety. Qty: 20 tablet, Refills: 0   Associated Diagnoses: Malignant neoplasm of female breast, unspecified laterality, unspecified site of breast (HCC)    traMADol (ULTRAM) 50 MG tablet Take 1 tablet (50 mg total) by mouth every 6 (six) hours as needed for moderate pain or severe pain. Qty: 20 tablet, Refills: 0      CONTINUE these medications which have NOT CHANGED   Details  ergocalciferol (DRISDOL) 8000 UNIT/ML drops Take 8,000 Units by mouth daily.      STOP taking these medications     LORazepam (ATIVAN) 1 MG tablet      naproxen sodium (ANAPROX) 220 MG tablet        Allergies  Allergen Reactions  . Iopamidol Hives    Uncoded Allergy. Allergen: DIZIPROMINE, Other Reaction: OTHER REACTION  . Omeprazole Other (See Comments)    Abdominal cramps and constipation   . Oxycodone Other (See Comments)    Migraines and dizziness   . Prednisone  Palpitations  . Zyrtec [Cetirizine] Palpitations      The results of significant diagnostics from this hospitalization (including imaging, microbiology, ancillary and laboratory) are listed below for reference.    Significant Diagnostic Studies: Dg Chest 1 View  07/27/2015  CLINICAL DATA:  Status post right thoracentesis EXAM: CHEST 1 VIEW COMPARISON:  July 27, 2015 11:33 a.m. FINDINGS: There is persistent large right pleural effusion without significant interval change. Consolidation of the right mid and lower lung are identified. The left lung is clear. There is no pneumothorax. The osseous structures are stable. IMPRESSION: Large right pleural effusion unchanged compared prior exam. There is no pneumothorax. Electronically Signed   By: Abelardo Diesel M.D.   On: 07/27/2015 16:18   Dg Chest 1 View  07/23/2015  CLINICAL DATA:  Right pleural effusion. Status post right thoracentesis. EXAM: CHEST 1 VIEW COMPARISON:  07/17/2015 and 06/12/2015 FINDINGS: There has been a reduction in the large right pleural effusion. Moderate effusion persists. No pneumothorax. Left lung is clear. Heart size and pulmonary vascularity are normal. IMPRESSION: Decreased right pleural effusion.  No pneumothorax. Electronically Signed   By: Lorriane Shire M.D.   On: 07/23/2015 11:09   Dg Chest 2 View  07/24/2015  CLINICAL DATA:  60 year old female with a history of severe shortness of breath. Thoracentesis 07/23/2015 EXAM: CHEST - 2 VIEW COMPARISON:  07/23/2015, 07/17/2015, CT 06/12/2015 FINDINGS: Cardiomediastinal silhouette partially obscured by overlying lung and pleural disease. Obscuration of the right heart border. Dense opacity at the right lung base obscuring the right hemidiaphragm, right heart border, and increasing from the comparison plain film. Pleural parenchymal thickening. Architectural distortion in the right hilum. Left lung relatively well aerated with chronic changes. IMPRESSION: Persisting right basilar  opacity, compatible with pleural effusion and associated atelectasis. Compared to the prior chest x-ray, volume appears to be increasing again. Left lung relatively well aerated. Signed, Dulcy Fanny. Earleen Newport, DO [facility] Electronically Signed   By: Corrie Mckusick D.O.   On: 07/24/2015 10:00   Dg Chest 2 View  07/17/2015  CLINICAL DATA:  Shortness of breath EXAM: CHEST  2 VIEW COMPARISON:  June 12, 2015 FINDINGS: There is a large right pleural effusion and underlying opacity, larger in the interval. Mild patchy opacity in the aerated portion of the right upper lung is a little more prominent as well. The left lung remains clear. No pneumothorax. No other changes. IMPRESSION: Increasing right pleural fluid with underlying opacity. Mild patchy opacity in the aerated portion of the right upper lung is a little more prominent as well. Electronically Signed   By: Dorise Bullion III M.D   On: 07/17/2015 10:57   Portable Chest 1 View  07/27/2015  CLINICAL DATA:  Right pleuritic chest pain. Patient has thoracentesis July 23, 2015 EXAM: PORTABLE CHEST 1 VIEW COMPARISON:  July 24, 2015 FINDINGS: The heart size and mediastinal contours are stable. There is a large right pleural effusion with consolidation of the right mid and lower lung. The left lung is hyperinflated. The visualized skeletal structures are stable. IMPRESSION: Large right pleural effusion with consolidation of the right mid and lower lung. Electronically Signed   By: Abelardo Diesel M.D.   On: 07/27/2015 12:55   US Thoracentesis Asp Pleural Space W/img Guide  07/27/2015  INDICATION: History of breast cancer with evidence of metastatic lung disease. She has a recurrent right pleural effusion with chronic right pleuritic chest pain. Requested been made for diagnostic and therapeutic thoracentesis today. EXAM: ULTRASOUND GUIDED DIAGNOSTIC AND THERAPEUTIC THORACENTESIS MEDICATIONS: 1% lidocaine  COMPLICATIONS: None immediate. PROCEDURE: An ultrasound guided thoracentesis was thoroughly discussed with the patient and questions answered. The benefits, risks, alternatives and complications were also discussed. The patient understands and wishes to proceed with the procedure. Written consent was obtained. Ultrasound was performed to localize and mark an adequate pocket of fluid in the right chest. The area was then prepped and draped in the normal sterile fashion. 1% Lidocaine was used for local anesthesia. A Safe-T-Centesis catheter was introduced. Thoracentesis was performed. The catheter was removed and a dressing applied. FINDINGS: A total of approximately 0.65 L of serosanguineous fluid was removed. Samples were sent to the laboratory as requested by the clinical team. IMPRESSION: Successful ultrasound guided right thoracentesis yielding 0.65 L of pleural fluid. Once again her procedure was terminated at the patient's request secondary to chest pain. Read by: Saverio Danker, PA-C Electronically Signed   By: Markus Daft M.D.   On: 07/27/2015 15:53   US Thoracentesis Asp Pleural Space W/img Guide  07/23/2015  INDICATION: History of breast cancer with metastatic disease to her lung. Recent shortness of breath and found have a large right pleural effusion. Request has been made for diagnostic and therapeutic  right thoracentesis. EXAM: ULTRASOUND GUIDED DIAGNOSTIC AND THERAPEUTIC THORACENTESIS MEDICATIONS: 1% LIDOCAINE COMPLICATIONS: None immediate. PROCEDURE: An ultrasound guided thoracentesis was thoroughly discussed with the patient and questions answered. The benefits, risks, alternatives and complications were also discussed. The patient understands and wishes to proceed with the procedure. Written consent was obtained. Ultrasound was performed to localize and mark an adequate pocket of fluid in the RIGHT chest. The area was then prepped and draped in the normal sterile fashion. 1% Lidocaine was used for  local anesthesia. A Safe-T-Centesis catheter was introduced. Thoracentesis was performed. The procedure was terminated secondary to chest pain. There was still a fair amount of fluid left in the pleural space. The catheter was removed and a dressing applied. She did take a dose of tramadol 50 mg that she brought from home for pain after the procedure. This was discussed with Dr. Laurence Ferrari as well. FINDINGS: A total of approximately 1.25 L of clear yellow fluid was removed. Samples were sent to the laboratory as requested by the clinical team. IMPRESSION: Successful ultrasound guided right thoracentesis yielding 1.25 L of pleural fluid. Read by: Saverio Danker, PA-C Electronically Signed   By: Jacqulynn Cadet M.D.   On: 07/23/2015 11:15    Microbiology: Recent Results (from the past 240 hour(s))  Culture, body fluid-bottle     Status: None (Preliminary result)   Collection Time: 07/27/15  3:17 PM  Result Value Ref Range Status   Specimen Description FLUID PLEURAL  Final   Special Requests BOTTLES DRAWN AEROBIC AND ANAEROBIC 5CC  Final   Culture   Final    NO GROWTH 3 DAYS Performed at [facility]    Report Status PENDING  Incomplete  Gram stain     Status: None   Collection Time: 07/27/15  3:17 PM  Result Value Ref Range Status   Specimen Description FLUID PLEURAL  Final   Special Requests NONE  Final   Gram Stain   Final    MODERATE WBC PRESENT, PREDOMINANTLY MONONUCLEAR NO ORGANISMS SEEN Performed at [facility]    Report Status 07/27/2015 FINAL  Final     Labs: Basic Metabolic Panel:  Recent Labs Lab 07/27/15 1212  NA 134*  K 3.8  CL 97*  CO2 27  GLUCOSE 92  BUN 17  CREATININE 0.67  CALCIUM 9.3  MG 1.9  PHOS 2.7   Liver Function Tests:  Recent Labs Lab 07/27/15 1212  AST 26  ALT 15  ALKPHOS 62  BILITOT 0.7  PROT 6.4*  ALBUMIN 3.5   No results for input(s): LIPASE, AMYLASE in the last 168 hours. No results for input(s): AMMONIA in the last  168 hours. CBC:  Recent Labs Lab 07/27/15 1212  WBC 4.0  NEUTROABS 2.9  HGB 13.3  HCT 38.5  MCV 80.9  PLT 281   Cardiac Enzymes: No results for input(s): CKTOTAL, CKMB, CKMBINDEX, TROPONINI in the last 168 hours. BNP: BNP (last 3 results)  Recent Labs  01/02/15 1349  BNP 28.5    ProBNP (last 3 results) No results for input(s): PROBNP in the last 8760 hours.  CBG: No results for input(s): GLUCAP in the last 168 hours.     Signed:  Birdie Hopes MD.  [facility] 07/30/2015, 2:43 PM

## 2015-07-30 NOTE — Progress Notes (Signed)
Chaplain visit a follow up prior to discharge.  Ms Patricia Carter is slightly anxious about her release from the hospital and the prospect of a painful ride to home to gather her belongings and then to an care facility. This is an understandable reaction. She openly admits much of her anxiety of late is keyed by the anticipation of pain and the anticipation of isolation, rather than the pain or loneliness she experiences. Clearly she has a low threshold of physical pain, mental pain and spiritual pain. Our discussion centered on the amplification of pain by pain responses. She has returned to her meditation exercises, which help in abetting the fear responses to her physical pain.  Ms Patricia Carter is under the impression that she is a hospice patient. Her record does not reflect this. Likely she has the common misconception that Palliative Care and Palliative consults equal hospice care. We discussed the difference but this part of our conversation may have been muted by her drifting off to sleep.  I find Ms Patricia Carter attentive, well reasoned, open to all forms of hospital care and a deeply religious person. She, like many patients, finds her hospital stay as exhausting, because of the required monitoring and disruptions. She is looking forward to getting out the hospital so she can rest.  Chaplains are ready and available should Ms Patricia Carter need or request further Spiritual Care.  Sallee Lange. Nazar Kuan, DMin, MDiv Chaplain

## 2015-07-30 NOTE — Clinical Social Work Placement (Signed)
   CLINICAL SOCIAL WORK PLACEMENT  NOTE  Date:  07/30/2015  Patient Details  Name: Patricia Carter MRN: [id number] Date of Birth: [birth year]  Clinical Social Work is seeking post-discharge placement for this patient at the [place] level of care (*CSW will initial, date and re-position this form in  chart as items are completed):  Yes   Patient/family provided with [place] Work Department's list of facilities offering this level of care within the geographic area requested by the patient (or if unable, by the patient's family).  Yes   Patient/family informed of their freedom to choose among providers that offer the needed level of care, that participate in Medicare, Medicaid or managed care program needed by the patient, have an available bed and are willing to accept the patient.  Yes   Patient/family informed of [place]'s ownership interest in [facility] and [facility], as well as of the fact that they are under no obligation to receive care at these facilities.  PASRR submitted to EDS on 07/30/15     PASRR number received on 07/30/15     Existing PASRR number confirmed on       FL2 transmitted to all facilities in geographic area requested by pt/family on 07/30/15     FL2 transmitted to all facilities within larger geographic area on       Patient informed that his/her managed care company has contracts with or will negotiate with certain facilities, including the following:        Yes   Patient/family informed of bed offers received.  Patient chooses bed at [facility]     Physician recommends and patient chooses bed at      Patient to be transferred to [facility] on 07/30/15.  Patient to be transferred to facility by ambulance [facility]y Harold)     Patient family notified on 07/30/15 of transfer.  Name of family member notified:  pt and pt ex husband, [name] notified at bedside      PHYSICIAN Please sign FL2, Please sign DNR     Additional Comment:    _______________________________________________ Ladell Pier, LCSW 07/30/2015, 6:58 PM

## 2015-07-30 NOTE — NC FL2 (Signed)
[place] LEVEL OF CARE SCREENING TOOL     IDENTIFICATION  Patient Name: Patricia Carter Birthdate: [birth year] Sex: female Admission Date (Current Location): 07/27/2015  [place] and [place] Number:  Kathleen Argue [id number] [place] and Address:  [facility],  [place] [address], [place]      Provider Number: [id number]  Attending Physician Name and Address:  Verlee Monte, MD  Relative Name and Phone Number:       Current Level of Care: Hospital Recommended Level of Care: [place] Prior Approval Number:    Date Approved/Denied:   PASRR Number: [id number]  Discharge Plan: Other (Comment) ([place])    Current Diagnoses: Patient Active Problem List   Diagnosis Date Noted  . Metastatic breast cancer ([place]) 07/27/2015  . Pleuritic chest pain 07/27/2015  . Cancer associated pain 07/20/2015  . Back pain 06/13/2015  . Pleural effusion 06/12/2015  . Upper abdominal pain 06/12/2015  . Adjustment disorder with mixed anxiety and depressed mood 04/13/2015  . Neoplastic malignant related fatigue 08/27/2014  . Anxiety 08/27/2014  . Breast cancer metastasized to lung ([place]) 05/21/2014  . Acute pericardial effusion 05/04/2014  . Lung mass 04/30/2014    Orientation RESPIRATION BLADDER Height & Weight     Self, Time, Situation, Place  Normal Continent Weight: 98 lb (44.453 kg) Height:  5\' 3"  (160 cm)  BEHAVIORAL SYMPTOMS/MOOD NEUROLOGICAL BOWEL NUTRITION STATUS  Other (Comment) (no behaviors, but pt highly anxious)   Continent Diet (Diet Regular)  AMBULATORY STATUS COMMUNICATION OF NEEDS Skin   Limited Assist  (NONE) Normal                       Personal Care Assistance Level of Assistance  Bathing, Feeding, Dressing Bathing Assistance: Limited assistance Feeding assistance: Independent Dressing Assistance: Limited assistance     Functional Limitations Info  Sight, Hearing, Speech Sight Info:  Adequate Hearing Info: Adequate Speech Info: Adequate    SPECIAL CARE FACTORS FREQUENCY                       Contractures Contractures Info: Not present    Additional Factors Info  Code Status, Allergies Code Status Info: DNR code status Allergies Info: Iopamidol, Omeprazole, Oxycodone, Prednisone, Zyrtec           Current Medications (07/30/2015):  This is the current hospital active medication list Current Facility-Administered Medications  Medication Dose Route Frequency Provider Last Rate Last Dose  . 0.9 %  sodium chloride infusion  250 mL Intravenous PRN Venetia Maxon Rama, MD      . acetaminophen (TYLENOL) tablet 650 mg  650 mg Oral Q6H PRN Christina P Rama, MD       Or  . acetaminophen (TYLENOL) suppository 650 mg  650 mg Rectal Q6H PRN Venetia Maxon Rama, MD      . ALPRAZolam Duanne Moron) tablet 0.5 mg  0.5 mg Oral TID PRN Venetia Maxon Rama, MD   0.5 mg at 07/30/15 0921  . ergocalciferol (DRISDOL) 8000 UNIT/ML drops 8,000 Units  8,000 Units Oral Daily Venetia Maxon Rama, MD   8,000 Units at 07/30/15 1210  . HYDROcodone-acetaminophen (NORCO/VICODIN) 5-325 MG per tablet 1-2 tablet  1-2 tablet Oral Q4H PRN Verlee Monte, MD   2 tablet at 07/30/15 0921  . naproxen (NAPROSYN) tablet 250 mg  250 mg Oral BID PRN Venetia Maxon Rama, MD   250 mg at 07/29/15 [id number]  . ondansetron (ZOFRAN) tablet 4 mg  4 mg Oral Q6H PRN Venetia Maxon Rama, MD       Or  . ondansetron (ZOFRAN) injection 4 mg  4 mg Intravenous Q6H PRN Venetia Maxon Rama, MD   4 mg at 07/30/15 0223  . polyethylene glycol (MIRALAX / GLYCOLAX) packet 17 g  17 g Oral Daily PRN Venetia Maxon Rama, MD   17 g at 07/29/15 2347  . sodium chloride flush (NS) 0.9 % injection 3 mL  3 mL Intravenous Q12H Venetia Maxon Rama, MD   3 mL at 07/30/15 0922  . sodium chloride flush (NS) 0.9 % injection 3 mL  3 mL Intravenous PRN Venetia Maxon Rama, MD      . tuberculin injection 5 Units  5 Units Intradermal Once Knox Royalty, NP   5 Units at 07/29/15 1509      Discharge Medications: Please see discharge summary for a list of discharge medications.  Relevant Imaging Results:  Relevant Lab Results:   Additional Information SSN: [id number] Pt to have Hospice at ALF.   KIDD, SUZANNA A, LCSW

## 2015-07-30 NOTE — Progress Notes (Signed)
CSW continuing to follow.    CSW received notification from [facility] that facility unable to offer pt placement at ALF.   PT evaluated pt this morning and recommended SNF. CSW sent information to [facility] and [place] as these facilities will accept pt with Medicaid only. CSW contacted liaison with facilities who was willing to come speak with pt and pt ex husband regarding placement in order for pt concerns to be addressed. Pt is agreeable to SNF, but states that she wants to ensure that facility has her pain and anxiety medication at the facility before she transfers to the facility. Pt expressed anxiousness around transition to SNF and afraid of not being cared for at facility. Emotional counseling provided and assured pt that preparations are being made for her at the facility.   CSW received confirmation from [facility] that facility could accept pt. CSW sent discharge information to [facility] and facility arranged to get pt anxiety and pain medications to the facility before transport was arranged. CSW received confirmation from [facility] that facility has pt pain and anxiety medications present at the facility for pt arrival.   CSW met again with pt and pt ex husband to arrange for the transport to [facility] and [place]. Chaplain present and providing support during impending discharge. Questions were clarified and support provided.   CSW facilitated pt discharge needs including contacting facility, faxing pt discharge information via epic hub, discussing with pt and pt ex husband at bedside, ensuring pt questions and concerns were addressed, providing RN phone number to call report, and arranging ambulance transportation for pt to [facility].  No further social work needs identified at this time.  CSW signing off.   Alison Murray, MSW, [place] Work [phone]

## 2015-07-30 NOTE — Clinical Social Work Placement (Signed)
   CLINICAL SOCIAL WORK PLACEMENT  NOTE  Date:  07/30/2015  Patient Details  Name: Patricia Carter MRN: [id number] Date of Birth: 1955/06/28  Clinical Social Work is seeking post-discharge placement for this patient at the [place] level of care (*CSW will initial, date and re-position this form in  chart as items are completed):  Yes   Patient/family provided with [place] Work Department's list of facilities offering this level of care within the geographic area requested by the patient (or if unable, by the patient's family).  Yes   Patient/family informed of their freedom to choose among providers that offer the needed level of care, that participate in Medicare, Medicaid or managed care program needed by the patient, have an available bed and are willing to accept the patient.  Yes   Patient/family informed of [facility]'s ownership interest in [facility] and [facility], as well as of the fact that they are under no obligation to receive care at these facilities.  PASRR submitted to EDS on 07/30/15     PASRR number received on 07/30/15     Existing PASRR number confirmed on       FL2 transmitted to all facilities in geographic area requested by pt/family on 07/30/15     FL2 transmitted to all facilities within larger geographic area on       Patient informed that his/her managed care company has contracts with or will negotiate with certain facilities, including the following:        Yes   Patient/family informed of bed offers received.  Patient chooses bed at       Physician recommends and patient chooses bed at      Patient to be transferred to   on  .  Patient to be transferred to facility by       Patient family notified on   of transfer.  Name of family member notified:        PHYSICIAN Please sign FL2, Please sign DNR     Additional Comment:    _______________________________________________ Ladell Pier,  LCSW 07/30/2015, 3:29 PM

## 2015-07-30 NOTE — Progress Notes (Addendum)
Chaplain follow up.  Patricia Redner is anxious about her impending discharge to a nursing facility. In truth her fear is the fear of the unknown. LCSW Cristal Deer and the chaplain spent time with her assuring her that preparations have been made for her and the anticipatory grief may be for naught. Patricia Wojahn calmed as we talked to her, and she became more resolved to the fact that she is being discharged. The following is my assessment and recommended interventions for her future care:  Patricia Carter:   1) Is a [employer]. This is the filter she uses in all relations and all events in her life. If her faith is not taken into consideration, a caregiver may assess her as "distant, non-responsive, and waving off care." Based on her interpretation of scripture, she expects God to call those that need to help her without her prompting. She does not see this as testing God, but rather as depending on God. She sees caregivers who prefer not to stick with her as not in God's will for her. She feels that her redemption is based on her living her faith.   2) Is a person who practices her understanding of [place] humility. When asked how are you, she will answer "I'm okay." This is because she wishes to show her faith in God. This may be interpreted by some caregivers as not being interested in care. That would be unfortunate because she needs the care, but may not be able to communicate her need and acceptance because she is trying to be humble.   3) Is a person of low self-esteem. She struggles with her worthiness of the attention she is being given. If she asks and does not get a response, she will be angered, but she will not push for what she needs from those that can provide it. She is not a person who, if she feels the caregiver does not earnestly wish to help her, she will not follow-up. Ironically, if she is treated as a number rather than a person, with a name and a story, she will not follow  through with her consults.   4) Is a person who is desperately afraid of being alone. She panics at the hint of being left alone, not being visited and being placed in a place where she will be ignored. The panic spirals up quickly and she knows she must self-regulate the panic and have a spiritual care friend to talk you back to her baseline faith stance to trust in God. She recognizes she needs to self regulate as much as she can, but often will not because her panic gets people to come and be with her, thus she is not alone. She loves fellowship and companionship. Being in a group is important to her.   5) Is a person who is desperately afraid of being in pain. She panics in anticipation of pain, which amplifies the pain as it arrives. She is anticipating getting in a car and riding home, which has her ramping up her anxiety even before the actual pain appears. She, thus, will likely verbally fight the discharge she desires because it means she has to experience pain.   6) Has a very low tolerance for physical, mental and spiritual pain. As discussed above she will panic at the anticipation of pain, much more when the pain arrives.   7) Is an introvert. She gathers information, weighs the information and then makes a decision. This comes across to  some caregivers as being stand-offish. This also frustrates some caregivers because she takes time to answer questions. Her introversion is easily missed because of her need of community and fear of being alone.   8) Dislikes being medicated to the point she loses her ability to think. As an introvert this is frightening.   9) Is a person who does not trust caregivers that seem to not care about or for her. Her low-self esteem will not allow her to confront such impressions.  10) Is in deep spiritual pain. She was a physically fit person, who tried to do healthy things. When she had cancer she felt betrayed by God. When she prays and does not get what she wishes,  she feels God does not love her. She is in the midst of forming new relationship with God, which will require spiritual direction from a pastor, spiritual friend, church associate, or Clinical biochemist.   Intervention recommendations based on my professional assessments:   1) Placement services should include access to medical, mental and spiritual counseling.   2) Placement should include an environment of fellowship/companionship/relationship with others.   3) Caregivers should not take her initial standoffishness as her desires. Caregivers need to gently draw her first into a relationship, which in turn will move her toward taking the care she needs.   4) Once a relationship is established, caregivers must draw her forward in her care plan, even if she seems reluctant. This gentle drawing her forward is seen by her as relationship/fellowship.   5) Placement should have capabilities for physical, mental and spiritual pain management. In other words "wholistic care."  Sallee Lange. Kortni Hasten, DMin, MDiv Chaplain

## 2015-07-30 NOTE — Evaluation (Signed)
Physical Therapy Evaluation Patient Details Name: Patricia Carter MRN: [id number] DOB: [birth year] Today's Date: 07/30/2015   History of Present Illness  60 yo female admittd with pleural effusion. Hx of breat cancer, lung mass, anxiety. Pt is from home alone.  Clinical Impression  On eval, pt required Min assist for mobility. Pt initially requesting for PT to check back another time. Pt then agreeable to ambulate in room, to and from bathroom, with therapy. 1 HHA given for stability during ambulation. Pt c/o pain and dizziness during session. At this time, unsure if pt can safely and efficiently manage at home alone. Recommend SNF unless pt can arrange for assistance in home.     Follow Up Recommendations SNF;Supervision - Intermittent (unless pt wil have assistance available at home. )    Equipment Recommendations  None recommended by PT    Recommendations for Other Services       Precautions / Restrictions Precautions Precautions: Fall Restrictions Weight Bearing Restrictions: No      Mobility  Bed Mobility Overal bed mobility: Needs Assistance Bed Mobility: Supine to Sit;Sit to Supine     Supine to sit: HOB elevated;Min guard Sit to supine: Min guard   General bed mobility comments: close guard for safety. Increased time.   Transfers Overall transfer level: Needs assistance   Transfers: Sit to/from Stand Sit to Stand: Min assist         General transfer comment: small amount of assist to stabilize. Pt c/o dizziness.   Ambulation/Gait Ambulation/Gait assistance: Min assist Ambulation Distance (Feet): 15 Feet (x2) Assistive device: 1 person hand held assist Gait Pattern/deviations: Step-through pattern;Decreased stride length     General Gait Details: slow, guarded gait pattern. activity appears effortful.   Stairs            Wheelchair Mobility    Modified Rankin (Stroke Patients Only)       Balance Overall balance assessment: Needs assistance            Standing balance-Leahy Scale: Fair                               Pertinent Vitals/Pain Pain Assessment: 0-10 Pain Score: 8  Pain Location: chest, back Pain Intervention(s): Premedicated before session;Repositioned;Limited activity within patient's tolerance    Home Living Family/patient expects to be discharged to:: Private residence Living Arrangements: Alone   Type of Home: Apartment Home Access: Stairs to enter Entrance Stairs-Rails: Right Entrance Stairs-Number of Steps: 12 Home Layout: One level Home Equipment: None      Prior Function Level of Independence: Independent         Comments: friends/neighbors assisted "once in a while"-cleaning, supervision when bathing, laundry     Hand Dominance        Extremity/Trunk Assessment   Upper Extremity Assessment: Generalized weakness           Lower Extremity Assessment: Generalized weakness      Cervical / Trunk Assessment: Kyphotic (frail appearance)  Communication   Communication: No difficulties  Cognition Arousal/Alertness: Awake/alert Behavior During Therapy: Anxious Overall Cognitive Status: Within Functional Limits for tasks assessed                      General Comments      Exercises        Assessment/Plan    PT Assessment Patient needs continued PT services  PT Diagnosis Difficulty walking;Generalized weakness;Acute pain   PT Problem  List Decreased mobility;Decreased activity tolerance;Decreased balance;Pain;Decreased knowledge of use of DME;Decreased strength  PT Treatment Interventions Gait training;Functional mobility training;Therapeutic activities;Patient/family education;Balance training;Therapeutic exercise   PT Goals (Current goals can be found in the Care Plan section) Acute Rehab PT Goals Patient Stated Goal: less pain PT Goal Formulation: With patient Time For Goal Achievement: 08/13/15 Potential to Achieve Goals: Fair    Frequency Min  3X/week   Barriers to discharge        Co-evaluation               End of Session   Activity Tolerance: Patient limited by fatigue;Patient limited by pain Patient left: in bed;with call bell/phone within reach;with bed alarm set      Functional Assessment Tool Used: clinical judgement Functional Limitation: Mobility: Walking and moving around Mobility: Walking and Moving Around Current Status [id number]): At least 1 percent but less than 20 percent impaired, limited or restricted Mobility: Walking and Moving Around Goal Status [phone]): At least 1 percent but less than 20 percent impaired, limited or restricted    Time: 0927-0941 PT Time Calculation (min) (ACUTE ONLY): 14 min   Charges:   PT Evaluation $PT Eval Low Complexity: 1 Procedure     PT G Codes:   PT G-Codes **NOT FOR INPATIENT CLASS** Functional Assessment Tool Used: clinical judgement Functional Limitation: Mobility: Walking and moving around Mobility: Walking and Moving Around Current Status [id number]): At least 1 percent but less than 20 percent impaired, limited or restricted Mobility: Walking and Moving Around Goal Status [phone]): At least 1 percent but less than 20 percent impaired, limited or restricted    Weston Anna, MPT Pager: [phone]

## 2015-08-01 LAB — CULTURE, BODY FLUID-BOTTLE

## 2015-08-01 LAB — CULTURE, BODY FLUID W GRAM STAIN -BOTTLE: Culture: NO GROWTH

## 2015-08-02 ENCOUNTER — Other Ambulatory Visit: Payer: Self-pay | Admitting: Hematology

## 2015-08-03 ENCOUNTER — Encounter: Payer: Self-pay | Admitting: Internal Medicine

## 2015-08-03 ENCOUNTER — Emergency Department (HOSPITAL_COMMUNITY)
Admission: EM | Admit: 2015-08-03 | Discharge: 2015-08-04 | Disposition: A | Discharge status: Home / Self Care | Payer: Medicaid Other | Attending: Emergency Medicine | Admitting: Emergency Medicine

## 2015-08-03 ENCOUNTER — Non-Acute Institutional Stay (SKILLED_NURSING_FACILITY): Payer: Medicaid Other | Admitting: Internal Medicine

## 2015-08-03 ENCOUNTER — Encounter (HOSPITAL_COMMUNITY): Payer: Self-pay

## 2015-08-03 ENCOUNTER — Emergency Department (HOSPITAL_COMMUNITY): Payer: Medicaid Other

## 2015-08-03 DIAGNOSIS — K5900 Constipation, unspecified: Secondary | ICD-10-CM | POA: Diagnosis not present

## 2015-08-03 DIAGNOSIS — C7800 Secondary malignant neoplasm of unspecified lung: Secondary | ICD-10-CM

## 2015-08-03 DIAGNOSIS — Z79899 Other long term (current) drug therapy: Secondary | ICD-10-CM | POA: Insufficient documentation

## 2015-08-03 DIAGNOSIS — F419 Anxiety disorder, unspecified: Secondary | ICD-10-CM | POA: Diagnosis not present

## 2015-08-03 DIAGNOSIS — R0781 Pleurodynia: Secondary | ICD-10-CM | POA: Diagnosis not present

## 2015-08-03 DIAGNOSIS — G893 Neoplasm related pain (acute) (chronic): Secondary | ICD-10-CM | POA: Diagnosis not present

## 2015-08-03 DIAGNOSIS — Z515 Encounter for palliative care: Secondary | ICD-10-CM

## 2015-08-03 DIAGNOSIS — C50911 Malignant neoplasm of unspecified site of right female breast: Secondary | ICD-10-CM

## 2015-08-03 DIAGNOSIS — M791 Myalgia: Secondary | ICD-10-CM | POA: Diagnosis not present

## 2015-08-03 DIAGNOSIS — J90 Pleural effusion, not elsewhere classified: Secondary | ICD-10-CM | POA: Diagnosis not present

## 2015-08-03 DIAGNOSIS — Z853 Personal history of malignant neoplasm of breast: Secondary | ICD-10-CM | POA: Insufficient documentation

## 2015-08-03 DIAGNOSIS — F4323 Adjustment disorder with mixed anxiety and depressed mood: Secondary | ICD-10-CM

## 2015-08-03 DIAGNOSIS — R1010 Upper abdominal pain, unspecified: Secondary | ICD-10-CM

## 2015-08-03 DIAGNOSIS — Z7189 Other specified counseling: Secondary | ICD-10-CM | POA: Diagnosis not present

## 2015-08-03 DIAGNOSIS — K5909 Other constipation: Secondary | ICD-10-CM

## 2015-08-03 DIAGNOSIS — R079 Chest pain, unspecified: Secondary | ICD-10-CM

## 2015-08-03 DIAGNOSIS — R109 Unspecified abdominal pain: Secondary | ICD-10-CM

## 2015-08-03 MED ORDER — ONDANSETRON HCL 4 MG/2ML IJ SOLN
4.0000 mg | Freq: Once | INTRAMUSCULAR | Status: AC
  Administered 2015-08-03: 4 mg via INTRAVENOUS
  Filled 2015-08-03: qty 2

## 2015-08-03 MED ORDER — MORPHINE SULFATE (PF) 2 MG/ML IV SOLN
2.0000 mg | Freq: Once | INTRAVENOUS | Status: AC
  Administered 2015-08-03: 2 mg via INTRAVENOUS
  Filled 2015-08-03: qty 1

## 2015-08-03 NOTE — ED Notes (Signed)
Pt has RUQ abd pain, and CP, pt has hx fluid accumulations around the R lung, two weeks ago had thoracentesis and removed 1.5 L, pain feels the same.

## 2015-08-03 NOTE — ED Provider Notes (Signed)
CSN: [id number]     Arrival date & time 08/03/15  2241 History  By signing my name below, I, Helane Gunther, attest that this documentation has been prepared under the direction and in the presence of Orpah Greek, MD. Electronically Signed: Helane Gunther, ED Scribe. 08/03/2015. 11:29 PM.      Chief Complaint  Patient presents with  . Abdominal Pain  . Chest Pain   The history is provided by the patient. No language interpreter was used.   HPI Comments: Patricia Carter is a 60 y.o. female with a PMHx of right lung mass,breast cancer, and anxiety about health, as well as a PSHx of mastectomy and sub-xyphoid pericardial window who presents to the Emergency Department complaining of constant, progressively worsening chest pain and RUQ abdominal pain radiating around the back onset 1 month ago. Pt reports that her physician has told her in the past that she had fluid accumulation around her right lung, which she had to have removed twice in the past. She also notes she has not had a BM in the past 4 days. She notes she has an appointment with her oncologist, Dr Burr Medico, in 2 days. She states that another physician has suggested drainage tubes in the past, but that Dr Burr Medico did not believe that would be a good idea. She states she is currently at the [facility], where she is on 1 pill of Vicodin and 1 pill of Xanax. She notes that she was offered Zoloft in the past, which she refused to take. She reports associated nausea and generalized myalgias.  Pt is allergic to prednisone, oxycodone, omeprazole, Iopamidol, and Zyrtec.   Past Medical History  Diagnosis Date  . Breast cancer ([place])   . Lung mass   . Anxiety about health    Past Surgical History  Procedure Laterality Date  . Mastectomy Left     2010  . Eye surgery      cataract surgery, wears contact left eye  . Subxyphoid pericardial window N/A 05/05/2014    Procedure: SUBXYPHOID PERICARDIAL WINDOW;  Surgeon: Gaye Pollack,  MD;  Location: [facility] OR;  Service: Thoracic;  Laterality: N/A;  . Flexible bronchoscopy N/A 05/05/2014    Procedure: FLEXIBLE BRONCHOSCOPY;  Surgeon: Gaye Pollack, MD;  Location: [facility] OR;  Service: Thoracic;  Laterality: N/A;  . Video bronchoscopy with endobronchial ultrasound N/A 05/05/2014    Procedure: VIDEO BRONCHOSCOPY WITH ENDOBRONCHIAL ULTRASOUND;  Surgeon: Gaye Pollack, MD;  Location: [facility] OR;  Service: Thoracic;  Laterality: N/A;   Family History  Problem Relation Age of Onset  . Cancer Paternal Aunt     leukemia  . Cancer Father 48    prostate cancer    Social History  Substance Use Topics  . Smoking status: Never Smoker   . Smokeless tobacco: Never Used  . Alcohol Use: 0.0 oz/week    0 Standard drinks or equivalent per week     Comment: occ   OB History    No data available     Review of Systems  Cardiovascular: Positive for chest pain.  Gastrointestinal: Positive for nausea, abdominal pain and constipation.  Musculoskeletal: Positive for myalgias.  All other systems reviewed and are negative.   Allergies  Iopamidol; Omeprazole; Oxycodone; Prednisone; and Zyrtec  Home Medications   Prior to Admission medications   Medication Sig Start Date End Date Taking? Authorizing Provider  ALPRAZolam Duanne Moron) 0.5 MG tablet Take 1 mg by mouth every 8 (eight) hours as needed  for anxiety.   Yes Historical Provider, MD  alum & mag hydroxide-simeth (MYLANTA) 200-200-20 MG/5ML suspension Take 30 mLs by mouth every 4 (four) hours as needed for indigestion.   Yes Historical Provider, MD  ergocalciferol (DRISDOL) 8000 UNIT/ML drops Take 8,000 Units by mouth daily.   Yes Historical Provider, MD  HYDROcodone-acetaminophen (NORCO/VICODIN) 5-325 MG tablet Take 1 tablet by mouth every 4 (four) hours as needed for moderate pain.   Yes Historical Provider, MD  sertraline (ZOLOFT) 25 MG tablet Take 1 tablet (25 mg total) by mouth daily. 07/30/15  Yes Verlee Monte, MD  traMADol (ULTRAM) 50 MG tablet  Take 1 tablet (50 mg total) by mouth every 6 (six) hours as needed for moderate pain or severe pain. 07/30/15  Yes Verlee Monte, MD  ALPRAZolam Duanne Moron) 0.5 MG tablet Take 1-2 tablets (0.5-1 mg total) by mouth 3 (three) times daily as needed for anxiety. Take 1-2 tabs PO TID PRN anxiety. Patient not taking: Reported on 08/04/2015 07/30/15   Verlee Monte, MD  HYDROcodone-acetaminophen (NORCO/VICODIN) 5-325 MG tablet Take 1-2 tablets by mouth every 4 (four) hours as needed for moderate pain. Patient not taking: Reported on 08/04/2015 07/30/15   Verlee Monte, MD   BP 136/84 mmHg  Pulse 105  Temp(Src) 98.3 F (36.8 C) (Oral)  Resp 24  Ht 5\' 3"  (1.6 m)  Wt 98 lb (44.453 kg)  BMI 17.36 kg/m2  SpO2 94% Physical Exam  Constitutional: She is oriented to person, place, and time. She appears well-developed and well-nourished. No distress.  Pt appears anxious and cachectic  HENT:  Head: Normocephalic and atraumatic.  Right Ear: Hearing normal.  Left Ear: Hearing normal.  Nose: Nose normal.  Mouth/Throat: Oropharynx is clear and moist and mucous membranes are normal.  Eyes: Conjunctivae and EOM are normal. Pupils are equal, round, and reactive to light.  Neck: Normal range of motion. Neck supple.  Cardiovascular: Regular rhythm, S1 normal and S2 normal.  Exam reveals no gallop and no friction rub.   No murmur heard. Pulmonary/Chest: Effort normal. No respiratory distress. She exhibits no tenderness.  Decreased breath sounds in R mid and R lower lung fields  Abdominal: Soft. Normal appearance and bowel sounds are normal. There is no hepatosplenomegaly. There is tenderness (mild, diffuse). There is no rebound, no guarding, no tenderness at McBurney's point and negative Murphy's sign. No hernia.  Musculoskeletal: Normal range of motion.  Neurological: She is alert and oriented to person, place, and time. She has normal strength. No cranial nerve deficit or sensory deficit. Coordination normal. GCS eye  subscore is 4. GCS verbal subscore is 5. GCS motor subscore is 6.  Skin: Skin is warm, dry and intact. No rash noted. No cyanosis.  Psychiatric: She has a normal mood and affect. Her speech is normal and behavior is normal. Thought content normal.  Nursing note and vitals reviewed.   ED Course  Procedures  DIAGNOSTIC STUDIES: Oxygen Saturation is 98% on RA, normal by my interpretation.    COORDINATION OF CARE: 11:23 PM - Discussed chest XR results and plans to wait on diagnostic studies and further imaging. Will order something for pain. Pt advised of plan for treatment and pt agrees.  Labs Review Labs Reviewed  COMPREHENSIVE METABOLIC PANEL - Abnormal; Notable for the following:    Chloride 98 (*)    Total Protein 6.4 (*)    Albumin 3.3 (*)    AST 42 (*)    All other components within normal limits  URINALYSIS, ROUTINE  W REFLEX MICROSCOPIC (NOT AT [facility]) - Abnormal; Notable for the following:    APPearance CLOUDY (*)    Ketones, ur 15 (*)    All other components within normal limits  LIPASE, BLOOD  CBC  I-STAT TROPOININ, ED    Imaging Review Dg Chest 2 View  08/03/2015  CLINICAL DATA:  Chest pain. EXAM: CHEST  2 VIEW COMPARISON:  Most recent radiograph 07/27/2015. Chest CT 06/12/2015. FINDINGS: Large right pleural effusion occupying the lower 2/3 of right hemithorax, mild increase in size compared to prior. Small nodular opacities in the aerated right upper lobe again seen. No pneumothorax. Left lung is clear. Cardiomediastinal contours are unchanged. Osseous structures are unchanged. IMPRESSION: Increased size large right pleural effusion. Nodular opacities in the aerated right upper lung again seen. Electronically Signed   By: Jeb Levering M.D.   On: 08/03/2015 23:36   Ct Abdomen Pelvis W Contrast  08/04/2015  CLINICAL DATA:  Metastatic breast carcinoma with right lower chest and abdominal pain. Nausea and constipation EXAM: CT ABDOMEN AND PELVIS WITH CONTRAST TECHNIQUE:  Multidetector CT imaging of the abdomen and pelvis was performed using the standard protocol following bolus administration of intravenous contrast. CONTRAST:  75 mL ISOVUE-300 IOPAMIDOL (ISOVUE-300) INJECTION 61% COMPARISON:  December 09, 2014; May 23, 2015 FINDINGS: Lower chest: There is extensive pleural effusion in the right base with a sizable subpulmonic component. There is airspace consolidation in the right lower lobe and portions of the right middle lobe as well as scarring in portions of the right middle lobe. Visualized left lung is clear. Breast implants are noted bilaterally. Hepatobiliary: No focal liver lesions are evident. There is fatty infiltration near the fissure for the ligamentum teres. Gallbladder wall is not appreciably thickened. There is no biliary duct dilatation. Pancreas: No pancreatic mass or inflammatory focus. Spleen: No splenic lesions evident. Adrenals/Urinary Tract: Adrenals appear normal bilaterally. The right kidney is somewhat inferior and anterior in location, a change from prior studies. There is no renal mass or hydronephrosis on either side. No renal or ureteral calculus on either side. Urinary bladder is midline with wall thickness within normal limits. Stomach/Bowel: There is moderate stool throughout the colon. The rectum is somewhat distended with stool. The rectal wall is not thickened. There is no bowel wall or mesenteric thickening. No bowel obstruction. No free air or portal venous air. Vascular/Lymphatic: There is atherosclerotic calcification in the aorta and proximal iliac arteries. There is no abdominal aortic aneurysm. The major mesenteric vessels appear patent. There is no demonstrable adenopathy in the abdomen or pelvis. Reproductive: No pelvic mass or pelvic fluid collection is apparent. Other: There is no periappendiceal region inflammation. No abscess or ascites evident in the abdomen or pelvis. Musculoskeletal: No blastic or lytic bone lesions are  evident on the current examination. There is no intramuscular or abdominal wall lesion. IMPRESSION: Sizable right pleural effusion with significant subpulmonic component. Areas of consolidation in the right middle and lower lobe regions with scarring in the right middle lobe. Ptotic right kidney without hydronephrosis or renal mass. Stool throughout colon. Rectum is distended with stool. The rectal wall does not appear appreciably thickened. No bowel obstruction.  No abscess. No intra-abdominal or pelvic metastatic foci evident. Electronically Signed   By: Lowella Grip III M.D.   On: 08/04/2015 07:49   Dg Abd 2 Views  08/04/2015  CLINICAL DATA:  Left lower quadrant abdominal pain EXAM: ABDOMEN - 2 VIEW COMPARISON:  06/12/2015 FINDINGS: The abdominal gas pattern is negative for  obstruction or perforation. No biliary or urinary calculi are evident. The upright view of the chest demonstrates a large right pleural effusion which is increased since 06/12/2015. There is consolidation of right lung parenchyma adjacent to the large pleural fluid collection. The left lung is clear. IMPRESSION: 1. Normal abdominal gas pattern. 2. Increasing right pleural effusion. Electronically Signed   By: Andreas Newport M.D.   On: 08/04/2015 00:24   I have personally reviewed and evaluated these images and lab results as part of my medical decision-making.   EKG Interpretation   Date/Time:  Tuesday August 03 2015 22:56:23 EDT Ventricular Rate:  99 PR Interval:  128 QRS Duration: 94 QT Interval:  397 QTC Calculation: 509 R Axis:   89 Text Interpretation:  Sinus rhythm Probable left atrial enlargement  Borderline right axis deviation Low voltage, precordial leads Nonspecific  T abnormalities, lateral leads Prolonged QT interval No significant change  since last tracing Confirmed by Mikaela Hilgeman  MD, Zoltan Genest [id number]) on  08/03/2015 11:01:12 PM      MDM   Final diagnoses:  Abdominal pain  Chest pain, unspecified  chest pain type  Pleural effusion    Patient is experiencing chest pain and abdomen pain patient has a history of breast cancer that has spread to her right lung. She has a pleural effusion that has been drained multiple times. Reviewing her oncology notes reveals that she has not been significantly short of breath and therefore Pleurx has not been recommended to this point. I suspect that the majority of her pain is coming from this effusion and a known cancer. She feels like she is constipated, however, plain film x-ray does not show any significant constipation or abnormality. She has not had a scan of her abdomen since January of this year. Reviewing her records reveals a history of allergy to Iopamidol. Patient reports that she is unaware of how this got into her chart. She has never had an allergy to IV dye. She has had multiple CT scans here with IV dye without any allergy reaction. CT of abdomen and pelvis with contrast was performed. No acute abnormality is noted other than stool throughout colon. She will require bowel regimen, will prescribe MiraLAX.  Looking to the patient's MAR, she has Ultram and Vicodin to be used when necessary. It appears that her nurse is at the rehabilitation center have not been giving her the full dose. She has continuous pain I believe she would benefit from a longer acting pain medication and then only rely on the Vicodin for breakthrough pain. She is to discuss this with her oncologist, Dr. Burr Medico, at her appointment tomorrow.  I personally performed the services described in this documentation, which was scribed in my presence. The recorded information has been reviewed and is accurate.   Orpah Greek, MD 08/04/15 [phone]

## 2015-08-03 NOTE — ED Notes (Signed)
Pt comes from [facility].

## 2015-08-03 NOTE — Progress Notes (Signed)
Patient ID: Patricia Carter, female   DOB: Aug 12, 1955, 60 y.o.   MRN: [id number]    HISTORY AND PHYSICAL   DATE: 08/03/15  Location:  [facility]    Place of Service: SNF [phone])   Extended Emergency Contact Information Primary Emergency Contact: Notarmaso,Mariel Address: [place], [place] Phone: [phone] Mobile Phone: [phone] Relation: Daughter Secondary Emergency Contact: Cleon Gustin [place] Phone: [phone] Mobile Phone: [phone] Relation: Friend  Advanced Directive information  FULL CODE  Chief Complaint  Patient presents with  . 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 In past year, she has underwent palliative XRT and completed 14 tx. In 03/2014, she was evaluated by pulmonology and a CT of the chest was obtained which showed a right hilar mass with compression on the SVC and moderate pericardial effusion. 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 She  underwent bronchoscopy and pericardial window 05/01/14. She is followed by H/O and has taken anti-estrogen oral tx + chemotx but was unable to tolerate either. Bone scan in Aug 2016 showed increased uptake in left 5th and right 6th ribs. In March 2017, She developed worsening substernal CP and dysphagia. She underwent right thoracentesis on 07/23/15 with 1.2L removed, cytology neg. She continued to have right sided CP and presented to the ED. CXR revealed large right pleural effusion with consolidation of right mid/lower lung. Right thoracentesis done on 3/28th  removed only 0.65L of serosanguinous fluid that had only reactive cells. Bronchoscopy in 01/2015 (+) for malignant cells. Oncology Dr Burr Medico recommended palliative care/hospice. Xanax rx for anxiety/panic attacks. vicodin added for pain  Today she is c/a medications. She does not want to take sertraline but ok with prn xanax. She reports constipation and no BM x 4 days. 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 She takes norco and tramadol for pain. Pain overall controlled. Tolerating PT but does have increased pain and anxiety when ambulating. She takes pain med prior to therapy which helps. No nursing issues. No falls.    Past Medical History  Diagnosis Date  . Breast cancer ([place])   . Lung mass   . Anxiety about health     Past Surgical History  Procedure Laterality Date  . Mastectomy Left     2010  . Eye surgery      cataract surgery, wears contact left eye  . Subxyphoid pericardial window N/A 05/05/2014    Procedure: SUBXYPHOID PERICARDIAL WINDOW;  Surgeon: Gaye Pollack, MD;  Location: [place];  Service: Thoracic;  Laterality: N/A;  . Flexible bronchoscopy N/A 05/05/2014    Procedure: FLEXIBLE BRONCHOSCOPY;  Surgeon: Gaye Pollack, MD;  Location: [facility] OR;  Service: Thoracic;  Laterality: N/A;  . Video bronchoscopy with endobronchial ultrasound N/A 05/05/2014    Procedure: VIDEO BRONCHOSCOPY WITH ENDOBRONCHIAL ULTRASOUND;  Surgeon: Gaye Pollack, MD;  Location: [place];  Service: Thoracic;  Laterality: N/A;    Patient Care Team: Gearlean Alf, PA-C as PCP - General (Physician  Assistant) [facility] Tanda Rockers, MD as Consulting Physician (Pulmonary Disease)  Social History   Social History  . Marital Status: Single    Spouse Name: N/A  . Number of Children: 1  . Years of Education: N/A   Occupational History  . Works PT reception    Social History Main Topics  . Smoking status: Never Smoker   . Smokeless tobacco: Never Used  .  Alcohol Use: 0.0 oz/week    0 Standard drinks or equivalent per week     Comment: occ  . Drug Use: No  . Sexual Activity: Not Currently   Other Topics Concern  . Not on file   Social History Narrative   Divorced.  Lives alone.     reports that she has never smoked. She has never used smokeless tobacco. She reports that she drinks alcohol. She reports that she does not use illicit drugs.  Family History  Problem Relation Age of Onset  . Cancer Paternal Aunt     leukemia  . Cancer Father 69    prostate cancer    No family status information on file.    Immunization History  Administered Date(s) Administered  . Influenza,inj,Quad PF,36+ Mos 01/26/2015  . PPD Test 07/29/2015    Allergies  Allergen Reactions  . Iopamidol Hives    Uncoded Allergy. Allergen: DIZIPROMINE, Other Reaction: OTHER REACTION  . Omeprazole Other (See Comments)    Abdominal cramps and constipation   . Oxycodone Other (See Comments)    Migraines and dizziness   . Prednisone Palpitations  . Zyrtec [Cetirizine] Palpitations    Medications: Patient's Medications  New Prescriptions   No medications on file  Previous Medications   ALPRAZOLAM (XANAX) 0.5 MG TABLET    Take 1-2 tablets (0.5-1 mg total) by mouth 3 (three) times daily as needed for anxiety. Take 1-2 tabs PO TID PRN anxiety.   ALUM & MAG HYDROXIDE-SIMETH (MYLANTA) 559-741-63 MG/5ML SUSPENSION    Take 30 mLs by mouth every 4 (four) hours as needed for indigestion.   ERGOCALCIFEROL (DRISDOL) 8000 UNIT/ML DROPS    Take 8,000 Units by mouth daily.   HYDROCODONE-ACETAMINOPHEN (NORCO/VICODIN) 5-325 MG TABLET    Take 1-2 tablets by mouth every 4 (four) hours as needed for moderate pain.   SERTRALINE (ZOLOFT) 25 MG TABLET    Take 1 tablet (25 mg total) by mouth daily.   TRAMADOL (ULTRAM) 50 MG TABLET    Take 1 tablet (50 mg total) by mouth every 6 (six) hours as needed for moderate pain or severe pain.  Modified Medications   No medications on  file  Discontinued Medications   No medications on file    Review of Systems  Gastrointestinal: Positive for constipation.  Musculoskeletal: Positive for back pain, arthralgias and gait problem.  Psychiatric/Behavioral: The patient is nervous/anxious.     Filed Vitals:   08/03/15 1120  BP: 130/70  Pulse: 90  Temp: 98.1 F (36.7 C)  Weight: 98 lb (44.453 kg)  SpO2: 98%   Body mass index is 17.36 kg/(m^2).  Physical Exam  Constitutional: She is oriented to person, place, and time. She appears well-developed.  Frail appearing, lying in bed in NAD. anxious  HENT:  Mouth/Throat: Oropharynx is clear and moist. No oropharyngeal exudate.  MMM  Eyes: Pupils are equal, round, and reactive to light. No scleral icterus.  Neck: Neck supple. Carotid bruit is not present. No tracheal deviation present. No thyromegaly present.  Cardiovascular: Regular rhythm, normal  heart sounds and intact distal pulses.  Tachycardia present.  Exam reveals no gallop and no friction rub.   No murmur heard. No LE edema b/l. no calf TTP.   Pulmonary/Chest: Effort normal. No stridor. No respiratory distress. She has no wheezes. She has no rales. She exhibits tenderness (right lateral CW).  Reduced BS at base R>L  Abdominal: Soft. Bowel sounds are normal. She exhibits distension. She exhibits no mass. There is no hepatomegaly. There is tenderness (RUQ). There is no rebound and no guarding.  Musculoskeletal: She exhibits tenderness.  Lymphadenopathy:    She has no cervical adenopathy.  Neurological: She is alert and oriented to person, place, and time.  Skin: Skin is warm and dry. No rash noted.  Psychiatric: Her behavior is normal. Thought content normal. Her mood appears anxious.     Labs reviewed: Admission on 07/27/2015, Discharged on 07/30/2015  Component Date Value Ref Range Status  . Sodium 07/27/2015 134* 135 - 145 mmol/L Final  . Potassium 07/27/2015 3.8  3.5 - 5.1 mmol/L Final  . Chloride  07/27/2015 97* 101 - 111 mmol/L Final  . CO2 07/27/2015 27  22 - 32 mmol/L Final  . Glucose, Bld 07/27/2015 92  65 - 99 mg/dL Final  . BUN 07/27/2015 17  6 - 20 mg/dL Final  . Creatinine, Ser 07/27/2015 0.67  0.44 - 1.00 mg/dL Final  . Calcium 07/27/2015 9.3  8.9 - 10.3 mg/dL Final  . Total Protein 07/27/2015 6.4* 6.5 - 8.1 g/dL Final  . Albumin 07/27/2015 3.5  3.5 - 5.0 g/dL Final  . AST 07/27/2015 26  15 - 41 U/L Final  . ALT 07/27/2015 15  14 - 54 U/L Final  . Alkaline Phosphatase 07/27/2015 62  38 - 126 U/L Final  . Total Bilirubin 07/27/2015 0.7  0.3 - 1.2 mg/dL Final  . GFR calc non Af Amer 07/27/2015 >60  >60 mL/min Final  . GFR calc Af Amer 07/27/2015 >60  >60 mL/min Final   Comment: (NOTE) The eGFR has been calculated using the CKD EPI equation. This calculation has not been validated in all clinical situations. eGFR's persistently <60 mL/min signify possible Chronic Kidney Disease.   . Anion gap 07/27/2015 10  5 - 15 Final  . Magnesium 07/27/2015 1.9  1.7 - 2.4 mg/dL Final  . Phosphorus 07/27/2015 2.7  2.5 - 4.6 mg/dL Final  . WBC 07/27/2015 4.0  4.0 - 10.5 K/uL Final  . RBC 07/27/2015 4.76  3.87 - 5.11 MIL/uL Final  . Hemoglobin 07/27/2015 13.3  12.0 - 15.0 g/dL Final  . HCT 07/27/2015 38.5  36.0 - 46.0 % Final  . MCV 07/27/2015 80.9  78.0 - 100.0 fL Final  . MCH 07/27/2015 27.9  26.0 - 34.0 pg Final  . MCHC 07/27/2015 34.5  30.0 - 36.0 g/dL Final  . RDW 07/27/2015 12.6  11.5 - 15.5 % Final  . Platelets 07/27/2015 281  150 - 400 K/uL Final  . Neutrophils Relative % 07/27/2015 73   Final  . Neutro Abs 07/27/2015 2.9  1.7 - 7.7 K/uL Final  . Lymphocytes Relative 07/27/2015 15   Final  . Lymphs Abs 07/27/2015 0.6* 0.7 - 4.0 K/uL Final  . Monocytes Relative 07/27/2015 12   Final  . Monocytes Absolute 07/27/2015 0.5  0.1 - 1.0 K/uL Final  . Eosinophils Relative 07/27/2015 0   Final  . Eosinophils Absolute 07/27/2015 0.0  0.0 - 0.7 K/uL Final  . Basophils Relative  07/27/2015 0   Final  .  Basophils Absolute 07/27/2015 0.0  0.0 - 0.1 K/uL Final  . D-Dimer, Quant 07/27/2015 1.54* 0.00 - 0.50 ug/mL-FEU Final   Comment: (NOTE) At the manufacturer cut-off of 0.50 ug/mL FEU, this assay has been documented to exclude PE with a sensitivity and negative predictive value of 97 to 99%.  At this time, this assay has not been approved by the FDA to exclude DVT/VTE. Results should be correlated with clinical presentation.   . Fluid Type-FCT 07/27/2015 PLEURAL   Final  . Color, Fluid 07/27/2015 RED* YELLOW Final  . Appearance, Fluid 07/27/2015 CLOUDY* CLEAR Final  . WBC, Fluid 07/27/2015 477  0 - 1000 cu mm Final  . Neutrophil Count, Fluid 07/27/2015 0  0 - 25 % Final  . Lymphs, Fluid 07/27/2015 54   Final  . Monocyte-Macrophage-Serous Fluid 07/27/2015 46* 50 - 90 % Final  . Eos, Fluid 07/27/2015 0   Final  . Other Cells, Fluid 07/27/2015 CORRELATE WITH CYTOLOGY.   Final  . LD, Fluid 07/27/2015 1330* 3 - 23 U/L Final   Comment: (NOTE) Results should be evaluated in conjunction with serum values   . Fluid Type-FLDH 07/27/2015 PLEURAL   Final   Performed at [facility]  . Specimen Description 07/27/2015 FLUID PLEURAL   Final  . Special Requests 07/27/2015 BOTTLES DRAWN AEROBIC AND ANAEROBIC 5CC   Final  . Culture 07/27/2015    Final                   Value:NO GROWTH 5 DAYS Performed at [facility]   . Report Status 07/27/2015 08/01/2015 FINAL   Final  . Specimen Description 07/27/2015 FLUID PLEURAL   Final  . Special Requests 07/27/2015 NONE   Final  . Gram Stain 07/27/2015    Final                   Value:MODERATE WBC PRESENT, PREDOMINANTLY MONONUCLEAR NO ORGANISMS SEEN Performed at [facility]   . Report Status 07/27/2015 07/27/2015 FINAL   Final  Appointment on 07/19/2015  Component Date Value Ref Range Status  . WBC 07/19/2015 4.9  3.9 - 10.3 10e3/uL Final  . NEUT# 07/19/2015 3.6  1.5 - 6.5 10e3/uL Final  . HGB 07/19/2015  13.7  11.6 - 15.9 g/dL Final  . HCT 07/19/2015 41.6  34.8 - 46.6 % Final  . Platelets 07/19/2015 290  145 - 400 10e3/uL Final  . MCV 07/19/2015 84.4  79.5 - 101.0 fL Final  . MCH 07/19/2015 27.8  25.1 - 34.0 pg Final  . MCHC 07/19/2015 33.0  31.5 - 36.0 g/dL Final  . RBC 07/19/2015 4.93  3.70 - 5.45 10e6/uL Final  . RDW 07/19/2015 13.1  11.2 - 14.5 % Final  . lymph# 07/19/2015 0.5* 0.9 - 3.3 10e3/uL Final  . MONO# 07/19/2015 0.8  0.1 - 0.9 10e3/uL Final  . Eosinophils Absolute 07/19/2015 0.0  0.0 - 0.5 10e3/uL Final  . Basophils Absolute 07/19/2015 0.0  0.0 - 0.1 10e3/uL Final  . NEUT% 07/19/2015 73.2  38.4 - 76.8 % Final  . LYMPH% 07/19/2015 10.4* 14.0 - 49.7 % Final  . MONO% 07/19/2015 15.7* 0.0 - 14.0 % Final  . EOS% 07/19/2015 0.3  0.0 - 7.0 % Final  . BASO% 07/19/2015 0.4  0.0 - 2.0 % Final  . Sodium 07/19/2015 137  136 - 145 mEq/L Final  . Potassium 07/19/2015 4.0  3.5 - 5.1 mEq/L Final  . Chloride 07/19/2015 98  98 - 109  mEq/L Final  . CO2 07/19/2015 28  22 - 29 mEq/L Final  . Glucose 07/19/2015 113  70 - 140 mg/dl Final   Glucose reference range is for nonfasting patients. Fasting glucose reference range is 70- 100.  Marland Kitchen BUN 07/19/2015 14.1  7.0 - 26.0 mg/dL Final  . Creatinine 07/19/2015 1.0  0.6 - 1.1 mg/dL Final  . Total Bilirubin 07/19/2015 0.50  0.20 - 1.20 mg/dL Final  . Alkaline Phosphatase 07/19/2015 72  40 - 150 U/L Final  . AST 07/19/2015 27  5 - 34 U/L Final  . ALT 07/19/2015 15  0 - 55 U/L Final  . Total Protein 07/19/2015 7.5  6.4 - 8.3 g/dL Final  . Albumin 07/19/2015 4.0  3.5 - 5.0 g/dL Final  . Calcium 07/19/2015 10.0  8.4 - 10.4 mg/dL Final  . Anion Gap 07/19/2015 10  3 - 11 mEq/L Final  . EGFR 07/19/2015 63* >90 ml/min/1.73 m2 Final   eGFR is calculated using the CKD-EPI Creatinine Equation (2009)  Admission on 07/17/2015, Discharged on 07/17/2015  Component Date Value Ref Range Status  . Troponin i, poc 07/17/2015 0.02  0.00 - 0.08 ng/mL Final  . Comment  3 07/17/2015          Final   Comment: Due to the release kinetics of cTnI, a negative result within the first hours of the onset of symptoms does not rule out myocardial infarction with certainty. If myocardial infarction is still suspected, repeat the test at appropriate intervals.   Admission on 06/12/2015, Discharged on 06/14/2015  Component Date Value Ref Range Status  . Lipase 06/12/2015 24  11 - 51 U/L Final  . Sodium 06/12/2015 143  135 - 145 mmol/L Final  . Potassium 06/12/2015 3.8  3.5 - 5.1 mmol/L Final  . Chloride 06/12/2015 105  101 - 111 mmol/L Final  . CO2 06/12/2015 26  22 - 32 mmol/L Final  . Glucose, Bld 06/12/2015 97  65 - 99 mg/dL Final  . BUN 06/12/2015 11  6 - 20 mg/dL Final  . Creatinine, Ser 06/12/2015 0.81  0.44 - 1.00 mg/dL Final  . Calcium 06/12/2015 9.6  8.9 - 10.3 mg/dL Final  . Total Protein 06/12/2015 6.9  6.5 - 8.1 g/dL Final  . Albumin 06/12/2015 4.4  3.5 - 5.0 g/dL Final  . AST 06/12/2015 21  15 - 41 U/L Final  . ALT 06/12/2015 11* 14 - 54 U/L Final  . Alkaline Phosphatase 06/12/2015 78  38 - 126 U/L Final  . Total Bilirubin 06/12/2015 0.4  0.3 - 1.2 mg/dL Final  . GFR calc non Af Amer 06/12/2015 >60  >60 mL/min Final  . GFR calc Af Amer 06/12/2015 >60  >60 mL/min Final   Comment: (NOTE) The eGFR has been calculated using the CKD EPI equation. This calculation has not been validated in all clinical situations. eGFR's persistently <60 mL/min signify possible Chronic Kidney Disease.   . Anion gap 06/12/2015 12  5 - 15 Final  . WBC 06/12/2015 4.7  4.0 - 10.5 K/uL Final  . RBC 06/12/2015 4.78  3.87 - 5.11 MIL/uL Final  . Hemoglobin 06/12/2015 13.8  12.0 - 15.0 g/dL Final  . HCT 06/12/2015 40.9  36.0 - 46.0 % Final  . MCV 06/12/2015 85.6  78.0 - 100.0 fL Final  . MCH 06/12/2015 28.9  26.0 - 34.0 pg Final  . MCHC 06/12/2015 33.7  30.0 - 36.0 g/dL Final  . RDW 06/12/2015 13.0  11.5 - 15.5 %  Final  . Platelets 06/12/2015 249  150 - 400 K/uL Final    . Color, Urine 06/12/2015 YELLOW  YELLOW Final  . APPearance 06/12/2015 CLEAR  CLEAR Final  . Specific Gravity, Urine 06/12/2015 1.010  1.005 - 1.030 Final  . pH 06/12/2015 7.5  5.0 - 8.0 Final  . Glucose, UA 06/12/2015 NEGATIVE  NEGATIVE mg/dL Final  . Hgb urine dipstick 06/12/2015 NEGATIVE  NEGATIVE Final  . Bilirubin Urine 06/12/2015 NEGATIVE  NEGATIVE Final  . Ketones, ur 06/12/2015 NEGATIVE  NEGATIVE mg/dL Final  . Protein, ur 06/12/2015 NEGATIVE  NEGATIVE mg/dL Final  . Nitrite 06/12/2015 NEGATIVE  NEGATIVE Final  . Leukocytes, UA 06/12/2015 NEGATIVE  NEGATIVE Final   MICROSCOPIC NOT DONE ON URINES WITH NEGATIVE PROTEIN, BLOOD, LEUKOCYTES, NITRITE, OR GLUCOSE <1000 mg/dL.  . Strep Pneumo Urinary Antigen 06/12/2015 NEGATIVE  NEGATIVE Final   Comment:        Infection due to S. pneumoniae cannot be absolutely ruled out since the antigen present may be below the detection limit of the test. Performed at [facility]   . Specimen Description 06/12/2015 URINE, CLEAN CATCH   Final  . Special Requests 06/12/2015 NONE   Final  . Legionella Antigen, Urine 06/12/2015    Final                   Value:Negative for Legionella pneumophila serogroup 1                                                              Legionella pneumophila serogroup 1 antigen can be detected in urine within 2 to 3 days of infection and may persist even after treatment. This  assay does not detect other Legionella species or serogroups. Performed at [facility]   . Report Status 06/12/2015 06/14/2015 FINAL   Final  . WBC 06/13/2015 4.1  4.0 - 10.5 K/uL Final  . RBC 06/13/2015 4.31  3.87 - 5.11 MIL/uL Final  . Hemoglobin 06/13/2015 12.0  12.0 - 15.0 g/dL Final  . HCT 06/13/2015 37.4  36.0 - 46.0 % Final  . MCV 06/13/2015 86.8  78.0 - 100.0 fL Final  . MCH 06/13/2015 27.8  26.0 - 34.0 pg Final  . MCHC 06/13/2015 32.1  30.0 - 36.0 g/dL Final  . RDW 06/13/2015 13.1  11.5 - 15.5 % Final  . Platelets  06/13/2015 226  150 - 400 K/uL Final  . Sodium 06/13/2015 138  135 - 145 mmol/L Final  . Potassium 06/13/2015 4.3  3.5 - 5.1 mmol/L Final  . Chloride 06/13/2015 106  101 - 111 mmol/L Final  . CO2 06/13/2015 25  22 - 32 mmol/L Final  . Glucose, Bld 06/13/2015 94  65 - 99 mg/dL Final  . BUN 06/13/2015 7  6 - 20 mg/dL Final  . Creatinine, Ser 06/13/2015 0.67  0.44 - 1.00 mg/dL Final  . Calcium 06/13/2015 8.6* 8.9 - 10.3 mg/dL Final  . GFR calc non Af Amer 06/13/2015 >60  >60 mL/min Final  . GFR calc Af Amer 06/13/2015 >60  >60 mL/min Final   Comment: (NOTE) The eGFR has been calculated using the CKD EPI equation. This calculation has not been validated in all clinical situations. eGFR's persistently <60 mL/min signify possible Chronic Kidney Disease.   Marland Kitchen  Anion gap 06/13/2015 7  5 - 15 Final  Appointment on 06/01/2015  Component Date Value Ref Range Status  . WBC 06/01/2015 4.8  3.9 - 10.3 10e3/uL Final  . NEUT# 06/01/2015 3.4  1.5 - 6.5 10e3/uL Final  . HGB 06/01/2015 14.4  11.6 - 15.9 g/dL Final  . HCT 06/01/2015 42.2  34.8 - 46.6 % Final  . Platelets 06/01/2015 244  145 - 400 10e3/uL Final  . MCV 06/01/2015 84.2  79.5 - 101.0 fL Final  . MCH 06/01/2015 28.7  25.1 - 34.0 pg Final  . MCHC 06/01/2015 34.1  31.5 - 36.0 g/dL Final  . RBC 06/01/2015 5.01  3.70 - 5.45 10e6/uL Final  . RDW 06/01/2015 12.9  11.2 - 14.5 % Final  . lymph# 06/01/2015 0.6* 0.9 - 3.3 10e3/uL Final  . MONO# 06/01/2015 0.8  0.1 - 0.9 10e3/uL Final  . Eosinophils Absolute 06/01/2015 0.1  0.0 - 0.5 10e3/uL Final  . Basophils Absolute 06/01/2015 0.0  0.0 - 0.1 10e3/uL Final  . NEUT% 06/01/2015 71.2  38.4 - 76.8 % Final  . LYMPH% 06/01/2015 11.4* 14.0 - 49.7 % Final  . MONO% 06/01/2015 16.0* 0.0 - 14.0 % Final  . EOS% 06/01/2015 1.2  0.0 - 7.0 % Final  . BASO% 06/01/2015 0.2  0.0 - 2.0 % Final  Admission on 05/23/2015, Discharged on 05/23/2015  Component Date Value Ref Range Status  . WBC 05/23/2015 4.8  4.0 -  10.5 K/uL Final  . RBC 05/23/2015 4.71  3.87 - 5.11 MIL/uL Final  . Hemoglobin 05/23/2015 13.6  12.0 - 15.0 g/dL Final  . HCT 05/23/2015 39.5  36.0 - 46.0 % Final  . MCV 05/23/2015 83.9  78.0 - 100.0 fL Final  . MCH 05/23/2015 28.9  26.0 - 34.0 pg Final  . MCHC 05/23/2015 34.4  30.0 - 36.0 g/dL Final  . RDW 05/23/2015 12.6  11.5 - 15.5 % Final  . Platelets 05/23/2015 180  150 - 400 K/uL Final  . Neutrophils Relative % 05/23/2015 74   Final  . Neutro Abs 05/23/2015 3.6  1.7 - 7.7 K/uL Final  . Lymphocytes Relative 05/23/2015 13   Final  . Lymphs Abs 05/23/2015 0.6* 0.7 - 4.0 K/uL Final  . Monocytes Relative 05/23/2015 12   Final  . Monocytes Absolute 05/23/2015 0.6  0.1 - 1.0 K/uL Final  . Eosinophils Relative 05/23/2015 1   Final  . Eosinophils Absolute 05/23/2015 0.1  0.0 - 0.7 K/uL Final  . Basophils Relative 05/23/2015 0   Final  . Basophils Absolute 05/23/2015 0.0  0.0 - 0.1 K/uL Final  . Sodium 05/23/2015 143  135 - 145 mmol/L Final  . Potassium 05/23/2015 4.1  3.5 - 5.1 mmol/L Final  . Chloride 05/23/2015 105  101 - 111 mmol/L Final  . CO2 05/23/2015 27  22 - 32 mmol/L Final  . Glucose, Bld 05/23/2015 81  65 - 99 mg/dL Final  . BUN 05/23/2015 14  6 - 20 mg/dL Final  . Creatinine, Ser 05/23/2015 1.09* 0.44 - 1.00 mg/dL Final  . Calcium 05/23/2015 9.8  8.9 - 10.3 mg/dL Final  . Total Protein 05/23/2015 6.3* 6.5 - 8.1 g/dL Final  . Albumin 05/23/2015 4.3  3.5 - 5.0 g/dL Final  . AST 05/23/2015 24  15 - 41 U/L Final  . ALT 05/23/2015 13* 14 - 54 U/L Final  . Alkaline Phosphatase 05/23/2015 72  38 - 126 U/L Final  . Total Bilirubin 05/23/2015 0.6  0.3 -  1.2 mg/dL Final  . GFR calc non Af Amer 05/23/2015 54* >60 mL/min Final  . GFR calc Af Amer 05/23/2015 >60  >60 mL/min Final   Comment: (NOTE) The eGFR has been calculated using the CKD EPI equation. This calculation has not been validated in all clinical situations. eGFR's persistently <60 mL/min signify possible Chronic  Kidney Disease.   . Anion gap 05/23/2015 11  5 - 15 Final  . Lipase 05/23/2015 27  11 - 51 U/L Final  . Color, Urine 05/23/2015 YELLOW  YELLOW Final  . APPearance 05/23/2015 CLEAR  CLEAR Final  . Specific Gravity, Urine 05/23/2015 1.007  1.005 - 1.030 Final  . pH 05/23/2015 7.5  5.0 - 8.0 Final  . Glucose, UA 05/23/2015 NEGATIVE  NEGATIVE mg/dL Final  . Hgb urine dipstick 05/23/2015 NEGATIVE  NEGATIVE Final  . Bilirubin Urine 05/23/2015 NEGATIVE  NEGATIVE Final  . Ketones, ur 05/23/2015 40* NEGATIVE mg/dL Final  . Protein, ur 05/23/2015 NEGATIVE  NEGATIVE mg/dL Final  . Nitrite 05/23/2015 NEGATIVE  NEGATIVE Final  . Leukocytes, UA 05/23/2015 NEGATIVE  NEGATIVE Final   MICROSCOPIC NOT DONE ON URINES WITH NEGATIVE PROTEIN, BLOOD, LEUKOCYTES, NITRITE, OR GLUCOSE <1000 mg/dL.  . I-stat hCG, quantitative 05/23/2015 5.2* <5 mIU/mL Final  . Comment 3 05/23/2015          Final   Comment:   GEST. AGE      CONC.  (mIU/mL)   <=1 WEEK        5 - 50     2 WEEKS       50 - 500     3 WEEKS       100 - 10,000     4 WEEKS     1,000 - 30,000        FEMALE AND NON-PREGNANT FEMALE:     LESS THAN 5 mIU/mL   . Troponin i, poc 05/23/2015 0.00  0.00 - 0.08 ng/mL Final  . Comment 3 05/23/2015          Final   Comment: Due to the release kinetics of cTnI, a negative result within the first hours of the onset of symptoms does not rule out myocardial infarction with certainty. If myocardial infarction is still suspected, repeat the test at appropriate intervals.     Dg Chest 1 View  07/27/2015  CLINICAL DATA:  Status post right thoracentesis EXAM: CHEST 1 VIEW COMPARISON:  July 27, 2015 11:33 a.m. FINDINGS: There is persistent large right pleural effusion without significant interval change. Consolidation of the right mid and lower lung are identified. The left lung is clear. There is no pneumothorax. The osseous structures are stable. IMPRESSION: Large right pleural effusion unchanged compared prior  exam. There is no pneumothorax. Electronically Signed   By: Abelardo Diesel M.D.   On: 07/27/2015 16:18   Dg Chest 1 View  07/23/2015  CLINICAL DATA:  Right pleural effusion. Status post right thoracentesis. EXAM: CHEST 1 VIEW COMPARISON:  07/17/2015 and 06/12/2015 FINDINGS: There has been a reduction in the large right pleural effusion. Moderate effusion persists. No pneumothorax. Left lung is clear. Heart size and pulmonary vascularity are normal. IMPRESSION: Decreased right pleural effusion.  No pneumothorax. Electronically Signed   By: Lorriane Shire M.D.   On: 07/23/2015 11:09   Dg Chest 2 View  07/24/2015  CLINICAL DATA:  60 year old female with a history of severe shortness of breath. Thoracentesis 07/23/2015 EXAM: CHEST - 2 VIEW COMPARISON:  07/23/2015, 07/17/2015, CT 06/12/2015 FINDINGS: Cardiomediastinal  silhouette partially obscured by overlying lung and pleural disease. Obscuration of the right heart border. Dense opacity at the right lung base obscuring the right hemidiaphragm, right heart border, and increasing from the comparison plain film. Pleural parenchymal thickening. Architectural distortion in the right hilum. Left lung relatively well aerated with chronic changes. IMPRESSION: Persisting right basilar opacity, compatible with pleural effusion and associated atelectasis. Compared to the prior chest x-ray, volume appears to be increasing again. Left lung relatively well aerated. Signed, Dulcy Fanny. Earleen Newport, DO [facility] Electronically Signed   By: Corrie Mckusick D.O.   On: 07/24/2015 10:00   Dg Chest 2 View  07/17/2015  CLINICAL DATA:  Shortness of breath EXAM: CHEST  2 VIEW COMPARISON:  June 12, 2015 FINDINGS: There is a large right pleural effusion and underlying opacity, larger in the interval. Mild patchy opacity in the aerated portion of the right upper lung is a little more prominent as well. The left lung remains clear. No  pneumothorax. No other changes. IMPRESSION: Increasing right pleural fluid with underlying opacity. Mild patchy opacity in the aerated portion of the right upper lung is a little more prominent as well. Electronically Signed   By: Dorise Bullion III M.D   On: 07/17/2015 10:57   Portable Chest 1 View  07/27/2015  CLINICAL DATA:  Right pleuritic chest pain. Patient has thoracentesis July 23, 2015 EXAM: PORTABLE CHEST 1 VIEW COMPARISON:  July 24, 2015 FINDINGS: The heart size and mediastinal contours are stable. There is a large right pleural effusion with consolidation of the right mid and lower lung. The left lung is hyperinflated. The visualized skeletal structures are stable. IMPRESSION: Large right pleural effusion with consolidation of the right mid and lower lung. Electronically Signed   By: Abelardo Diesel M.D.   On: 07/27/2015 12:55   US Thoracentesis Asp Pleural Space W/img Guide  07/27/2015  INDICATION: History of breast cancer with evidence of metastatic lung disease. She has a recurrent right pleural effusion with chronic right pleuritic chest pain. Requested been made for diagnostic and therapeutic thoracentesis today. EXAM: ULTRASOUND GUIDED DIAGNOSTIC AND THERAPEUTIC THORACENTESIS MEDICATIONS: 1% lidocaine COMPLICATIONS: None immediate. PROCEDURE: An ultrasound guided thoracentesis was thoroughly discussed with the patient and questions answered. The benefits, risks, alternatives and complications were also discussed. The patient understands and wishes to proceed with the procedure. Written consent was obtained. Ultrasound was performed to localize and mark an adequate pocket of fluid in the right chest. The area was then prepped and draped in the normal sterile fashion. 1% Lidocaine was used for local anesthesia. A Safe-T-Centesis catheter was introduced. Thoracentesis was performed. The catheter was removed and a dressing applied. FINDINGS: A total of approximately 0.65 L of serosanguineous  fluid was removed. Samples were sent to the laboratory as requested by the clinical team. IMPRESSION: Successful ultrasound guided right thoracentesis yielding 0.65 L of pleural fluid. Once again her procedure was terminated at the patient's request secondary to chest pain. Read by: Saverio Danker, PA-C Electronically Signed   By: Markus Daft M.D.   On: 07/27/2015 15:53   US Thoracentesis Asp Pleural Space W/img Guide  07/23/2015  INDICATION: History of breast cancer with metastatic disease to her lung. Recent shortness of breath and found have a large right pleural effusion. Request has been made for diagnostic and therapeutic right thoracentesis. EXAM: ULTRASOUND GUIDED DIAGNOSTIC AND THERAPEUTIC THORACENTESIS MEDICATIONS: 1% LIDOCAINE COMPLICATIONS: None immediate. PROCEDURE: An ultrasound guided thoracentesis was thoroughly discussed with the  patient and questions answered. The benefits, risks, alternatives and complications were also discussed. The patient understands and wishes to proceed with the procedure. Written consent was obtained. Ultrasound was performed to localize and mark an adequate pocket of fluid in the RIGHT chest. The area was then prepped and draped in the normal sterile fashion. 1% Lidocaine was used for local anesthesia. A Safe-T-Centesis catheter was introduced. Thoracentesis was performed. The procedure was terminated secondary to chest pain. There was still a fair amount of fluid left in the pleural space. The catheter was removed and a dressing applied. She did take a dose of tramadol 50 mg that she brought from home for pain after the procedure. This was discussed with Dr. Laurence Ferrari as well. FINDINGS: A total of approximately 1.25 L of clear yellow fluid was removed. Samples were sent to the laboratory as requested by the clinical team. IMPRESSION: Successful ultrasound guided right thoracentesis yielding 1.25 L of pleural fluid. Read by: Saverio Danker, PA-C Electronically Signed    By: Jacqulynn Cadet M.D.   On: 07/23/2015 11:15     Assessment/Plan   ICD-9-CM ICD-10-CM   1. Anxiety 300.00 F41.9   2. Adjustment disorder with mixed anxiety and depressed mood 309.28 F43.23   3. Pleural effusion - recurrent right 511.9 J90   4. Breast cancer metastasized to lung, right (HCC) 174.9 C50.911    197.0 C78.00   5. 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 Pleuritic chest pain due to #3 786.52 R07.81   7. Upper abdominal pain 789.09 R10.10   8. Other constipation 564.09 K59.09     D/c zoloft per pt request  Start miralax 17gm daily prn for constipation  Cont other meds as ordered  palliative care consult for breast CA with mets; recurrent pleural effusion Pt/OT/ST as ordered  F/u with Dr Burr Medico as scheduled  GOAL: short term rehab and d/c home when medically appropriate. Communicated with pt and nursing.  Will follow  Lanee Chain S. Perlie Gold  [facility] [address] [place], [place] [address] [phone] Cell (Monday-Friday 8 AM - 5 PM) [phone] After 5 PM and follow prompts

## 2015-08-04 ENCOUNTER — Emergency Department (HOSPITAL_COMMUNITY): Payer: Medicaid Other

## 2015-08-04 ENCOUNTER — Encounter: Payer: Self-pay | Admitting: General Practice

## 2015-08-04 ENCOUNTER — Telehealth: Payer: Self-pay | Admitting: Hematology

## 2015-08-04 ENCOUNTER — Telehealth: Payer: Self-pay | Admitting: *Deleted

## 2015-08-04 DIAGNOSIS — C50919 Malignant neoplasm of unspecified site of unspecified female breast: Secondary | ICD-10-CM

## 2015-08-04 DIAGNOSIS — Z66 Do not resuscitate: Secondary | ICD-10-CM

## 2015-08-04 DIAGNOSIS — Z789 Other specified health status: Secondary | ICD-10-CM

## 2015-08-04 LAB — COMPREHENSIVE METABOLIC PANEL
ALBUMIN: 3.3 g/dL — AB (ref 3.5–5.0)
ALK PHOS: 66 U/L (ref 38–126)
ALT: 39 U/L (ref 14–54)
ANION GAP: 13 (ref 5–15)
AST: 42 U/L — ABNORMAL HIGH (ref 15–41)
BUN: 9 mg/dL (ref 6–20)
CALCIUM: 9.5 mg/dL (ref 8.9–10.3)
CHLORIDE: 98 mmol/L — AB (ref 101–111)
CO2: 25 mmol/L (ref 22–32)
Creatinine, Ser: 0.77 mg/dL (ref 0.44–1.00)
GFR calc non Af Amer: >60 mL/min (ref >60)
GLUCOSE: 95 mg/dL (ref 65–99)
Potassium: 3.8 mmol/L (ref 3.5–5.1)
SODIUM: 136 mmol/L (ref 135–145)
Total Bilirubin: 0.8 mg/dL (ref 0.3–1.2)
Total Protein: 6.4 g/dL — ABNORMAL LOW (ref 6.5–8.1)

## 2015-08-04 LAB — URINALYSIS, ROUTINE W REFLEX MICROSCOPIC
Bilirubin Urine: NEGATIVE
GLUCOSE, UA: NEGATIVE mg/dL
Hgb urine dipstick: NEGATIVE
Ketones, ur: 15 mg/dL — AB
LEUKOCYTES UA: NEGATIVE
NITRITE: NEGATIVE
PH: 6 (ref 5.0–8.0)
Protein, ur: NEGATIVE mg/dL
SPECIFIC GRAVITY, URINE: 1.014 (ref 1.005–1.030)

## 2015-08-04 LAB — CBC
HEMATOCRIT: 40.6 % (ref 36.0–46.0)
Hemoglobin: 13.7 g/dL (ref 12.0–15.0)
MCH: 28 pg (ref 26.0–34.0)
MCHC: 33.7 g/dL (ref 30.0–36.0)
MCV: 82.9 fL (ref 78.0–100.0)
PLATELETS: 297 10*3/uL (ref 150–400)
RBC: 4.9 MIL/uL (ref 3.87–5.11)
RDW: 12.9 % (ref 11.5–15.5)
WBC: 5 10*3/uL (ref 4.0–10.5)

## 2015-08-04 LAB — I-STAT TROPONIN, ED: Troponin i, poc: 0 ng/mL (ref 0.00–0.08)

## 2015-08-04 LAB — LIPASE, BLOOD: LIPASE: 19 U/L (ref 11–51)

## 2015-08-04 MED ORDER — SENNOSIDES-DOCUSATE SODIUM 8.6-50 MG PO TABS: 1.0000 | ORAL_TABLET | Freq: Every day | ORAL | Status: DC

## 2015-08-04 MED ORDER — POLYETHYLENE GLYCOL 3350 17 G PO PACK: 17.0000 g | PACK | Freq: Two times a day (BID) | ORAL | Status: AC | PRN

## 2015-08-04 MED ORDER — LORAZEPAM 2 MG/ML IJ SOLN
1.0000 mg | Freq: Once | INTRAMUSCULAR | Status: AC
  Administered 2015-08-04: 1 mg via INTRAVENOUS
  Filled 2015-08-04: qty 1

## 2015-08-04 MED ORDER — IOPAMIDOL (ISOVUE-300) INJECTION 61%
INTRAVENOUS | Status: AC
  Administered 2015-08-04: 75 mL
  Filled 2015-08-04: qty 100

## 2015-08-04 MED ORDER — MAGNESIUM CITRATE PO SOLN: 1.0000 | Freq: Once | ORAL | Status: DC

## 2015-08-04 NOTE — Discharge Instructions (Signed)
Pleural Effusion °A pleural effusion is an abnormal buildup of fluid in the layers of tissue between your lungs and the inside of your chest (pleural space). These two layers of tissue that line both your lungs and the inside of your chest are called pleura. Usually, there is no air in the space between the pleura, only a thin layer of fluid. If left untreated, a large amount of fluid can build up and cause the lung to collapse. A pleural effusion is usually caused by another disease that requires treatment. °The two main types of pleural effusion are: °· Transudative pleural effusion. This happens when fluid leaks into the pleural space because of a low protein count in your blood or high blood pressure in your vessels. Heart failure often causes this. °· Exudative infusion. This occurs when fluid collects in the pleural space from blocked blood vessels or lymph vessels. Some lung diseases, injuries, and cancers can cause this type of effusion. °CAUSES °Pleural effusion can be caused by: °· Heart failure. °· A blood clot in the lung (pulmonary embolism). °· Pneumonia. °· Cancer. °· Liver failure (cirrhosis). °· Kidney disease. °· Complications from surgery, such as from open heart surgery. °SIGNS AND SYMPTOMS °In some cases, pleural effusion may cause no symptoms. Symptoms can include: °· Shortness of breath, especially when lying down. °· Chest pain, often worse when taking a deep breath. °· Fever. °· Dry cough that is lasting (chronic). °· Hiccups. °· Rapid breathing. °An underlying condition that is causing the pleural effusion (such as heart failure, pneumonia, blood clots, tuberculosis, or cancer) may also cause additional symptoms. °DIAGNOSIS °Your health care provider may suspect pleural effusion based on your symptoms and medical history. Your health care provider will also do a physical exam and a chest X-ray. If the X-ray shows there is fluid in your chest, you may need to have this fluid removed using a  needle (thoracentesis) so it can be tested. °You may also have: °· Imaging studies of the chest, such as: °¨ Ultrasound. °¨ CT scan. °· Blood tests for kidney and liver function. °TREATMENT °Treatment depends on the cause of the pleural effusion. Treatment may include: °· Taking antibiotic medicines to clear up an infection that is causing the pleural effusion. °· Placing a tube in the chest to drain the effusion (tube thoracostomy). This procedure is often used when there is an infection in the fluid. °· Surgery to remove the fibrous outer layer of tissue from the pleural space (decortication). °· Thoracentesis, which can improve cough and shortness of breath. °· A procedure to put medicine into the chest cavity to seal the pleural space to prevent fluid buildup (pleurodesis). °· Chemotherapy and radiation therapy. These may be required in the case of cancerous (malignant) pleural effusion. °HOME CARE INSTRUCTIONS °· Take medicines only as directed by your health care provider. °· Keep track of how long you can gently exercise before you get short of breath. Try simply walking at first. °· Do not use any tobacco products, including cigarettes, chewing tobacco, or electronic cigarettes. If you need help quitting, ask your health care provider. °· Keep all follow-up visits as directed by your health care provider. This is important. °SEEK MEDICAL CARE IF: °· The amount of time that you are able to exercise decreases or does not improve with time. °· You have pain or signs of infection at the puncture site if you had thoracentesis. Watch for: °¨ Drainage. °¨ Redness. °¨ Swelling. °· You have a fever. °  SEEK IMMEDIATE MEDICAL CARE IF: °· You are short of breath. °· You develop chest pain. °· You develop a new cough. °MAKE SURE YOU: °· Understand these instructions. °· Will watch your condition. °· Will get help right away if you are not doing well or get worse. °  °This information is not intended to replace advice  given to you by your health care provider. Make sure you discuss any questions you have with your health care provider. °  °Document Released: 04/17/2005 Document Revised: 05/08/2014 Document Reviewed: 09/10/2013 °Elsevier Interactive Patient Education ©2016 [employer] ° °

## 2015-08-04 NOTE — Progress Notes (Signed)
   08/04/15 0700  Clinical Encounter Type  Visited With Patient  Visit Type ED  Referral From Patient  Spiritual Encounters  Spiritual Needs Prayer;Emotional  Stress Factors  Patient Stress Factors Lack of caregivers;Major life changes  Patient requested chaplain because she does not want to go back to the facility in which she is living. She suffers from severe pain due to breast cancer, and her only support appears to be her ex-husband. She was called to get a CT scan just as I was talking to her. I walked her to the test and told her I would pray for her.

## 2015-08-04 NOTE — Telephone Encounter (Signed)
cld & spoke to pt and adv moving appt to 4/19-pt requested to keep her appt for 4/6-left appt for 1:15-4/6

## 2015-08-04 NOTE — Progress Notes (Signed)
Spiritual Care Note  Reached Patricia Carter on her cell phone at the SNF for follow-up emotional support.  During this conversation she came across as calm and focused, without the anxiety of our last encounter.  She verbalized her gratitude for the prayer shawl, noting that it has brought comfort, and she was using it to prop her head up while we talked.  She also stated that she has sought conversation with other residents.  Both of these comments speak to her valuing community and connection.  Patricia Carter also named that encountering stressful situations--such as when her SNF roommate died and again while she recounted the experience during our call--causes pain in her chest.  This suggests an awareness of somatizing her emotional distress or experiencing exacerbated pain in her body when her distress peaks.  Pt courteously interrupted conversation in order to pursue her meds, which, per pt, were 45 minutes late.  She is aware of ongoing chaplain availability for support and plans to return call at a better time.  Following for support, but please also page as needs arise/circumstances change.  Thank you.  [place], [place], [facility] Pager [phone] Voicemail  [phone]

## 2015-08-04 NOTE — Telephone Encounter (Signed)
-----   Message from Truitt Merle, MD sent at 08/04/2015  8:52 AM EDT ----- Janifer Adie, please let her know that I am aware she went to ED yesterday and I will see her tomorrow if she is able to come to our clinic.Thanks.   Truitt Merle

## 2015-08-04 NOTE — Telephone Encounter (Signed)
Called pt & she plans to keep appt tomorrow.

## 2015-08-05 ENCOUNTER — Encounter: Payer: Self-pay | Admitting: Hematology

## 2015-08-05 ENCOUNTER — Ambulatory Visit: Payer: Self-pay | Admitting: Hematology

## 2015-08-05 ENCOUNTER — Ambulatory Visit (HOSPITAL_BASED_OUTPATIENT_CLINIC_OR_DEPARTMENT_OTHER): Payer: Medicaid Other | Admitting: Hematology

## 2015-08-05 ENCOUNTER — Telehealth: Payer: Self-pay | Admitting: Hematology

## 2015-08-05 VITALS — BP 113/76 | HR 98 | Temp 97.3°F | Resp 18 | Ht 63.0 in

## 2015-08-05 DIAGNOSIS — G893 Neoplasm related pain (acute) (chronic): Secondary | ICD-10-CM

## 2015-08-05 DIAGNOSIS — F4323 Adjustment disorder with mixed anxiety and depressed mood: Secondary | ICD-10-CM

## 2015-08-05 DIAGNOSIS — C771 Secondary and unspecified malignant neoplasm of intrathoracic lymph nodes: Secondary | ICD-10-CM | POA: Diagnosis not present

## 2015-08-05 DIAGNOSIS — C50911 Malignant neoplasm of unspecified site of right female breast: Secondary | ICD-10-CM

## 2015-08-05 DIAGNOSIS — C7800 Secondary malignant neoplasm of unspecified lung: Secondary | ICD-10-CM | POA: Diagnosis not present

## 2015-08-05 DIAGNOSIS — J910 Malignant pleural effusion: Secondary | ICD-10-CM

## 2015-08-05 DIAGNOSIS — R530 Neoplastic (malignant) related fatigue: Secondary | ICD-10-CM

## 2015-08-05 NOTE — Telephone Encounter (Signed)
per pof to sch pt appt-gave pt copy of avs °

## 2015-08-05 NOTE — Progress Notes (Signed)
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 Deep surgical margin were positive, pT2N1, tumor size 3.7 x 3.0 x 2.2 cm, grade 3, 2 out of 10 lymph nodes were positive for tumor cells. Patient declined adjuvant chemotherapy, radiation and endocrin therapy   04/30/2014 Progression She presented with worsening cough and dyspnea. CT on 05/01/2015 showed right hilar mass with compression of the SVC and right upper lobe pulmonary artery and obstruction of the right middle lobe bronchus. Moderate pericardial effusion   05/05/2014 Pathology Results Endobronchial biopsy of right lower lobe showed poorly differentiated adenocarcinoma, estrogen receptor positive, CK AE1/AE3 positive. Negative for CK 7, CK 20, TTF-1, S100,etc.  HER2(-). Pericardium biopsy negative.   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 CT abdomen and pelvis is negative for mets.    08/17/2014 - 09/18/2014 Anti-estrogen oral therapy Letrozole 57m daily and palbociclib 1269mdaily 3 weeks on 1 week off, starting 08/18/2014, palbo was held for second cycle due to AEs (dizziness, fatigue and nose bleeding), Letrozole was held also due to AEs.    09/30/2014 - 10/19/2014 Anti-estrogen oral therapy She subsequently tried anastrozole and tamoxifen, had a severe side effects, such as dizziness, fatigue, mood swings, after the first few  doses, could not tolerate and stopped.   12/09/2014 Imaging CT chest abdomen and pelvis showed radiation changes in the right perihilar region was decreased in size of a right middle lobe mass, scatter right upper lobe pulmonary nodules, stable overall.   12/09/2014 Imaging Bone scan showed stable mild increased uptake in the lateral left fifth and a right sixth ribs, stable overall.   02/18/2015 - 03/05/2015 Anti-estrogen oral therapy She tried fulvestrant to 50 mg injection for 2 doses, could not tolerate and stopped.    07/23/2015 Procedure Right thoracentesis, was 1.2 L was removed. Cytology was negative   07/27/2015 - 07/30/2015 [facility]. [facility] to a [facility].     HISTORY OF PRESENTING ILLNESS (07/14/2014):  Patricia Equihua60.o. female is here because of recently diagnosed metastatic recurrent breast cancer.  She was diagnosed with pT2 N1M0, stage IIB, left breast cancer in August 2011. [facility], and declined adjuvant chemotherapy and endocrine therapy. She has not followed with physician regularly after that and does not have annual mammogram.  She started having dry cough and dyspnea on exertion in May 2016. She had multiple ED visit since then and was treated with multiple courses of antibiotics for bronchitis, which really did not help. In December 2015, she called the bowel pulmonary clinic and was evaluated for worsening cough and dyspnea. A CT of chest was obtained which showed a right hilar mass which cause compression on SVC and right  upper lobe pulmonary artery and obstruction of right middle lobe. Moderate pericardial effusion was noticed. She underwent bronchoscopy and pericardial window on 05/01/2014.  Biopsy of the right endobronchial lesion showed poorly differentiated adenoma, consistent with breast primary. Pericardial window biopsy and cytology was negative for malignancy.  She started palliative radiation on 06/10/2014, and completed on 06/29/2014, a total of 14 sessions. She tolerated well initially, but developed severe substernal chest pain and dysphagia at the last week of radiation. She still has quite a bit chest pain, is taking Bumex and oxycodone, which helps only some. She is able to eat soft food diet and adequate. She lost about 7 pounds during the radiation. Her dyspnea and cough has significantly improved after radiation.  CURRENT TREATMENT: Supportive care  INTERIM HISTORY: She returns for follow up. She was admitted from my office on 07/27/2015, for severe right chest pain, anxiety, failure to thrive at home. Her pain was better controlled with Vicodin, and she was discharged to a nursing home after 3 days of hospital stay. She has been taking Vicodin 4 tablets a day, and her pain overall is controlled. She feels she does not get enough help to nursing home, and is scared to ask for help especially at night. She has been having abdominal pain the past few days, with constipation, went to emergency room last night. She was started on MiraLAX and Senokot, abdominal pain improved with bowel movement. She has participated the physical therapy in the rehabilitation twice, she likes it, but has not been able to participate irregularly due to her abdominal pain. She states in her room was that time, has not been moving around. Her appetite is moderate, eat small meals. Her anxiety seems to be better controlled compared to last week.   MEDICAL HISTORY:  Past Medical History  Diagnosis Date  . Breast cancer ([name])   . Lung mass   . Anxiety about health     SURGICAL HISTORY: Past Surgical History  Procedure Laterality Date  . Mastectomy Left     2010  . Eye surgery      cataract  surgery, wears contact left eye  . Subxyphoid pericardial window N/A 05/05/2014    Procedure: SUBXYPHOID PERICARDIAL WINDOW;  Surgeon: Gaye Pollack, MD;  Location: [facility] OR;  Service: Thoracic;  Laterality: N/A;  . Flexible bronchoscopy N/A 05/05/2014    Procedure: FLEXIBLE BRONCHOSCOPY;  Surgeon: Gaye Pollack, MD;  Location: [place];  Service: Thoracic;  Laterality: N/A;  . Video bronchoscopy with endobronchial ultrasound N/A 05/05/2014    Procedure: VIDEO BRONCHOSCOPY WITH ENDOBRONCHIAL ULTRASOUND;  Surgeon: Gaye Pollack, MD;  Location: [place] OR;  Service: Thoracic;  Laterality: N/A;    SOCIAL HISTORY: Social History   Social History  . Marital Status:  divorced     Spouse Name: N/A  . Number of Children:  she has one daughter who lives in [place]   . Years of Education: N/A   Occupational History  . Works PT reception    Social History Main Topics  . Smoking status: Never Smoker   . Smokeless tobacco: Never Used  . Alcohol Use: 0.0 oz/week    0 Standard drinks or equivalent per week     Comment: occ  . Drug Use: No  . Sexual Activity: Not Currently   Other Topics Concern  . Not on file   Social History Narrative    FAMILY HISTORY: Family History  Problem Relation Age of Onset  . Cancer Paternal Aunt  leukemia  . Cancer Father 46    prostate cancer     ALLERGIES:  is allergic to iopamidol; omeprazole; oxycodone; prednisone; and zyrtec.  MEDICATIONS:  Current Outpatient Prescriptions  Medication Sig Dispense Refill  . ALPRAZolam (XANAX) 0.5 MG tablet Take 1 mg by mouth every 8 (eight) hours as needed for anxiety.    Marland Kitchen alum & mag hydroxide-simeth (MYLANTA) 200-200-20 MG/5ML suspension Take 30 mLs by mouth every 4 (four) hours as needed for indigestion.    . ergocalciferol (DRISDOL) 8000 UNIT/ML drops Take 8,000 Units by mouth daily.    Marland Kitchen HYDROcodone-acetaminophen (NORCO/VICODIN) 5-325 MG tablet Take 1 tablet by mouth every 4 (four) hours as needed for moderate pain.      . magnesium citrate SOLN Take 296 mLs (1 Bottle total) by mouth once. 195 mL 0  . polyethylene glycol (MIRALAX / GLYCOLAX) packet Take 17 g by mouth 2 (two) times daily as needed for moderate constipation. 30 each 0  . senna-docusate (SENOKOT-S) 8.6-50 MG tablet Take 1 tablet by mouth daily. 30 tablet 0   No current facility-administered medications for this visit.    REVIEW OF SYSTEMS:   Constitutional: Denies fevers, chills or abnormal night sweats. (+) fatigue  Eyes: Denies blurriness of vision, double vision or watery eyes Ears, nose, mouth, throat, and face: Denies mucositis or sore throat Respiratory:cough and dyspnea has significantly improved after radiation, no wheezes Cardiovascular: (+) mid chest pain, Denies palpitation, or lower extremity swelling Gastrointestinal:  Denies nausea, heartburn or change in bowel habits Skin: Denies abnormal skin rashes Lymphatics: Denies new lymphadenopathy or easy bruising Neurological:Denies numbness, tingling or new weaknesses Behavioral/Psych: Mood is stable, no new changes  All other systems were reviewed with the patient and are negative.  PHYSICAL EXAMINATION: ECOG PERFORMANCE STATUS: 3  Filed Vitals:   08/05/15 1431  BP: 113/76  Pulse: 98  Temp: 97.3 F (36.3 C)  Resp: 18   GENERAL:alert, In acute distress, tachypnea, due to her pain and anxiety  SKIN: skin color, texture, turgor are normal, no rashes or significant lesions EYES: normal, conjunctiva are pink and non-injected, sclera clear OROPHARYNX:no exudate, no erythema and lips, buccal mucosa, and tongue normal  NECK: supple, thyroid normal size, non-tender, without nodularity LYMPH:  no palpable lymphadenopathy in the cervical, axillary or inguinal LUNGS: clear to auscultation and percussion with normal breathing effort on left, diminished breath sounds in the right lung base up to mid chest. HEART: regular rate & rhythm and no murmurs and no lower extremity  edema ABDOMEN:abdomen soft, non-tender and normal bowel sounds Musculoskeletal:no cyanosis of digits and no clubbing  PSYCH: alert & oriented x 3 with fluent speech NEURO: no focal motor/sensory deficits  LABORATORY DATA:  I have reviewed the data as listed CBC Latest Ref Rng 08/03/2015 07/27/2015 07/19/2015  WBC 4.0 - 10.5 K/uL 5.0 4.0 4.9  Hemoglobin 12.0 - 15.0 g/dL 13.7 13.3 13.7  Hematocrit 36.0 - 46.0 % 40.6 38.5 41.6  Platelets 150 - 400 K/uL 297 281 290    CMP Latest Ref Rng 08/03/2015 07/27/2015 07/19/2015  Glucose 65 - 99 mg/dL 95 92 113  BUN 6 - 20 mg/dL 9 17 14.1  Creatinine 0.44 - 1.00 mg/dL 0.77 0.67 1.0  Sodium 135 - 145 mmol/L 136 134(L) 137  Potassium 3.5 - 5.1 mmol/L 3.8 3.8 4.0  Chloride 101 - 111 mmol/L 98(L) 97(L) -  CO2 22 - 32 mmol/L 25 27 28   Calcium 8.9 - 10.3 mg/dL 9.5 9.3 10.0  Total Protein  6.5 - 8.1 g/dL 6.4(L) 6.4(L) 7.5  Total Bilirubin 0.3 - 1.2 mg/dL 0.8 0.7 0.50  Alkaline Phos 38 - 126 U/L 66 62 72  AST 15 - 41 U/L 42(H) 26 27  ALT 14 - 54 U/L 39 15 15    PATHOLOGY REPORT: Diagnosis 05/05/2014 1. Pericardium, biopsy - BENIGN MESOTHELIAL LINED FIBROADIPOSE TISSUE. 2. Endobronchial biopsy, right middle lobe - POORLY DIFFERENTIATED CARCINOMA - SEE COMMENT. 3. Endobronchial biopsy, right lower lobe - POORLY DIFFERENTIATED CARCINOMA. - SEE COMMENT. 1 of 2 FINAL for MELENA, HAYES ([id number]) Microscopic Comment 2. , 3. The malignant cells are positive for cytokeratin AE1/AE3 and estrogen receptor. They are negative for cytokeratin 7, cytokeratin 20, S-100, GCDFP, CD45, chromogranin, cytokeratin 5/6, synaptophysin, TTF-1. This immunohistochemical profile is not specific. However, given the patient's history, the positive staining for estrogen receptor is most suggestive of metastatic primary breast carcinoma. A HER-2 will be attempted and the results reportedly separately. (JBK:ecj 05/11/2014)  Results: HER-2/NEU BY CISH - NO AMPLIFICATION OF HER-2  DETECTED. RESULT RATIO OF HER2: CEP 17 SIGNALS 0.94 AVERAGE HER2 COPY NUMBER PER CELL 1.50  RADIOGRAPHIC STUDIES: I have personally reviewed the radiological images as listed and agreed with the findings in the report.  CT abdomen and pelvis 05/23/2015 IMPRESSION: Moderate in size right pleural effusion.  Peribronchovascular airspace disease in the right middle lobe, partially visualized, with associated anterior right subpleural linear thickening. Differential diagnosis includes pneumonia, postobstructive atelectasis or lymphangitic spread or metastatic disease. Radiation field changes are felt unlikely.  No CT evidence of acute abnormalities within the abdomen or pelvis.  Persistent thickening of the duodenum. Nonspecific finding. This may represent a duodenitis. Correlation to upper endoscopy may be Considered.  Echo 01/19/2015 Study Conclusions  - Left ventricle: The cavity size was normal. Wall thickness was normal. Systolic function was normal. The estimated ejection fraction was in the range of 55% to 60%. Wall motion was normal; there were no regional wall motion abnormalities. -Pericardium: There was no pericardial effusion.  ASSESSMENT & PLAN:  60 year old female with past medical history of stage IIB left breast cancer, status post mastectomy and axillary lymph node dissection in 2011. Now presented with metastatic disease to lungs and lymph nodes.  1. 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 She never smoked, and immunostain does not support primary lung cancer. -We discussed the natural history of her cancer, unfortunately this is incurable at this stage, but treatable and we do have many treatment options.  -She has had good response to palliative radiation  with significant symptom improvement. -Her recent CT chest showed a worsening pleural effusion, and the pleural metastasis, consistent with disease progression. She had thoracentesis done on 07/23/2015, cytology was negative.  -She has tried letrozole, anastrozole, tamoxifen and fulvestrant, but could not tolerate.  -We previously discussed the other treatment option, which would be chemotherapy. Giving her intolerance to antiestrogen therapy, I think her tolerance to chemotherapy would be quite low.  -Other option is Ibrance alone, which has 20-30% response rate. Pt agrees to consider when she is able to go home. She is still under a lot of stress lately, and is not willing to try for now, which I think is very reasonable. -Due to her psychological issue (anxiety and depression) and a very limited social support, her anticancer treatment option is very limited. -We again  addressed the goal of care, which is palliative, and she agrees her pain control and quality life is her main concern. -We discussed palliative care and hospice, which I think will give her her more social support and symptom management at home, she agrees to consider when she is discharged home.    2. Right pleural effusion and pain  -She has intractable right side chest pain, likely related to her for metastasis and thoracentesis  -She is on Vicodin as needed, she takes 3-4 tablets a day, which discussed pain medication titration, and adding long-acting aquatics if she needed.  3. Depression, anxiety and frequent panic attack -She will continue taking xanax and Ativan -I encourage her to continue follow-up with her psychiatrist Ms Elisabeth Cara,  she only saw her twice   4. Code status -she is agreeable with DO NOT RESUSCITATE and DO NOT INTUBATE  5. Constipation -Pain medication-induced constipation were discussed with patient and her ex-husband [name] in details -She knows to use MiraLAX and Senokot as needed to keep her bowel  moving   Plan for today: -she will continue rehab, the goal is going home within 30 days, her ex-husband is trying to find a place for her in [place] or [place] area which is closer to her family members. -We discussed pain and constipation management  -I'll see her back in 2 weeks for follow-up  All questions were answered. The patient knows to call the clinic with any problems, questions or concerns. I spent 25 minutes counseling the patient face to face. The total time spent in the appointment was 30 minutes and more than 50% was on counseling.     Truitt Merle, MD 08/05/2015

## 2015-08-09 ENCOUNTER — Encounter: Payer: Self-pay | Admitting: Adult Health

## 2015-08-09 ENCOUNTER — Other Ambulatory Visit: Payer: Self-pay | Admitting: *Deleted

## 2015-08-09 ENCOUNTER — Non-Acute Institutional Stay (SKILLED_NURSING_FACILITY): Payer: Medicaid Other | Admitting: Adult Health

## 2015-08-09 DIAGNOSIS — C7800 Secondary malignant neoplasm of unspecified lung: Secondary | ICD-10-CM | POA: Diagnosis not present

## 2015-08-09 DIAGNOSIS — C50911 Malignant neoplasm of unspecified site of right female breast: Secondary | ICD-10-CM | POA: Diagnosis not present

## 2015-08-09 DIAGNOSIS — G893 Neoplasm related pain (acute) (chronic): Secondary | ICD-10-CM

## 2015-08-09 MED ORDER — HYDROCODONE-ACETAMINOPHEN 10-325 MG PO TABS: 1.0000 | ORAL_TABLET | ORAL | Status: AC | PRN

## 2015-08-09 NOTE — Progress Notes (Signed)
Patient ID: Patricia Carter, female   DOB: 06/25/1955, 60 y.o.   MRN: [id number]   Facility: [facility]       Allergies  Allergen Reactions  . Iopamidol Hives    Uncoded Allergy. Allergen: DIZIPROMINE, Other Reaction: OTHER REACTION  . Omeprazole Other (See Comments)    Abdominal cramps and constipation   . Oxycodone Other (See Comments)    Migraines and dizziness   . Prednisone Palpitations  . Zyrtec [Cetirizine] Palpitations    Chief Complaint  Patient presents with  . Acute Visit    pain management     HPI:  She is having pain management issues. Her pain is mainly focused on her rib cage areas with the right worse than the left side. She is very tearful and anxious. She tells me that she does not understand why she is here and she is wanting to go home with children. We did have a prolonged discussion about her pain management and future care plans. The social worker will be speaking with her as well.   Past Medical History  Diagnosis Date  . Breast cancer ([place])   . Lung mass   . Anxiety about health     Past Surgical History  Procedure Laterality Date  . Mastectomy Left     2010  . Eye surgery      cataract surgery, wears contact left eye  . Subxyphoid pericardial window N/A 05/05/2014    Procedure: SUBXYPHOID PERICARDIAL WINDOW;  Surgeon: Gaye Pollack, MD;  Location: [place] OR;  Service: Thoracic;  Laterality: N/A;  . Flexible bronchoscopy N/A 05/05/2014    Procedure: FLEXIBLE BRONCHOSCOPY;  Surgeon: Gaye Pollack, MD;  Location: [place];  Service: Thoracic;  Laterality: N/A;  . Video bronchoscopy with endobronchial ultrasound N/A 05/05/2014    Procedure: VIDEO BRONCHOSCOPY WITH ENDOBRONCHIAL ULTRASOUND;  Surgeon: Gaye Pollack, MD;  Location: [facility] OR;  Service: Thoracic;  Laterality: N/A;    VITAL SIGNS BP 110/78 mmHg  Pulse 96  Temp(Src) 97.9 F (36.6 C) (Oral)  Resp 17  Ht 5\' 3"  (1.6 m)  Wt 91 lb (41.277 kg)  BMI 16.12 kg/m2  SpO2 98%  Patient's Medications    New Prescriptions   No medications on file  Previous Medications   ALPRAZOLAM (XANAX) 0.5 MG TABLET    Take 1-2 mg by mouth every 8 (eight) hours as needed for anxiety.    ALUM & MAG HYDROXIDE-SIMETH (MYLANTA) I037812 MG/5ML SUSPENSION    Take 30 mLs by mouth every 4 (four) hours as needed for indigestion.   ERGOCALCIFEROL (DRISDOL) 8000 UNIT/ML DROPS    Take 8,000 Units by mouth daily.   HYDROCODONE-ACETAMINOPHEN (NORCO/VICODIN) 5-325 MG TABLET    Take 1-2 tablets by mouth every 4 (four) hours as needed for moderate pain.    MAGNESIUM HYDROXIDE (MILK OF MAGNESIA) 400 MG/5ML SUSPENSION    Take 30 mLs by mouth daily as needed for mild constipation.   POLYETHYLENE GLYCOL (MIRALAX / GLYCOLAX) PACKET    Take 17 g by mouth 2 (two) times daily as needed for moderate constipation.   SENNA-DOCUSATE (SENOKOT-S) 8.6-50 MG TABLET    Take 1 tablet by mouth daily.   TRAMADOL (ULTRAM) 50 MG TABLET    Take 50 mg by mouth every 6 (six) hours as needed.  Modified Medications   No medications on file  Discontinued Medications   MAGNESIUM CITRATE SOLN    Take 296 mLs (1 Bottle total) by mouth once.     SIGNIFICANT DIAGNOSTIC  EXAMS  08-04-15: ct of abdomen and pelvis: Sizable right pleural effusion with significant subpulmonic component. Areas of consolidation in the right middle and lower lobe regions with scarring in the right middle lobe. Ptotic right kidney without hydronephrosis or renal mass. Stool throughout colon. Rectum is distended with stool. The rectal wall does not appear appreciably thickened. No bowel obstruction.  No abscess. No intra-abdominal or pelvic metastatic foci evident.    LABS REVIEWED:   08-03-15; wbc 5.0; hgb 13.;7 hct 40.6; mcv 82.9; plt 297; glucose 95; bun 9; creat 0.77; k+ 3.8; na++136; ast 42; albumin 3.3   Review of Systems  Constitutional: Negative for malaise/fatigue.  Respiratory: Negative for cough and shortness of breath.   Cardiovascular: Negative for chest  pain, palpitations and leg swelling.  Gastrointestinal: Negative for heartburn, abdominal pain and constipation.  Musculoskeletal: Positive for myalgias and joint pain. Negative for back pain.       Has bilateral rib cage area pain right >left   Skin: Negative.   Neurological: Negative for dizziness.  Psychiatric/Behavioral: Positive for depression. The patient is nervous/anxious.     Physical Exam  Constitutional: She is oriented to person, place, and time. She appears distressed.  Mildly distressed Is frail   Eyes: Conjunctivae are normal.  Neck: Neck supple. No JVD present. No thyromegaly present.  Cardiovascular: Normal rate, regular rhythm and intact distal pulses.   Respiratory: Effort normal and breath sounds normal. No respiratory distress. She has no wheezes.  GI: Soft. Bowel sounds are normal. She exhibits no distension. There is no tenderness.  Musculoskeletal: She exhibits no edema.  Able to move all extremities   Lymphadenopathy:    She has no cervical adenopathy.  Neurological: She is alert and oriented to person, place, and time.  Skin: Skin is warm and dry. She is not diaphoretic.  Psychiatric:  Anxious and tearful       ASSESSMENT/ PLAN:  1. 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 Cancer associated pain  1. Will change her vicodin to 10/325 mg every 4 hours as needed to lower her volume of tylenol daily. Will continue to monitor her status.    Time spent with patient  40  minutes >50% time spent counseling; reviewing medical record; tests; labs; and developing future plan of care    Ok Edwards NP [facility]  Contact [phone] Monday through Friday 8am- 5pm  After hours call [phone]

## 2015-08-16 ENCOUNTER — Telehealth: Payer: Self-pay | Admitting: *Deleted

## 2015-08-16 NOTE — Telephone Encounter (Signed)
Attempted to call pt unsuccessfully.  Left message on voice mail requesting a call back from pt.

## 2015-08-16 NOTE — Telephone Encounter (Signed)
TC from patient asking to re-schedule appt with Dr. Burr Medico. Currently is scheduled for this Wednesday.  Pt requesting a call back from Dr. Ernestina Penna nurse as soon as possible.

## 2015-08-18 ENCOUNTER — Encounter: Payer: Self-pay | Admitting: Hematology

## 2015-08-18 ENCOUNTER — Telehealth: Payer: Self-pay | Admitting: *Deleted

## 2015-08-18 ENCOUNTER — Telehealth: Payer: Self-pay | Admitting: Hematology

## 2015-08-18 ENCOUNTER — Ambulatory Visit: Payer: Self-pay | Admitting: Hematology

## 2015-08-18 ENCOUNTER — Other Ambulatory Visit: Payer: Self-pay

## 2015-08-18 NOTE — Telephone Encounter (Signed)
Called earlier & left message on pt's vm to call back regarding missed appt today.  Pt's ex-husband called & asked why she needed to be seen.  Informed that this was a f/u visit with Dr Burr Medico to discuss care & decisions from [place] for treatment.  He reports that she is doing bad, having a problem talking, swallowing,unable to eat.  He states the food is not good & he is not pleased with the care that she is getting. States that a friend that works at [employer] thinks she would qualify for in house hospice. Discussed with Dr Burr Medico & she is agreeable that pt is hospice appropriate with out treatment & is OK for [facility]osp[facility] referral.  Call made to Manus Gunning at [facility] & she was familiar with Patricia Carter & is waiting on order from the facility & expects order within next 24 hours.  She has all the info that she needs-demographic- wise.

## 2015-08-18 NOTE — Progress Notes (Signed)
No show  This encounter was created in error - please disregard.

## 2015-08-18 NOTE — Telephone Encounter (Signed)
left msg for pt to call back and reschedule with a convenient time

## 2015-08-24 ENCOUNTER — Non-Acute Institutional Stay (SKILLED_NURSING_FACILITY): Payer: Medicaid Other | Admitting: Internal Medicine

## 2015-08-24 ENCOUNTER — Encounter: Payer: Self-pay | Admitting: Internal Medicine

## 2015-08-24 DIAGNOSIS — K5909 Other constipation: Secondary | ICD-10-CM | POA: Diagnosis not present

## 2015-08-24 DIAGNOSIS — G893 Neoplasm related pain (acute) (chronic): Secondary | ICD-10-CM | POA: Diagnosis not present

## 2015-08-24 DIAGNOSIS — C799 Secondary malignant neoplasm of unspecified site: Secondary | ICD-10-CM | POA: Diagnosis not present

## 2015-08-24 DIAGNOSIS — F419 Anxiety disorder, unspecified: Secondary | ICD-10-CM | POA: Diagnosis not present

## 2015-08-24 DIAGNOSIS — C50919 Malignant neoplasm of unspecified site of unspecified female breast: Secondary | ICD-10-CM | POA: Diagnosis not present

## 2015-08-24 NOTE — Progress Notes (Signed)
Patient ID: Patricia Carter, female   DOB: 02-Dec-1955, 60 y.o.   MRN: [id number]    DATE: 08/24/15  Location:  [facility]    Place of Service: [phone])   Extended Emergency Contact Information Primary Emergency Contact: Notarmaso,Mariel Address: [place], [place] Phone: [phone] Mobile Phone: [phone] Relation: Daughter Secondary Emergency Contact: Cleon Gustin [place] Phone: [phone] Mobile Phone: [phone] Relation: Friend  Advanced Directive information    Chief Complaint  Patient presents with  . Acute Visit    HPI:  60 yo female seen today for uncontrolled pain and constipation. Spouse present. She reports severe uncontrolled pain especially in her back and ribs. She is taking norco q4hr prn but has to wait several minutes after asking for it. She has increased anxiety that improves with xanax but that too is prn. She has noted increased constipation and has not had a BM in 4 days. She feels bloated. She is taking senna daily. She attempts to push fluids and ambulates as tolerated. Appetite is poor.   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 She will be going home with hospice next week.  Past Medical History  Diagnosis Date  . Breast cancer ([place])   . Lung mass   . Anxiety about health     Past Surgical History  Procedure Laterality Date  . Mastectomy Left     2010  . Eye surgery      cataract surgery, wears contact left eye  . Subxyphoid pericardial window N/A 05/05/2014    Procedure: SUBXYPHOID PERICARDIAL WINDOW;  Surgeon: Gaye Pollack, MD;  Location: [place];  Service: Thoracic;  Laterality: N/A;  . Flexible bronchoscopy N/A 05/05/2014    Procedure: FLEXIBLE BRONCHOSCOPY;  Surgeon: Gaye Pollack, MD;  Location: [facility] OR;  Service: Thoracic;  Laterality: N/A;  . Video bronchoscopy with endobronchial ultrasound N/A 05/05/2014    Procedure: VIDEO  BRONCHOSCOPY WITH ENDOBRONCHIAL ULTRASOUND;  Surgeon: Gaye Pollack, MD;  Location: [place];  Service: Thoracic;  Laterality: N/A;    Patient Care Team: Gearlean Alf, PA-C as PCP - General (Physician Assistant) [facility] Tanda Rockers, MD as Consulting Physician (Pulmonary Disease)  Social History   Social History  . Marital Status: Single    Spouse Name: N/A  . Number of Children: 1  . Years of Education: N/A   Occupational History  . Works PT reception    Social History Main Topics  . Smoking status: Never Smoker   . Smokeless tobacco: Never Used  . Alcohol Use: 0.0 oz/week    0 Standard drinks or equivalent per week     Comment: occ  . Drug Use: No  . Sexual Activity: Not Currently   Other Topics Concern  . Not on file   Social History Narrative   Divorced.  Lives alone.     reports that she has never smoked. She has never used smokeless tobacco. She reports that she drinks alcohol. She reports that she does not use illicit drugs.  Immunization History  Administered Date(s) Administered  . Influenza,inj,Quad PF,36+ Mos 01/26/2015  . PPD Test 07/29/2015, 07/30/2015    Allergies  Allergen Reactions  . Iopamidol Hives    Uncoded Allergy. Allergen: DIZIPROMINE, Other Reaction: OTHER REACTION  . Omeprazole Other (See Comments)  Abdominal cramps and constipation   . Oxycodone Other (See Comments)    Migraines and dizziness   . Prednisone Palpitations  . Zyrtec [Cetirizine] Palpitations    Medications: Patient's Medications  New Prescriptions   No medications on file  Previous Medications   ALPRAZOLAM (XANAX) 0.5 MG TABLET    Take 1-2 mg by mouth every 8 (eight) hours as needed for anxiety.    ALUM & MAG HYDROXIDE-SIMETH (MYLANTA) 025-427-06 MG/5ML SUSPENSION    Take 30 mLs by mouth every 4 (four) hours as needed for indigestion.   ERGOCALCIFEROL (DRISDOL) 8000 UNIT/ML DROPS    Take 8,000 Units by mouth daily.    HYDROCODONE-ACETAMINOPHEN (NORCO) 10-325 MG TABLET    Take 1 tablet by mouth every 4 (four) hours as needed.   MAGNESIUM HYDROXIDE (MILK OF MAGNESIA) 400 MG/5ML SUSPENSION    Take 30 mLs by mouth daily as needed for mild constipation.   POLYETHYLENE GLYCOL (MIRALAX / GLYCOLAX) PACKET    Take 17 g by mouth 2 (two) times daily as needed for moderate constipation.   SENNA-DOCUSATE (SENOKOT-S) 8.6-50 MG TABLET    Take 1 tablet by mouth daily.   TRAMADOL (ULTRAM) 50 MG TABLET    Take 50 mg by mouth every 6 (six) hours as needed.  Modified Medications   No medications on file  Discontinued Medications   No medications on file    Review of Systems  Unable to perform ROS: Other  anxiety   Filed Vitals:   08/24/15 1601  BP: 98/67  Pulse: 92  Temp: 96 F (35.6 C)  Weight: 90 lb (40.824 kg)  SpO2: 97%   Body mass index is 15.95 kg/(m^2).  Physical Exam  Constitutional:  Frail appearing, temporal wasting in NAD, anxious  Neurological: She is alert.  Psychiatric: Her speech is normal. Thought content normal. Her mood appears anxious. She is agitated.  tearful     Labs reviewed: Admission on 08/03/2015, Discharged on 08/04/2015  Component Date Value Ref Range Status  . Lipase 08/03/2015 19  11 - 51 U/L Final  . Sodium 08/03/2015 136  135 - 145 mmol/L Final  . Potassium 08/03/2015 3.8  3.5 - 5.1 mmol/L Final  . Chloride 08/03/2015 98* 101 - 111 mmol/L Final  . CO2 08/03/2015 25  22 - 32 mmol/L Final  . Glucose, Bld 08/03/2015 95  65 - 99 mg/dL Final  . BUN 08/03/2015 9  6 - 20 mg/dL Final  . Creatinine, Ser 08/03/2015 0.77  0.44 - 1.00 mg/dL Final  . Calcium 08/03/2015 9.5  8.9 - 10.3 mg/dL Final  . Total Protein 08/03/2015 6.4* 6.5 - 8.1 g/dL Final  . Albumin 08/03/2015 3.3* 3.5 - 5.0 g/dL Final  . AST 08/03/2015 42* 15 - 41 U/L Final  . ALT 08/03/2015 39  14 - 54 U/L Final  . Alkaline Phosphatase 08/03/2015 66  38 - 126 U/L Final  . Total Bilirubin 08/03/2015 0.8  0.3 - 1.2  mg/dL Final  . GFR calc non Af Amer 08/03/2015 >60  >60 mL/min Final  . GFR calc Af Amer 08/03/2015 >60  >60 mL/min Final   Comment: (NOTE) The eGFR has been calculated using the CKD EPI equation. This calculation has not been validated in all clinical situations. eGFR's persistently <60 mL/min signify possible Chronic Kidney Disease.   . Anion gap 08/03/2015 13  5 - 15 Final  . WBC 08/03/2015 5.0  4.0 - 10.5 K/uL Final  . RBC 08/03/2015 4.90  3.87 - 5.11  MIL/uL Final  . Hemoglobin 08/03/2015 13.7  12.0 - 15.0 g/dL Final  . HCT 08/03/2015 40.6  36.0 - 46.0 % Final  . MCV 08/03/2015 82.9  78.0 - 100.0 fL Final  . MCH 08/03/2015 28.0  26.0 - 34.0 pg Final  . MCHC 08/03/2015 33.7  30.0 - 36.0 g/dL Final  . RDW 08/03/2015 12.9  11.5 - 15.5 % Final  . Platelets 08/03/2015 297  150 - 400 K/uL Final  . Color, Urine 08/04/2015 YELLOW  YELLOW Final  . APPearance 08/04/2015 CLOUDY* CLEAR Final  . Specific Gravity, Urine 08/04/2015 1.014  1.005 - 1.030 Final  . pH 08/04/2015 6.0  5.0 - 8.0 Final  . Glucose, UA 08/04/2015 NEGATIVE  NEGATIVE mg/dL Final  . Hgb urine dipstick 08/04/2015 NEGATIVE  NEGATIVE Final  . Bilirubin Urine 08/04/2015 NEGATIVE  NEGATIVE Final  . Ketones, ur 08/04/2015 15* NEGATIVE mg/dL Final  . Protein, ur 08/04/2015 NEGATIVE  NEGATIVE mg/dL Final  . Nitrite 08/04/2015 NEGATIVE  NEGATIVE Final  . Leukocytes, UA 08/04/2015 NEGATIVE  NEGATIVE Final   MICROSCOPIC NOT DONE ON URINES WITH NEGATIVE PROTEIN, BLOOD, LEUKOCYTES, NITRITE, OR GLUCOSE <1000 mg/dL.  . Troponin i, poc 08/04/2015 0.00  0.00 - 0.08 ng/mL Final  . Comment 3 08/04/2015          Final   Comment: Due to the release kinetics of cTnI, a negative result within the first hours of the onset of symptoms does not rule out myocardial infarction with certainty. If myocardial infarction is still suspected, repeat the test at appropriate intervals.   Admission on 07/27/2015, Discharged on 07/30/2015  Component  Date Value Ref Range Status  . Sodium 07/27/2015 134* 135 - 145 mmol/L Final  . Potassium 07/27/2015 3.8  3.5 - 5.1 mmol/L Final  . Chloride 07/27/2015 97* 101 - 111 mmol/L Final  . CO2 07/27/2015 27  22 - 32 mmol/L Final  . Glucose, Bld 07/27/2015 92  65 - 99 mg/dL Final  . BUN 07/27/2015 17  6 - 20 mg/dL Final  . Creatinine, Ser 07/27/2015 0.67  0.44 - 1.00 mg/dL Final  . Calcium 07/27/2015 9.3  8.9 - 10.3 mg/dL Final  . Total Protein 07/27/2015 6.4* 6.5 - 8.1 g/dL Final  . Albumin 07/27/2015 3.5  3.5 - 5.0 g/dL Final  . AST 07/27/2015 26  15 - 41 U/L Final  . ALT 07/27/2015 15  14 - 54 U/L Final  . Alkaline Phosphatase 07/27/2015 62  38 - 126 U/L Final  . Total Bilirubin 07/27/2015 0.7  0.3 - 1.2 mg/dL Final  . GFR calc non Af Amer 07/27/2015 >60  >60 mL/min Final  . GFR calc Af Amer 07/27/2015 >60  >60 mL/min Final   Comment: (NOTE) The eGFR has been calculated using the CKD EPI equation. This calculation has not been validated in all clinical situations. eGFR's persistently <60 mL/min signify possible Chronic Kidney Disease.   . Anion gap 07/27/2015 10  5 - 15 Final  . Magnesium 07/27/2015 1.9  1.7 - 2.4 mg/dL Final  . Phosphorus 07/27/2015 2.7  2.5 - 4.6 mg/dL Final  . WBC 07/27/2015 4.0  4.0 - 10.5 K/uL Final  . RBC 07/27/2015 4.76  3.87 - 5.11 MIL/uL Final  . Hemoglobin 07/27/2015 13.3  12.0 - 15.0 g/dL Final  . HCT 07/27/2015 38.5  36.0 - 46.0 % Final  . MCV 07/27/2015 80.9  78.0 - 100.0 fL Final  . MCH 07/27/2015 27.9  26.0 - 34.0 pg Final  .  MCHC 07/27/2015 34.5  30.0 - 36.0 g/dL Final  . RDW 07/27/2015 12.6  11.5 - 15.5 % Final  . Platelets 07/27/2015 281  150 - 400 K/uL Final  . Neutrophils Relative % 07/27/2015 73   Final  . Neutro Abs 07/27/2015 2.9  1.7 - 7.7 K/uL Final  . Lymphocytes Relative 07/27/2015 15   Final  . Lymphs Abs 07/27/2015 0.6* 0.7 - 4.0 K/uL Final  . Monocytes Relative 07/27/2015 12   Final  . Monocytes Absolute 07/27/2015 0.5  0.1 - 1.0 K/uL  Final  . Eosinophils Relative 07/27/2015 0   Final  . Eosinophils Absolute 07/27/2015 0.0  0.0 - 0.7 K/uL Final  . Basophils Relative 07/27/2015 0   Final  . Basophils Absolute 07/27/2015 0.0  0.0 - 0.1 K/uL Final  . D-Dimer, Quant 07/27/2015 1.54* 0.00 - 0.50 ug/mL-FEU Final   Comment: (NOTE) At the manufacturer cut-off of 0.50 ug/mL FEU, this assay has been documented to exclude PE with a sensitivity and negative predictive value of 97 to 99%.  At this time, this assay has not been approved by the FDA to exclude DVT/VTE. Results should be correlated with clinical presentation.   . Fluid Type-FCT 07/27/2015 PLEURAL   Final  . Color, Fluid 07/27/2015 RED* YELLOW Final  . Appearance, Fluid 07/27/2015 CLOUDY* CLEAR Final  . WBC, Fluid 07/27/2015 477  0 - 1000 cu mm Final  . Neutrophil Count, Fluid 07/27/2015 0  0 - 25 % Final  . Lymphs, Fluid 07/27/2015 54   Final  . Monocyte-Macrophage-Serous Fluid 07/27/2015 46* 50 - 90 % Final  . Eos, Fluid 07/27/2015 0   Final  . Other Cells, Fluid 07/27/2015 CORRELATE WITH CYTOLOGY.   Final  . LD, Fluid 07/27/2015 1330* 3 - 23 U/L Final   Comment: (NOTE) Results should be evaluated in conjunction with serum values   . Fluid Type-FLDH 07/27/2015 PLEURAL   Final   Performed at [facility]  . Specimen Description 07/27/2015 FLUID PLEURAL   Final  . Special Requests 07/27/2015 BOTTLES DRAWN AEROBIC AND ANAEROBIC 5CC   Final  . Culture 07/27/2015    Final                   Value:NO GROWTH 5 DAYS Performed at [facility]   . Report Status 07/27/2015 08/01/2015 FINAL   Final  . Specimen Description 07/27/2015 FLUID PLEURAL   Final  . Special Requests 07/27/2015 NONE   Final  . Gram Stain 07/27/2015    Final                   Value:MODERATE WBC PRESENT, PREDOMINANTLY MONONUCLEAR NO ORGANISMS SEEN Performed at [facility]   . Report Status 07/27/2015 07/27/2015 FINAL   Final  Appointment on 07/19/2015  Component Date  Value Ref Range Status  . WBC 07/19/2015 4.9  3.9 - 10.3 10e3/uL Final  . NEUT# 07/19/2015 3.6  1.5 - 6.5 10e3/uL Final  . HGB 07/19/2015 13.7  11.6 - 15.9 g/dL Final  . HCT 07/19/2015 41.6  34.8 - 46.6 % Final  . Platelets 07/19/2015 290  145 - 400 10e3/uL Final  . MCV 07/19/2015 84.4  79.5 - 101.0 fL Final  . MCH 07/19/2015 27.8  25.1 - 34.0 pg Final  . MCHC 07/19/2015 33.0  31.5 - 36.0 g/dL Final  . RBC 07/19/2015 4.93  3.70 - 5.45 10e6/uL Final  . RDW 07/19/2015 13.1  11.2 - 14.5 % Final  .  lymph# 07/19/2015 0.5* 0.9 - 3.3 10e3/uL Final  . MONO# 07/19/2015 0.8  0.1 - 0.9 10e3/uL Final  . Eosinophils Absolute 07/19/2015 0.0  0.0 - 0.5 10e3/uL Final  . Basophils Absolute 07/19/2015 0.0  0.0 - 0.1 10e3/uL Final  . NEUT% 07/19/2015 73.2  38.4 - 76.8 % Final  . LYMPH% 07/19/2015 10.4* 14.0 - 49.7 % Final  . MONO% 07/19/2015 15.7* 0.0 - 14.0 % Final  . EOS% 07/19/2015 0.3  0.0 - 7.0 % Final  . BASO% 07/19/2015 0.4  0.0 - 2.0 % Final  . Sodium 07/19/2015 137  136 - 145 mEq/L Final  . Potassium 07/19/2015 4.0  3.5 - 5.1 mEq/L Final  . Chloride 07/19/2015 98  98 - 109 mEq/L Final  . CO2 07/19/2015 28  22 - 29 mEq/L Final  . Glucose 07/19/2015 113  70 - 140 mg/dl Final   Glucose reference range is for nonfasting patients. Fasting glucose reference range is 70- 100.  Marland Kitchen BUN 07/19/2015 14.1  7.0 - 26.0 mg/dL Final  . Creatinine 07/19/2015 1.0  0.6 - 1.1 mg/dL Final  . Total Bilirubin 07/19/2015 0.50  0.20 - 1.20 mg/dL Final  . Alkaline Phosphatase 07/19/2015 72  40 - 150 U/L Final  . AST 07/19/2015 27  5 - 34 U/L Final  . ALT 07/19/2015 15  0 - 55 U/L Final  . Total Protein 07/19/2015 7.5  6.4 - 8.3 g/dL Final  . Albumin 07/19/2015 4.0  3.5 - 5.0 g/dL Final  . Calcium 07/19/2015 10.0  8.4 - 10.4 mg/dL Final  . Anion Gap 07/19/2015 10  3 - 11 mEq/L Final  . EGFR 07/19/2015 63* >90 ml/min/1.73 m2 Final   eGFR is calculated using the CKD-EPI Creatinine Equation (2009)  Admission on  07/17/2015, Discharged on 07/17/2015  Component Date Value Ref Range Status  . Troponin i, poc 07/17/2015 0.02  0.00 - 0.08 ng/mL Final  . Comment 3 07/17/2015          Final   Comment: Due to the release kinetics of cTnI, a negative result within the first hours of the onset of symptoms does not rule out myocardial infarction with certainty. If myocardial infarction is still suspected, repeat the test at appropriate intervals.   Admission on 06/12/2015, Discharged on 06/14/2015  Component Date Value Ref Range Status  . Lipase 06/12/2015 24  11 - 51 U/L Final  . Sodium 06/12/2015 143  135 - 145 mmol/L Final  . Potassium 06/12/2015 3.8  3.5 - 5.1 mmol/L Final  . Chloride 06/12/2015 105  101 - 111 mmol/L Final  . CO2 06/12/2015 26  22 - 32 mmol/L Final  . Glucose, Bld 06/12/2015 97  65 - 99 mg/dL Final  . BUN 06/12/2015 11  6 - 20 mg/dL Final  . Creatinine, Ser 06/12/2015 0.81  0.44 - 1.00 mg/dL Final  . Calcium 06/12/2015 9.6  8.9 - 10.3 mg/dL Final  . Total Protein 06/12/2015 6.9  6.5 - 8.1 g/dL Final  . Albumin 06/12/2015 4.4  3.5 - 5.0 g/dL Final  . AST 06/12/2015 21  15 - 41 U/L Final  . ALT 06/12/2015 11* 14 - 54 U/L Final  . Alkaline Phosphatase 06/12/2015 78  38 - 126 U/L Final  . Total Bilirubin 06/12/2015 0.4  0.3 - 1.2 mg/dL Final  . GFR calc non Af Amer 06/12/2015 >60  >60 mL/min Final  . GFR calc Af Amer 06/12/2015 >60  >60 mL/min Final   Comment: (NOTE) The  eGFR has been calculated using the CKD EPI equation. This calculation has not been validated in all clinical situations. eGFR's persistently <60 mL/min signify possible Chronic Kidney Disease.   . Anion gap 06/12/2015 12  5 - 15 Final  . WBC 06/12/2015 4.7  4.0 - 10.5 K/uL Final  . RBC 06/12/2015 4.78  3.87 - 5.11 MIL/uL Final  . Hemoglobin 06/12/2015 13.8  12.0 - 15.0 g/dL Final  . HCT 06/12/2015 40.9  36.0 - 46.0 % Final  . MCV 06/12/2015 85.6  78.0 - 100.0 fL Final  . MCH 06/12/2015 28.9  26.0 - 34.0 pg  Final  . MCHC 06/12/2015 33.7  30.0 - 36.0 g/dL Final  . RDW 06/12/2015 13.0  11.5 - 15.5 % Final  . Platelets 06/12/2015 249  150 - 400 K/uL Final  . Color, Urine 06/12/2015 YELLOW  YELLOW Final  . APPearance 06/12/2015 CLEAR  CLEAR Final  . Specific Gravity, Urine 06/12/2015 1.010  1.005 - 1.030 Final  . pH 06/12/2015 7.5  5.0 - 8.0 Final  . Glucose, UA 06/12/2015 NEGATIVE  NEGATIVE mg/dL Final  . Hgb urine dipstick 06/12/2015 NEGATIVE  NEGATIVE Final  . Bilirubin Urine 06/12/2015 NEGATIVE  NEGATIVE Final  . Ketones, ur 06/12/2015 NEGATIVE  NEGATIVE mg/dL Final  . Protein, ur 06/12/2015 NEGATIVE  NEGATIVE mg/dL Final  . Nitrite 06/12/2015 NEGATIVE  NEGATIVE Final  . Leukocytes, UA 06/12/2015 NEGATIVE  NEGATIVE Final   MICROSCOPIC NOT DONE ON URINES WITH NEGATIVE PROTEIN, BLOOD, LEUKOCYTES, NITRITE, OR GLUCOSE <1000 mg/dL.  . Strep Pneumo Urinary Antigen 06/12/2015 NEGATIVE  NEGATIVE Final   Comment:        Infection due to S. pneumoniae cannot be absolutely ruled out since the antigen present may be below the detection limit of the test. Performed at [facility]   . Specimen Description 06/12/2015 URINE, CLEAN CATCH   Final  . Special Requests 06/12/2015 NONE   Final  . Legionella Antigen, Urine 06/12/2015    Final                   Value:Negative for Legionella pneumophila serogroup 1                                                              Legionella pneumophila serogroup 1 antigen can be detected in urine within 2 to 3 days of infection and may persist even after treatment. This  assay does not detect other Legionella species or serogroups. Performed at [facility]   . Report Status 06/12/2015 06/14/2015 FINAL   Final  . WBC 06/13/2015 4.1  4.0 - 10.5 K/uL Final  . RBC 06/13/2015 4.31  3.87 - 5.11 MIL/uL Final  . Hemoglobin 06/13/2015 12.0  12.0 - 15.0 g/dL Final  . HCT 06/13/2015 37.4  36.0 - 46.0 % Final  . MCV 06/13/2015 86.8  78.0 - 100.0 fL Final    . MCH 06/13/2015 27.8  26.0 - 34.0 pg Final  . MCHC 06/13/2015 32.1  30.0 - 36.0 g/dL Final  . RDW 06/13/2015 13.1  11.5 - 15.5 % Final  . Platelets 06/13/2015 226  150 - 400 K/uL Final  . Sodium 06/13/2015 138  135 - 145 mmol/L Final  . Potassium 06/13/2015 4.3  3.5 - 5.1 mmol/L Final  .  Chloride 06/13/2015 106  101 - 111 mmol/L Final  . CO2 06/13/2015 25  22 - 32 mmol/L Final  . Glucose, Bld 06/13/2015 94  65 - 99 mg/dL Final  . BUN 06/13/2015 7  6 - 20 mg/dL Final  . Creatinine, Ser 06/13/2015 0.67  0.44 - 1.00 mg/dL Final  . Calcium 06/13/2015 8.6* 8.9 - 10.3 mg/dL Final  . GFR calc non Af Amer 06/13/2015 >60  >60 mL/min Final  . GFR calc Af Amer 06/13/2015 >60  >60 mL/min Final   Comment: (NOTE) The eGFR has been calculated using the CKD EPI equation. This calculation has not been validated in all clinical situations. eGFR's persistently <60 mL/min signify possible Chronic Kidney Disease.   . Anion gap 06/13/2015 7  5 - 15 Final  Appointment on 06/01/2015  Component Date Value Ref Range Status  . WBC 06/01/2015 4.8  3.9 - 10.3 10e3/uL Final  . NEUT# 06/01/2015 3.4  1.5 - 6.5 10e3/uL Final  . HGB 06/01/2015 14.4  11.6 - 15.9 g/dL Final  . HCT 06/01/2015 42.2  34.8 - 46.6 % Final  . Platelets 06/01/2015 244  145 - 400 10e3/uL Final  . MCV 06/01/2015 84.2  79.5 - 101.0 fL Final  . MCH 06/01/2015 28.7  25.1 - 34.0 pg Final  . MCHC 06/01/2015 34.1  31.5 - 36.0 g/dL Final  . RBC 06/01/2015 5.01  3.70 - 5.45 10e6/uL Final  . RDW 06/01/2015 12.9  11.2 - 14.5 % Final  . lymph# 06/01/2015 0.6* 0.9 - 3.3 10e3/uL Final  . MONO# 06/01/2015 0.8  0.1 - 0.9 10e3/uL Final  . Eosinophils Absolute 06/01/2015 0.1  0.0 - 0.5 10e3/uL Final  . Basophils Absolute 06/01/2015 0.0  0.0 - 0.1 10e3/uL Final  . NEUT% 06/01/2015 71.2  38.4 - 76.8 % Final  . LYMPH% 06/01/2015 11.4* 14.0 - 49.7 % Final  . MONO% 06/01/2015 16.0* 0.0 - 14.0 % Final  . EOS% 06/01/2015 1.2  0.0 - 7.0 % Final  . BASO%  06/01/2015 0.2  0.0 - 2.0 % Final    Dg Chest 1 View  07/27/2015  CLINICAL DATA:  Status post right thoracentesis EXAM: CHEST 1 VIEW COMPARISON:  July 27, 2015 11:33 a.m. FINDINGS: There is persistent large right pleural effusion without significant interval change. Consolidation of the right mid and lower lung are identified. The left lung is clear. There is no pneumothorax. The osseous structures are stable. IMPRESSION: Large right pleural effusion unchanged compared prior exam. There is no pneumothorax. Electronically Signed   By: Abelardo Diesel M.D.   On: 07/27/2015 16:18   Dg Chest 2 View  08/03/2015  CLINICAL DATA:  Chest pain. EXAM: CHEST  2 VIEW COMPARISON:  Most recent radiograph 07/27/2015. Chest CT 06/12/2015. FINDINGS: Large right pleural effusion occupying the lower 2/3 of right hemithorax, mild increase in size compared to prior. Small nodular opacities in the aerated right upper lobe again seen. No pneumothorax. Left lung is clear. Cardiomediastinal contours are unchanged. Osseous structures are unchanged. IMPRESSION: Increased size large right pleural effusion. Nodular opacities in the aerated right upper lung again seen. Electronically Signed   By: Jeb Levering M.D.   On: 08/03/2015 23:36   Ct Abdomen Pelvis W Contrast  08/04/2015  CLINICAL DATA:  Metastatic breast carcinoma with right lower chest and abdominal pain. Nausea and constipation EXAM: CT ABDOMEN AND PELVIS WITH CONTRAST TECHNIQUE: Multidetector CT imaging of the abdomen and pelvis was performed using the standard protocol following bolus administration  of intravenous contrast. CONTRAST:  75 mL ISOVUE-300 IOPAMIDOL (ISOVUE-300) INJECTION 61% COMPARISON:  December 09, 2014; May 23, 2015 FINDINGS: Lower chest: There is extensive pleural effusion in the right base with a sizable subpulmonic component. There is airspace consolidation in the right lower lobe and portions of the right middle lobe as well as scarring in portions of  the right middle lobe. Visualized left lung is clear. Breast implants are noted bilaterally. Hepatobiliary: No focal liver lesions are evident. There is fatty infiltration near the fissure for the ligamentum teres. Gallbladder wall is not appreciably thickened. There is no biliary duct dilatation. Pancreas: No pancreatic mass or inflammatory focus. Spleen: No splenic lesions evident. Adrenals/Urinary Tract: Adrenals appear normal bilaterally. The right kidney is somewhat inferior and anterior in location, a change from prior studies. There is no renal mass or hydronephrosis on either side. No renal or ureteral calculus on either side. Urinary bladder is midline with wall thickness within normal limits. Stomach/Bowel: There is moderate stool throughout the colon. The rectum is somewhat distended with stool. The rectal wall is not thickened. There is no bowel wall or mesenteric thickening. No bowel obstruction. No free air or portal venous air. Vascular/Lymphatic: There is atherosclerotic calcification in the aorta and proximal iliac arteries. There is no abdominal aortic aneurysm. The major mesenteric vessels appear patent. There is no demonstrable adenopathy in the abdomen or pelvis. Reproductive: No pelvic mass or pelvic fluid collection is apparent. Other: There is no periappendiceal region inflammation. No abscess or ascites evident in the abdomen or pelvis. Musculoskeletal: No blastic or lytic bone lesions are evident on the current examination. There is no intramuscular or abdominal wall lesion. IMPRESSION: Sizable right pleural effusion with significant subpulmonic component. Areas of consolidation in the right middle and lower lobe regions with scarring in the right middle lobe. Ptotic right kidney without hydronephrosis or renal mass. Stool throughout colon. Rectum is distended with stool. The rectal wall does not appear appreciably thickened. No bowel obstruction.  No abscess. No intra-abdominal or pelvic  metastatic foci evident. Electronically Signed   By: Lowella Grip III M.D.   On: 08/04/2015 07:49   Portable Chest 1 View  07/27/2015  CLINICAL DATA:  Right pleuritic chest pain. Patient has thoracentesis July 23, 2015 EXAM: PORTABLE CHEST 1 VIEW COMPARISON:  July 24, 2015 FINDINGS: The heart size and mediastinal contours are stable. There is a large right pleural effusion with consolidation of the right mid and lower lung. The left lung is hyperinflated. The visualized skeletal structures are stable. IMPRESSION: Large right pleural effusion with consolidation of the right mid and lower lung. Electronically Signed   By: Abelardo Diesel M.D.   On: 07/27/2015 12:55   Dg Abd 2 Views  08/04/2015  CLINICAL DATA:  Left lower quadrant abdominal pain EXAM: ABDOMEN - 2 VIEW COMPARISON:  06/12/2015 FINDINGS: The abdominal gas pattern is negative for obstruction or perforation. No biliary or urinary calculi are evident. The upright view of the chest demonstrates a large right pleural effusion which is increased since 06/12/2015. There is consolidation of right lung parenchyma adjacent to the large pleural fluid collection. The left lung is clear. IMPRESSION: 1. Normal abdominal gas pattern. 2. Increasing right pleural effusion. Electronically Signed   By: Andreas Newport M.D.   On: 08/04/2015 00:24   US Thoracentesis Asp Pleural Space W/img Guide  07/27/2015  INDICATION: History of breast cancer with evidence of metastatic lung disease. She has a recurrent right pleural effusion with chronic right  pleuritic chest pain. Requested been made for diagnostic and therapeutic thoracentesis today. EXAM: ULTRASOUND GUIDED DIAGNOSTIC AND THERAPEUTIC THORACENTESIS MEDICATIONS: 1% lidocaine COMPLICATIONS: None immediate. PROCEDURE: An ultrasound guided thoracentesis was thoroughly discussed with the patient and questions answered. The benefits, risks, alternatives and complications were also discussed. The patient  understands and wishes to proceed with the procedure. Written consent was obtained. Ultrasound was performed to localize and mark an adequate pocket of fluid in the right chest. The area was then prepped and draped in the normal sterile fashion. 1% Lidocaine was used for local anesthesia. A Safe-T-Centesis catheter was introduced. Thoracentesis was performed. The catheter was removed and a dressing applied. FINDINGS: A total of approximately 0.65 L of serosanguineous fluid was removed. Samples were sent to the laboratory as requested by the clinical team. IMPRESSION: Successful ultrasound guided right thoracentesis yielding 0.65 L of pleural fluid. Once again her procedure was terminated at the patient's request secondary to chest pain. Read by: Saverio Danker, PA-C Electronically Signed   By: Markus Daft M.D.   On: 07/27/2015 15:53     Assessment/Plan   ICD-9-CM ICD-10-CM   1. Cancer associated pain - uncontrolled 338.3 G89.3   2. Other constipation - due to #1 564.09 K59.09   3. Anxiety - uncontrolled 300.00 F41.9   4. Metastatic breast cancer (HCC) 174.9 C50.919    199.1 C79.9     change xanax 0.22m q4hrs prn anxiety -1st dose now. Take 2 tabs qHS  - standing order  Cont norco prn. Start fentanyl patch 162m/h TD q72hr, hold for sedation/respiratory depression. T/c roxanol if pt does not tolerate patch  Fleets enema x 1 now  D/c senna  Dulcolax supp PR prn constipation  miralax 17gm po daily for constipation  T/c linzess if constipation does not improve  Bedside commode  Hospice consult for breast CA with mets  Will follow  Time spent 30 minutes with pt with > 50% time counseling/coordinating care  Verdelle Valtierra S. CaPerlie GoldPiLargo [facility] [address] [phone] (Monday-Friday 8 AM - 5 PM) ([phone] 5 PM and follow prompts

## 2015-08-26 ENCOUNTER — Non-Acute Institutional Stay (SKILLED_NURSING_FACILITY): Payer: Medicaid Other | Admitting: Adult Health

## 2015-08-26 ENCOUNTER — Encounter: Payer: Self-pay | Admitting: Adult Health

## 2015-08-26 DIAGNOSIS — R530 Neoplastic (malignant) related fatigue: Secondary | ICD-10-CM | POA: Diagnosis not present

## 2015-08-26 DIAGNOSIS — G893 Neoplasm related pain (acute) (chronic): Secondary | ICD-10-CM

## 2015-08-26 DIAGNOSIS — C50911 Malignant neoplasm of unspecified site of right female breast: Secondary | ICD-10-CM | POA: Diagnosis not present

## 2015-08-26 DIAGNOSIS — C7800 Secondary malignant neoplasm of unspecified lung: Secondary | ICD-10-CM | POA: Diagnosis not present

## 2015-08-26 NOTE — Progress Notes (Signed)
Patient ID: Patricia Carter, female   DOB: [birth year], 60 y.o.   MRN: [id number]   Facility: [facility]       Allergies  Allergen Reactions  . Iopamidol Hives    Uncoded Allergy. Allergen: DIZIPROMINE, Other Reaction: OTHER REACTION  . Omeprazole Other (See Comments)    Abdominal cramps and constipation   . Oxycodone Other (See Comments)    Migraines and dizziness   . Prednisone Palpitations  . Zyrtec [Cetirizine] Palpitations    Chief Complaint  Patient presents with  . Discharge Note    Discharge from facility    HPI:  She is being discharged to home with hospice care. She will need a 3:1 commode. She will need her prescriptions to be written. She will follow up medical with the hospice providers. She is gladand nervous about going home.   Past Medical History  Diagnosis Date  . Breast cancer ([place])   . Lung mass   . Anxiety about health     Past Surgical History  Procedure Laterality Date  . Mastectomy Left     2010  . Eye surgery      cataract surgery, wears contact left eye  . Subxyphoid pericardial window N/A 05/05/2014    Procedure: SUBXYPHOID PERICARDIAL WINDOW;  Surgeon: Gaye Pollack, MD;  Location: [facility];  Service: Thoracic;  Laterality: N/A;  . Flexible bronchoscopy N/A 05/05/2014    Procedure: FLEXIBLE BRONCHOSCOPY;  Surgeon: Gaye Pollack, MD;  Location: [facility];  Service: Thoracic;  Laterality: N/A;  . Video bronchoscopy with endobronchial ultrasound N/A 05/05/2014    Procedure: VIDEO BRONCHOSCOPY WITH ENDOBRONCHIAL ULTRASOUND;  Surgeon: Gaye Pollack, MD;  Location: [facility];  Service: Thoracic;  Laterality: N/A;    VITAL SIGNS BP 98/67 mmHg  Pulse 92  Temp(Src) 96 F (35.6 C) (Oral)  Resp 18  Ht 5\' 3"  (1.6 m)  Wt 90 lb (40.824 kg)  BMI 15.95 kg/m2  SpO2 97%  Patient's Medications  New Prescriptions   No medications on file  Previous Medications   ALPRAZOLAM (XANAX) 0.5 MG TABLET    Take 1 mg by mouth every 4 (four) hours as needed for anxiety.  Give 2 tablets at bedtime   ALUM & MAG HYDROXIDE-SIMETH (MYLANTA) 200-200-20 MG/5ML SUSPENSION    Take 30 mLs by mouth every 4 (four) hours as needed for indigestion.   BISACODYL (DULCOLAX) 10 MG SUPPOSITORY    Place 10 mg rectally as needed for moderate constipation.   ERGOCALCIFEROL (DRISDOL) 8000 UNIT/ML DROPS    Take 8,000 Units by mouth daily.   FENTANYL (DURAGESIC - DOSED MCG/HR) 12 MCG/HR    Place 12.5 mcg onto the skin every 3 (three) days.   HYDROCODONE-ACETAMINOPHEN (NORCO) 10-325 MG TABLET    Take 1 tablet by mouth every 4 (four) hours as needed.   MAGNESIUM HYDROXIDE (MILK OF MAGNESIA) 400 MG/5ML SUSPENSION    Take 30 mLs by mouth daily as needed for mild constipation.   NUTRITIONAL SUPPLEMENTS (NUTRITIONAL SUPPLEMENT PO)    Take 120 mg by mouth 3 (three) times daily. Med Pass   POLYETHYLENE GLYCOL (MIRALAX / GLYCOLAX) PACKET    Take 17 g by mouth 2 (two) times daily as needed for moderate constipation.   TRAMADOL (ULTRAM) 50 MG TABLET    Take 50 mg by mouth every 6 (six) hours as needed.  Modified Medications   No medications on file  Discontinued Medications   SENNA-DOCUSATE (SENOKOT-S) 8.6-50 MG TABLET    Take 1 tablet by  mouth daily.     SIGNIFICANT DIAGNOSTIC EXAMS  08-04-15: ct of abdomen and pelvis: Sizable right pleural effusion with significant subpulmonic component. Areas of consolidation in the right middle and lower lobe regions with scarring in the right middle lobe. Ptotic right kidney without hydronephrosis or renal mass. Stool throughout colon. Rectum is distended with stool. The rectal wall does not appear appreciably thickened. No bowel obstruction.  No abscess. No intra-abdominal or pelvic metastatic foci evident.    LABS REVIEWED:   08-03-15; wbc 5.0; hgb 13.;7 hct 40.6; mcv 82.9; plt 297; glucose 95; bun 9; creat 0.77; k+ 3.8; na++136; ast 42; albumin 3.3   Review of Systems  Constitutional: Negative for malaise/fatigue.  Respiratory: Negative for cough  and shortness of breath.   Cardiovascular: Negative for chest pain, palpitations and leg swelling.  Gastrointestinal: Negative for heartburn, abdominal pain and constipation.  Musculoskeletal: Positive for myalgias and joint pain. Negative for back pain.       Has bilateral rib cage area pain right >left   Skin: Negative.   Neurological: Negative for dizziness.  Psychiatric/Behavioral: Positive for depression. The patient is nervous/anxious.     Physical Exam  Constitutional: She is oriented to person, place, and time. She appears distressed.  Mildly distressed Is frail   Eyes: Conjunctivae are normal.  Neck: Neck supple. No JVD present. No thyromegaly present.  Cardiovascular: Normal rate, regular rhythm and intact distal pulses.   Respiratory: Effort normal and breath sounds normal. No respiratory distress. She has no wheezes.  GI: Soft. Bowel sounds are normal. She exhibits no distension. There is no tenderness.  Musculoskeletal: She exhibits no edema.  Able to move all extremities   Lymphadenopathy:    She has no cervical adenopathy.  Neurological: She is alert and oriented to person, place, and time.  Skin: Skin is warm and dry. She is not diaphoretic.  Psychiatric:  Anxious and tearful       ASSESSMENT/ PLAN:  Will discharge her to home with hospice care. Will need a 3:1 commode. Her prescriptions have been written for a 30 day supply of her medications with #30 ultram 50 mg tabs; #30 vicodin 10/35 mg tabs; #60 xanax 0.5 mg tabs. She will follow up medically with hospice care.    Time spent with patient  40  minutes >50% time spent counseling; reviewing medical record; tests; labs; and developing future plan of care         Ok Edwards NP [facility]  Contact [phone] Monday through Friday 8am- 5pm  After hours call [phone]

## 2015-08-27 ENCOUNTER — Telehealth: Payer: Self-pay | Admitting: *Deleted

## 2015-08-27 NOTE — Telephone Encounter (Signed)
Per Manus Gunning:  Patient is going home to her apartment in [place].  [facility] will be assisting her.

## 2015-08-27 NOTE — Telephone Encounter (Signed)
Manus Gunning with Hospice called.  Patient will be discharged from rehab facility [facility]?  On Monday.  Patient would like to go home with Hospice.  Would Dr. Burr Medico be willing to be the attending?  They will still do symptom management.    Discussed with Dr. Burr Medico.  She is willing to be the attending.   Left confidential  VM on Fran's phone - [phone]

## 2015-09-03 ENCOUNTER — Telehealth: Payer: Self-pay | Admitting: *Deleted

## 2015-09-03 NOTE — Telephone Encounter (Signed)
Bernadene Person called to notify us that patient has been moved to [facility] in [facility] for [employer].  Dr. Burr Medico notified.  Regina's call back is [phone].

## 2015-10-10 DIAGNOSIS — Z7189 Other specified counseling: Secondary | ICD-10-CM | POA: Insufficient documentation

## 2015-10-10 DIAGNOSIS — J90 Pleural effusion, not elsewhere classified: Secondary | ICD-10-CM | POA: Insufficient documentation

## 2015-10-12 ENCOUNTER — Telehealth: Payer: Self-pay | Admitting: *Deleted

## 2015-10-12 NOTE — Telephone Encounter (Signed)
Received vm call from [facility] with a courtesy call to inform that pt passed 10-30-15 @ 3:42 pm.  Informed Dr Burr Medico & HIM.

## 2015-10-30 DEATH — deceased

## 2015-12-29 DIAGNOSIS — C50919 Malignant neoplasm of unspecified site of unspecified female breast: Secondary | ICD-10-CM | POA: Insufficient documentation

## 2015-12-29 DIAGNOSIS — Z515 Encounter for palliative care: Secondary | ICD-10-CM | POA: Insufficient documentation

## 2015-12-29 DIAGNOSIS — Z66 Do not resuscitate: Secondary | ICD-10-CM | POA: Insufficient documentation

## 2016-04-15 ENCOUNTER — Other Ambulatory Visit: Payer: Self-pay | Admitting: Nurse Practitioner

## 2016-05-31 IMAGING — CR DG CHEST 2V
2 series · 2 of 2 positions shown · non-contrast
Comparison: PA and lateral chest x-ray May 09, 2014

CLINICAL DATA: Acute shortness of breath ; subxiphoid pericardial
window created on May 05, 2014 or pericardial effusions;
bronchoscopy the same day

EXAM:
CHEST  2 VIEW

[w chest pa]
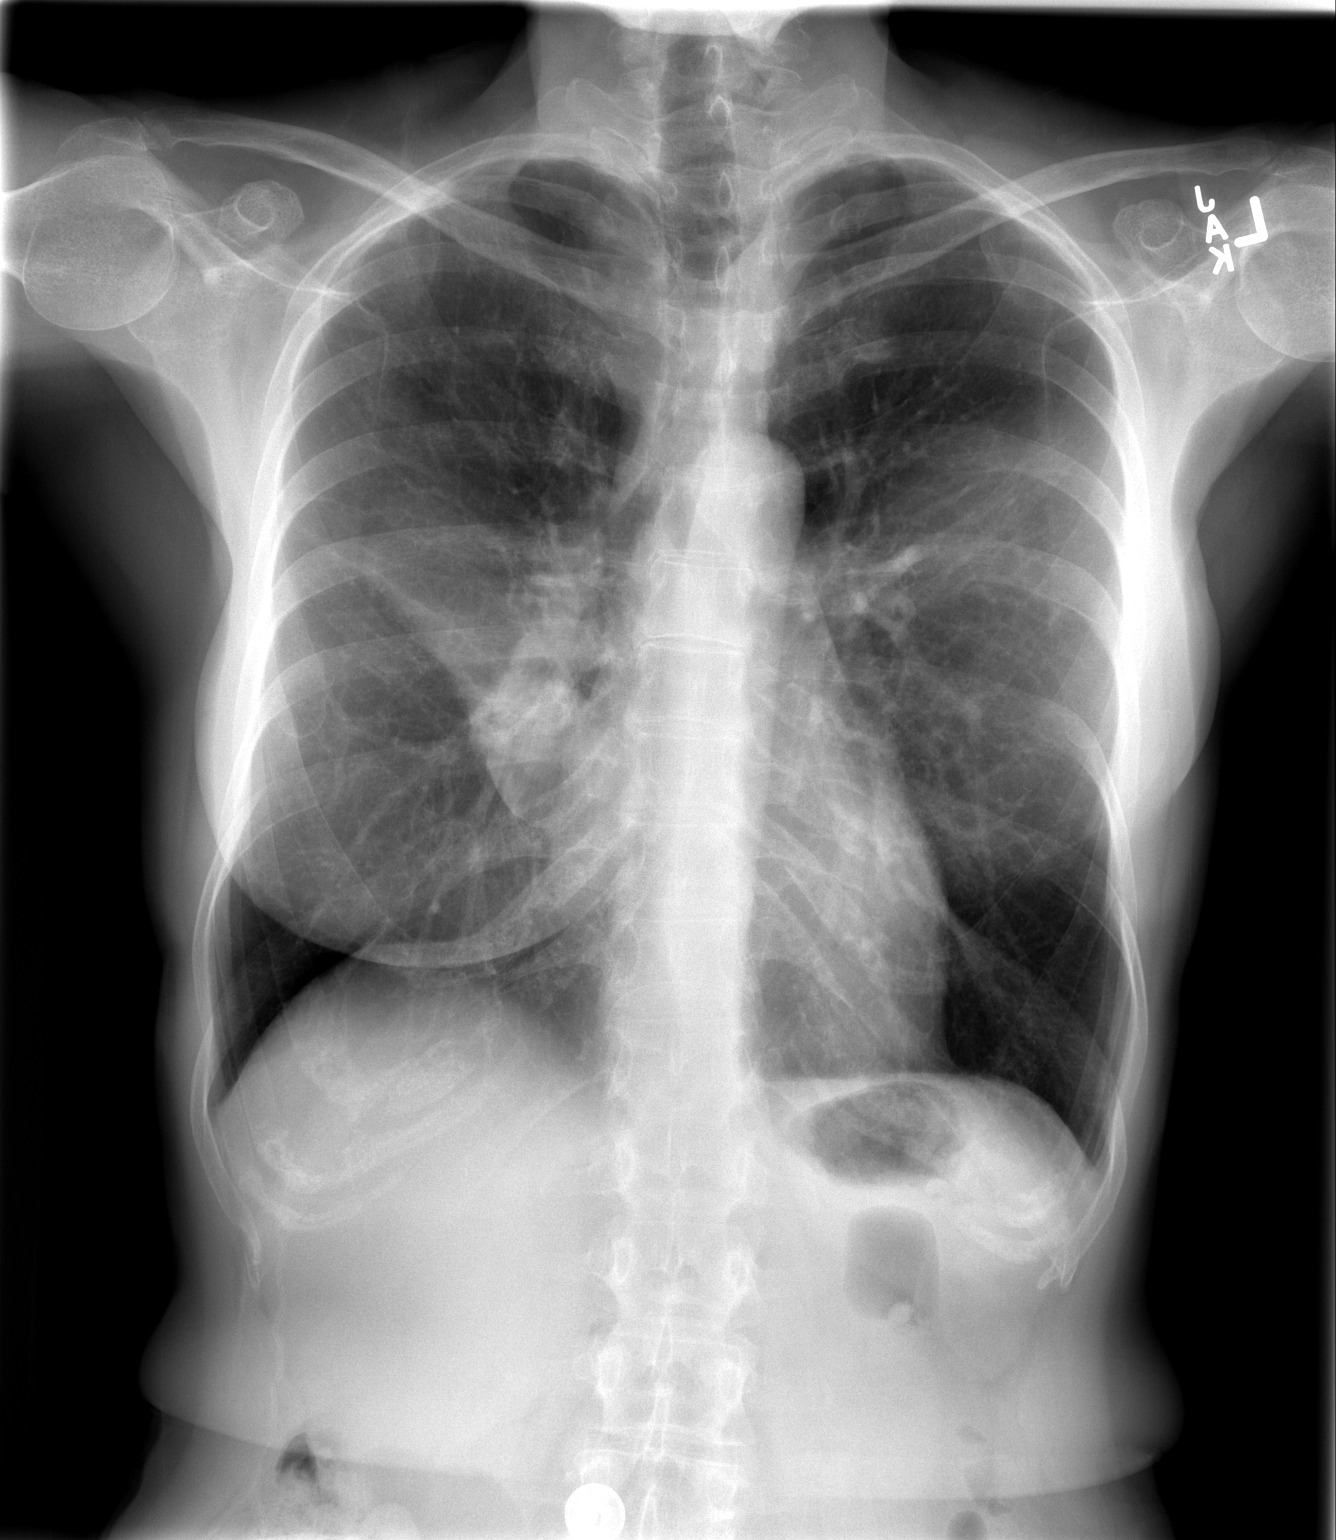

[w chest lat]
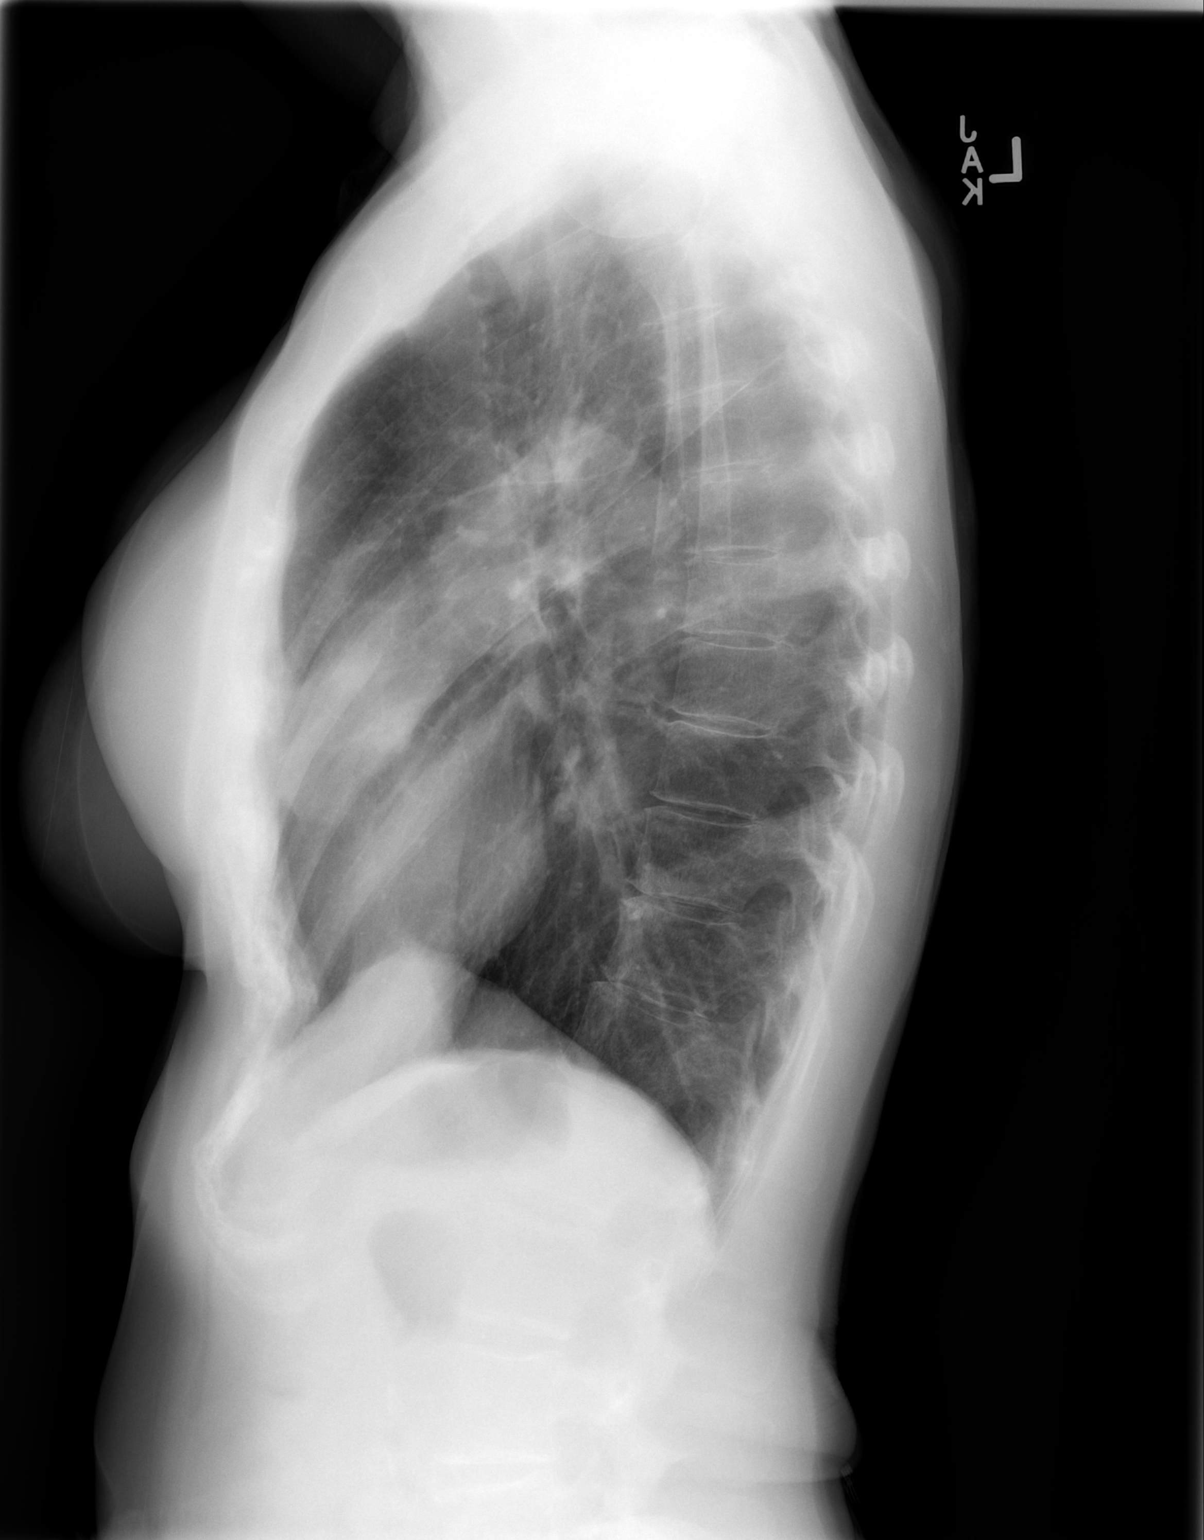

[2 of 2 positions shown; findings below may reference images not displayed]

FINDINGS: The lungs remain hyperinflated. Atelectasis at the left lung base
has cleared. There remain prominent right hilar lung markings
unchanged from the previous study. There is no alveolar infiltrate.
The heart and pulmonary vascularity are normal. The bony thorax is
unremarkable.
IMPRESSION: COPD with interval clearing of left lower lobe atelectasis.
Persistent right perihilar soft tissue mass-like density is little
changed from the previous study.

## 2017-07-14 IMAGING — CR DG CHEST 2V
2 series · 2 of 2 positions shown · non-contrast
Comparison: June 12, 2015

CLINICAL DATA: Shortness of breath

EXAM:
CHEST  2 VIEW

[w chest pa]
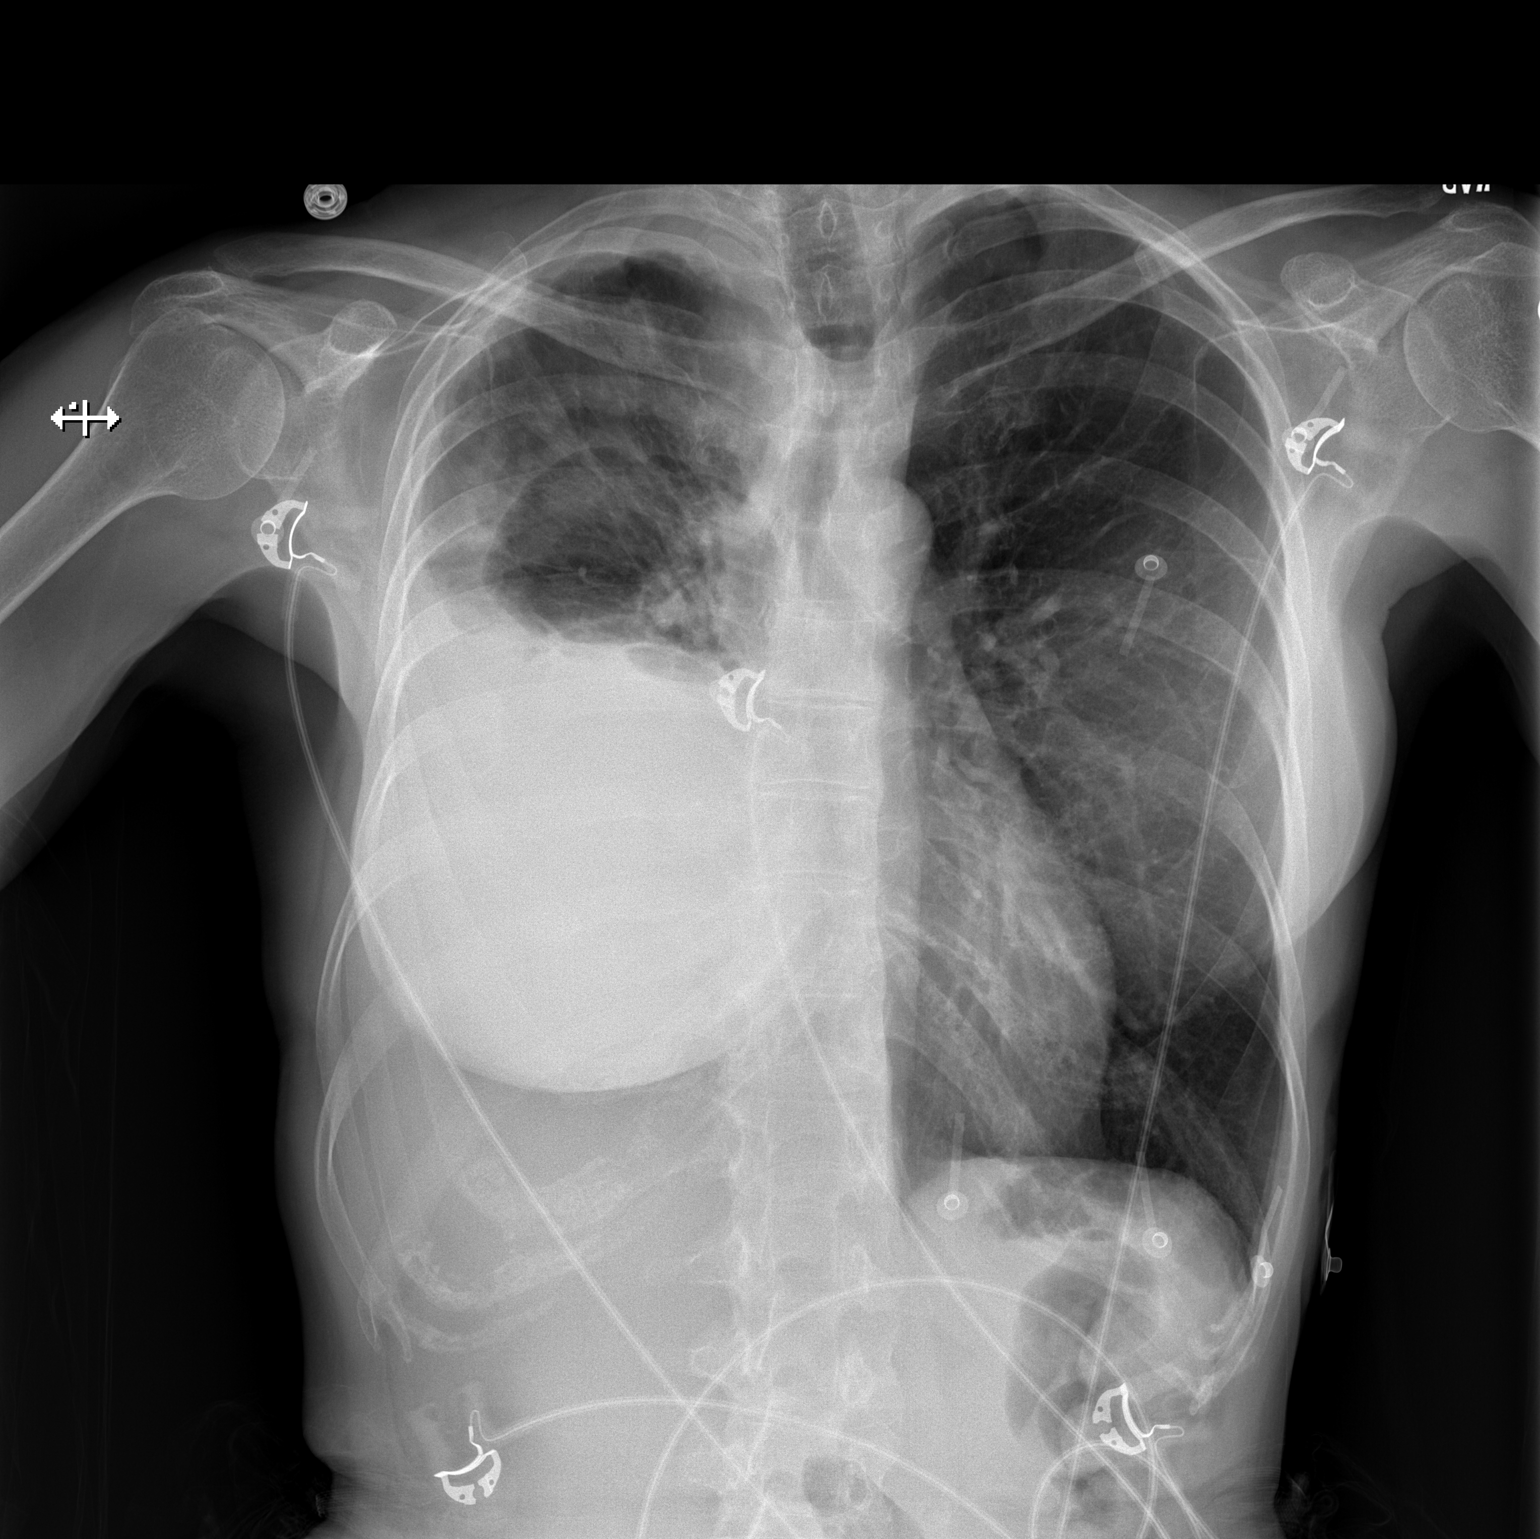

[w chest lat]
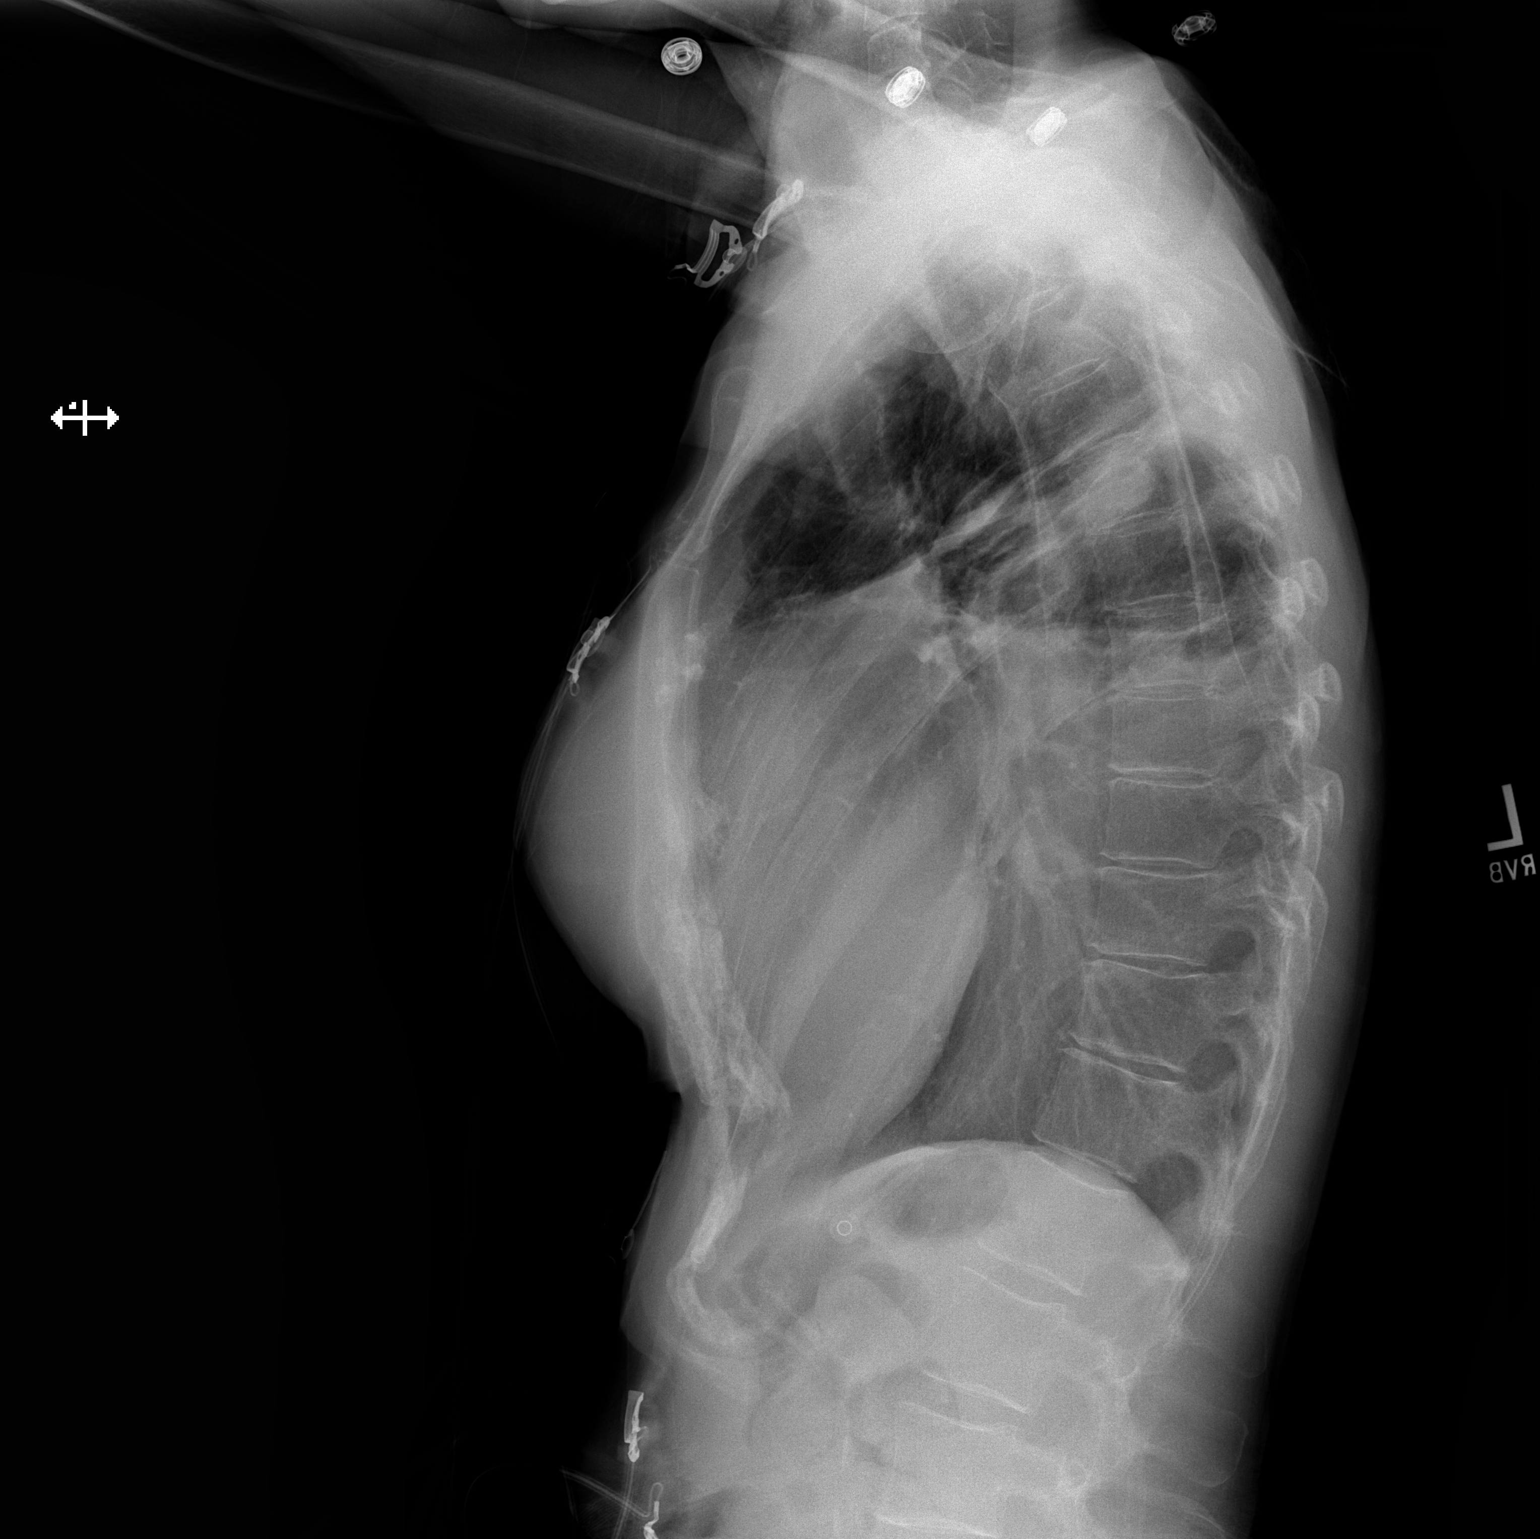

[2 of 2 positions shown; findings below may reference images not displayed]

FINDINGS: There is a large right pleural effusion and underlying opacity,
larger in the interval. Mild patchy opacity in the aerated portion
of the right upper lung is a little more prominent as well. The left
lung remains clear. No pneumothorax. No other changes.
IMPRESSION: Increasing right pleural fluid with underlying opacity. Mild patchy
opacity in the aerated portion of the right upper lung is a little
more prominent as well.

## 2017-07-20 IMAGING — DX DG CHEST 1V
1 series · 1 of 1 positions shown · non-contrast
Comparison: 07/17/2015 and 06/12/2015

CLINICAL DATA: Right pleural effusion. Status post right
thoracentesis.

EXAM:
CHEST 1 VIEW

[chest ap]
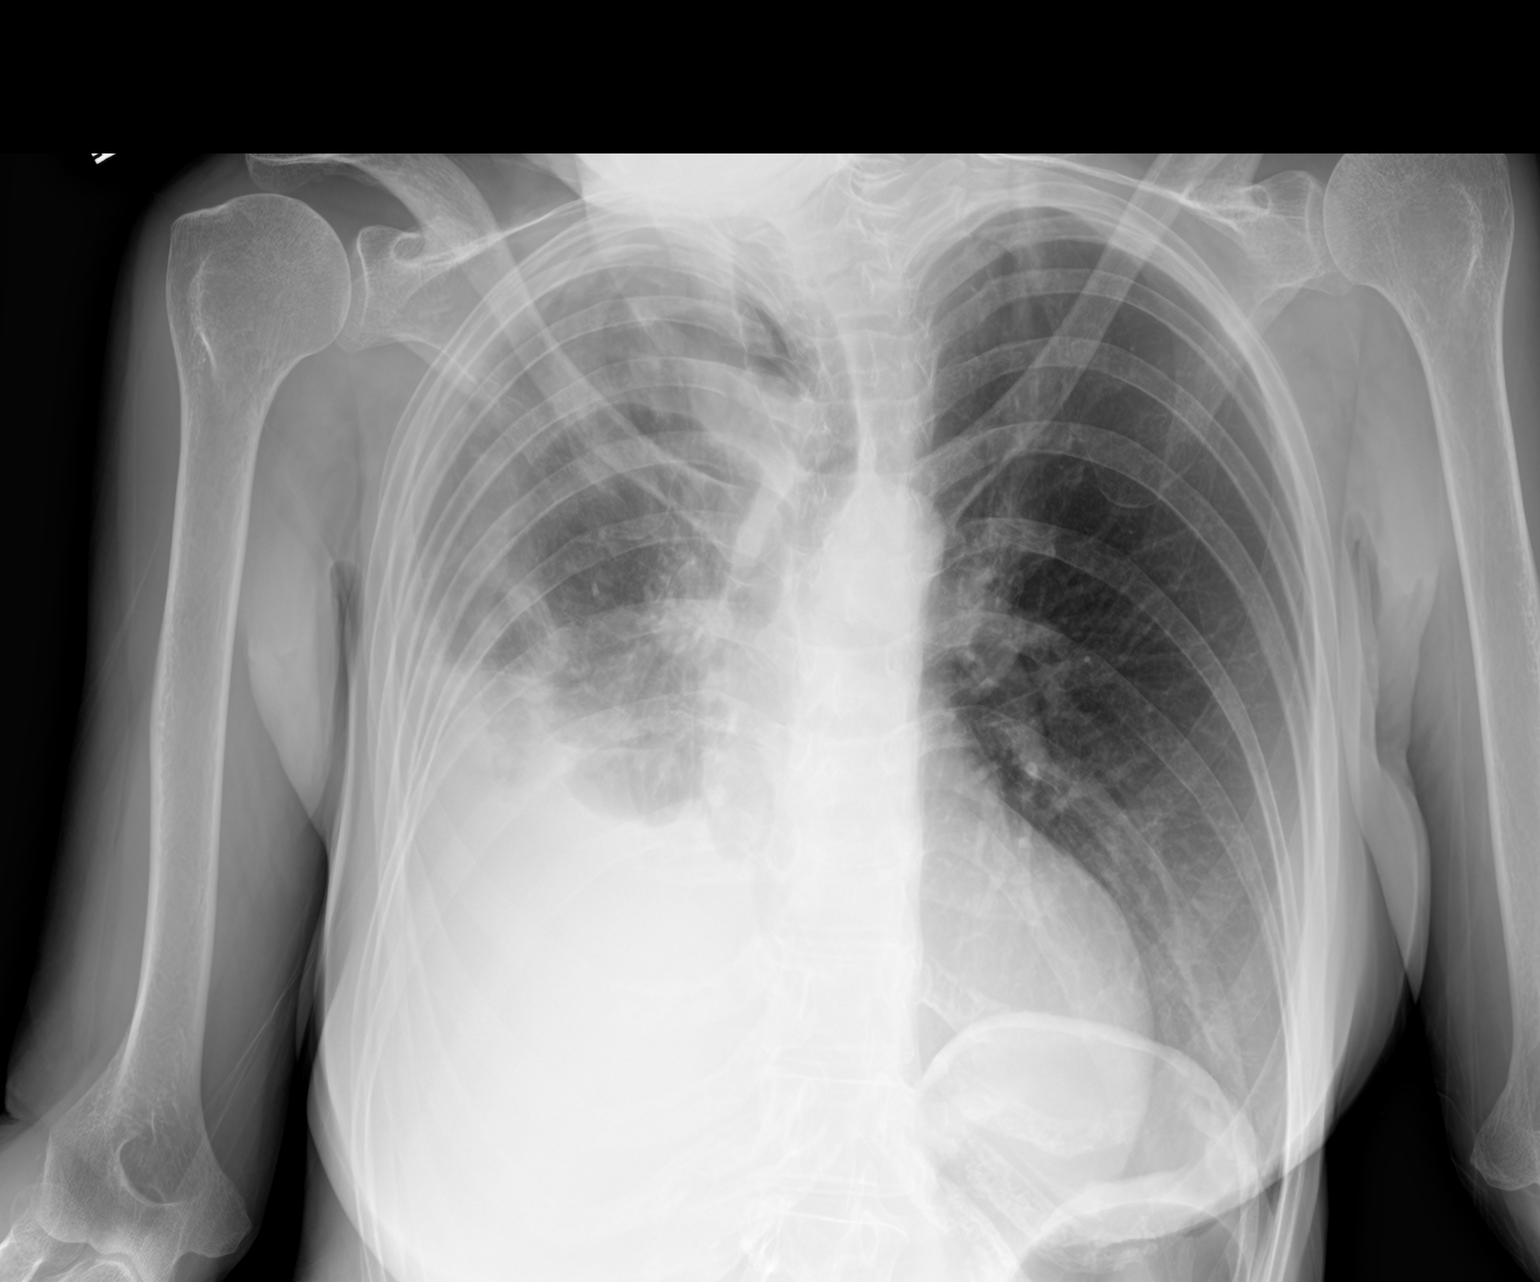

[1 of 1 positions shown; findings below may reference images not displayed]

FINDINGS: There has been a reduction in the large right pleural effusion.
Moderate effusion persists. No pneumothorax.

Left lung is clear. Heart size and pulmonary vascularity are normal.
IMPRESSION: Decreased right pleural effusion.  No pneumothorax.

## 2017-07-20 IMAGING — US US THORACENTESIS ASP PLEURAL SPACE W/IMG GUIDE
1 series · 3 of 3 positions shown · non-contrast
Comparison: none

INDICATION: History of breast cancer with metastatic disease to her lung. Recent
shortness of breath and found have a large right pleural effusion.
Request has been made for diagnostic and therapeutic right
thoracentesis.

[Series 1: us thoracentesis asp pleural space w/img guide · 0.26mm/px · 3 of 3 slices shown]
[im 1/3]
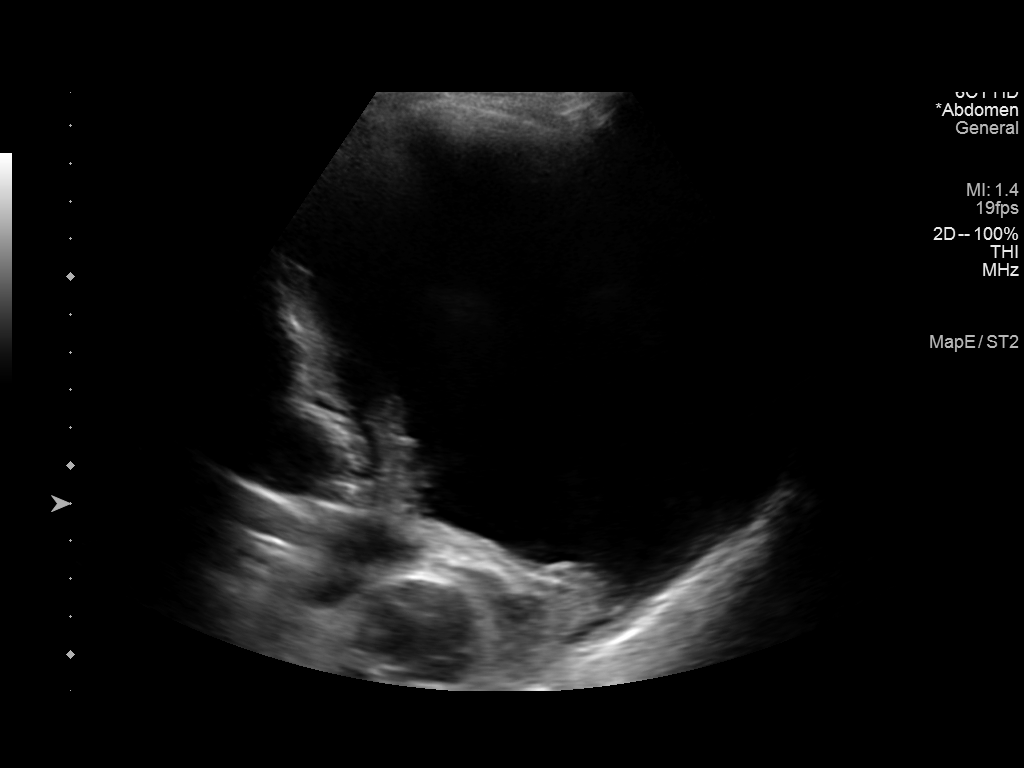
[im 2/3]
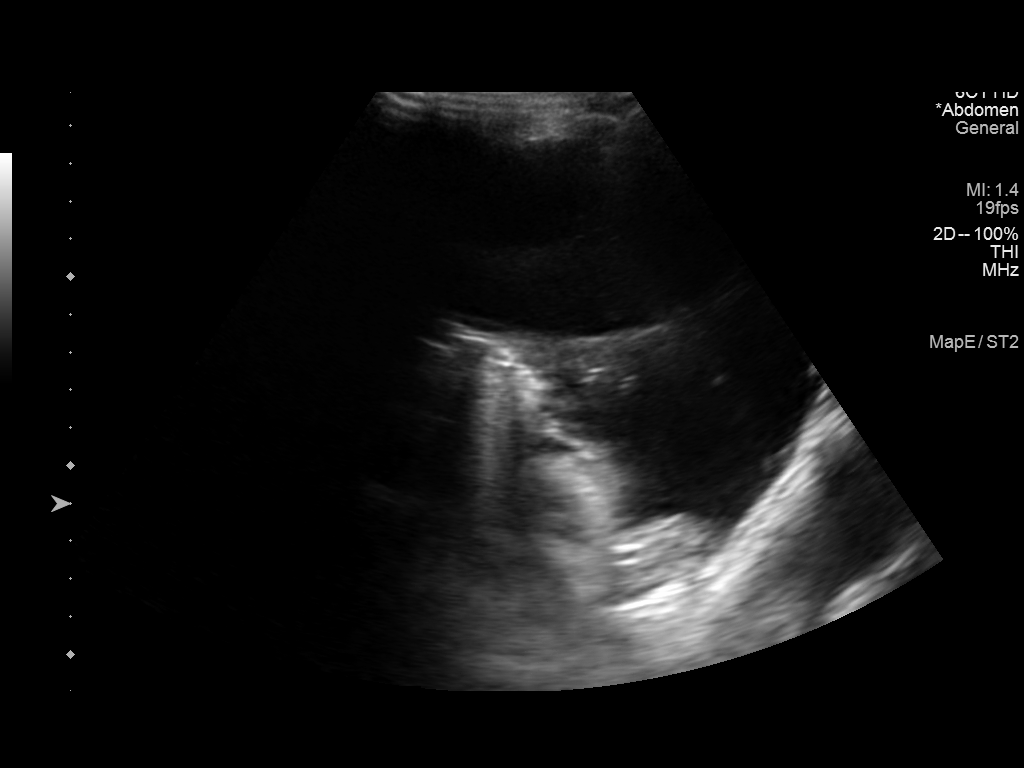
[im 3/3]
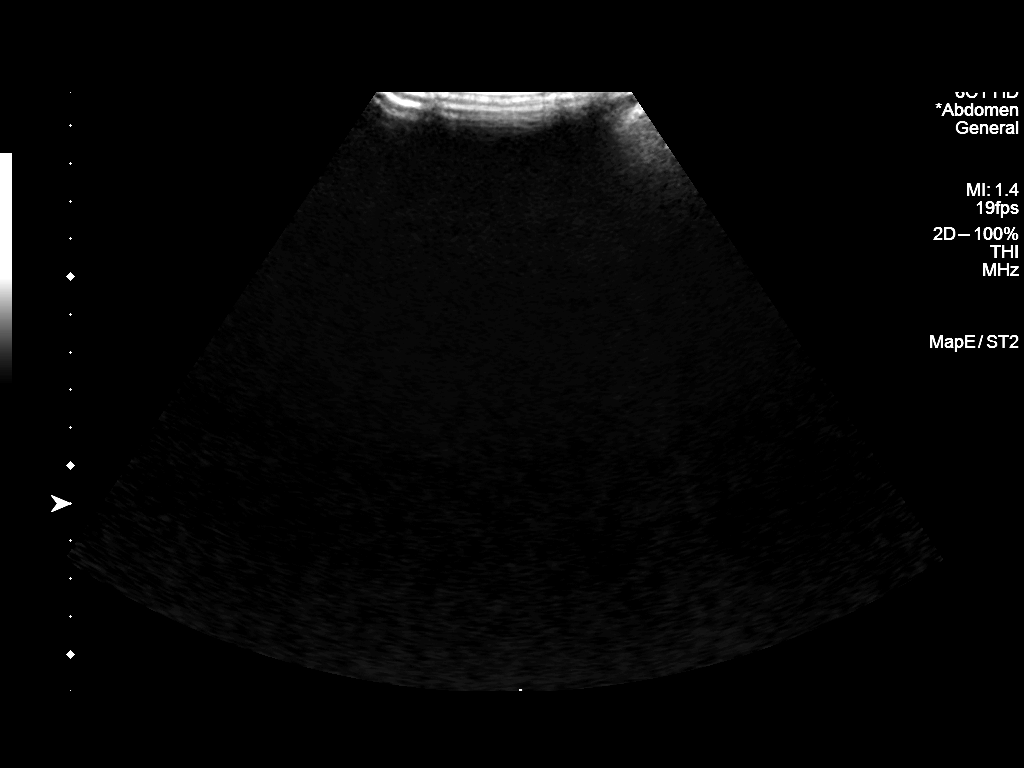

[3 of 3 positions shown; findings below may reference images not displayed]

EXAM:
ULTRASOUND GUIDED DIAGNOSTIC AND THERAPEUTIC THORACENTESIS

MEDICATIONS:
1% LIDOCAINE

COMPLICATIONS:
None immediate.

PROCEDURE:
An ultrasound guided thoracentesis was thoroughly discussed with the
patient and questions answered. The benefits, risks, alternatives
and complications were also discussed. The patient understands and
wishes to proceed with the procedure. Written consent was obtained.

Ultrasound was performed to localize and mark an adequate pocket of
fluid in the RIGHT chest. The area was then prepped and draped in
the normal sterile fashion. 1% Lidocaine was used for local
anesthesia. A Safe-T-Centesis catheter was introduced. Thoracentesis
was performed. The procedure was terminated secondary to chest pain.
There was still a fair amount of fluid left in the pleural space.
The catheter was removed and a dressing applied. She did take a dose
of tramadol 50 mg that she brought from home for pain after the
procedure. This was discussed with Dr. Favro as well.
FINDINGS: A total of approximately 1.25 L of clear yellow fluid was removed.
Samples were sent to the laboratory as requested by the clinical
team.
IMPRESSION: Successful ultrasound guided right thoracentesis yielding 1.25 L of
pleural fluid.
